# Patient Record
Sex: Female | Born: 1945
Health system: Southern US, Community
[De-identification: ages and names within clinical notes are randomized; demographics above are authoritative.]

## PROBLEM LIST (undated history)

## (undated) DIAGNOSIS — R51 Headache: Secondary | ICD-10-CM

## (undated) DIAGNOSIS — M1990 Unspecified osteoarthritis, unspecified site: Secondary | ICD-10-CM

## (undated) DIAGNOSIS — J019 Acute sinusitis, unspecified: Secondary | ICD-10-CM

## (undated) DIAGNOSIS — N951 Menopausal and female climacteric states: Secondary | ICD-10-CM

## (undated) DIAGNOSIS — R7302 Impaired glucose tolerance (oral): Secondary | ICD-10-CM

## (undated) DIAGNOSIS — M810 Age-related osteoporosis without current pathological fracture: Secondary | ICD-10-CM

## (undated) DIAGNOSIS — Z8679 Personal history of other diseases of the circulatory system: Secondary | ICD-10-CM

## (undated) DIAGNOSIS — Z86010 Personal history of colonic polyps: Secondary | ICD-10-CM

## (undated) DIAGNOSIS — F341 Dysthymic disorder: Secondary | ICD-10-CM

## (undated) DIAGNOSIS — H538 Other visual disturbances: Secondary | ICD-10-CM

## (undated) DIAGNOSIS — E785 Hyperlipidemia, unspecified: Secondary | ICD-10-CM

## (undated) DIAGNOSIS — B0239 Other herpes zoster eye disease: Secondary | ICD-10-CM

## (undated) DIAGNOSIS — T884XXA Failed or difficult intubation, initial encounter: Secondary | ICD-10-CM

## (undated) DIAGNOSIS — E7800 Pure hypercholesterolemia, unspecified: Secondary | ICD-10-CM

## (undated) DIAGNOSIS — J309 Allergic rhinitis, unspecified: Secondary | ICD-10-CM

## (undated) DIAGNOSIS — F419 Anxiety disorder, unspecified: Secondary | ICD-10-CM

## (undated) DIAGNOSIS — H409 Unspecified glaucoma: Secondary | ICD-10-CM

## (undated) DIAGNOSIS — E739 Lactose intolerance, unspecified: Secondary | ICD-10-CM

## (undated) HISTORY — DX: Hyperlipidemia, unspecified: E78.5

## (undated) HISTORY — DX: Headache: R51

## (undated) HISTORY — DX: Allergic rhinitis, unspecified: J30.9

## (undated) HISTORY — DX: Failed or difficult intubation, initial encounter: T88.4XXA

## (undated) HISTORY — DX: Unspecified osteoarthritis, unspecified site: M19.90

## (undated) HISTORY — DX: Pure hypercholesterolemia, unspecified: E78.00

## (undated) HISTORY — DX: Unspecified glaucoma: H40.9

## (undated) HISTORY — DX: Anxiety disorder, unspecified: F41.9

## (undated) HISTORY — DX: Menopausal and female climacteric states: N95.1

## (undated) HISTORY — DX: Personal history of other diseases of the circulatory system: Z86.79

## (undated) HISTORY — DX: Impaired glucose tolerance (oral): R73.02

## (undated) HISTORY — DX: Other herpes zoster eye disease: B02.39

## (undated) HISTORY — DX: Dysthymic disorder: F34.1

## (undated) HISTORY — PX: COLONOSCOPY: SHX174

## (undated) HISTORY — PX: ABDOMINAL HYSTERECTOMY: SHX81

## (undated) HISTORY — DX: Personal history of colonic polyps: Z86.010

## (undated) HISTORY — DX: Other visual disturbances: H53.8

## (undated) HISTORY — PX: POLYPECTOMY: SHX149

## (undated) HISTORY — PX: OOPHORECTOMY: SHX86

## (undated) HISTORY — DX: Age-related osteoporosis without current pathological fracture: M81.0

## (undated) HISTORY — DX: Acute sinusitis, unspecified: J01.90

## (undated) HISTORY — PX: COSMETIC SURGERY: SHX468

## (undated) HISTORY — DX: Lactose intolerance, unspecified: E73.9

## (undated) HISTORY — PX: AUGMENTATION MAMMAPLASTY: SUR837

---

## 1998-05-07 ENCOUNTER — Other Ambulatory Visit: Admission: RE | Admit: 1998-05-07 | Discharge: 1998-05-07 | Payer: Self-pay | Admitting: Obstetrics and Gynecology

## 1998-08-25 ENCOUNTER — Ambulatory Visit: Admission: RE | Admit: 1998-08-25 | Discharge: 1998-08-25 | Payer: Self-pay | Admitting: Internal Medicine

## 1998-10-23 ENCOUNTER — Ambulatory Visit: Admission: RE | Admit: 1998-10-23 | Discharge: 1998-10-23 | Payer: Self-pay | Admitting: Internal Medicine

## 1998-10-23 ENCOUNTER — Encounter: Payer: Self-pay | Admitting: Internal Medicine

## 1999-06-26 ENCOUNTER — Other Ambulatory Visit: Admission: RE | Admit: 1999-06-26 | Discharge: 1999-06-26 | Payer: Self-pay | Admitting: Obstetrics and Gynecology

## 2000-03-23 ENCOUNTER — Encounter: Payer: Self-pay | Admitting: Internal Medicine

## 2000-03-23 ENCOUNTER — Encounter: Admission: RE | Admit: 2000-03-23 | Discharge: 2000-03-23 | Payer: Self-pay | Admitting: Internal Medicine

## 2000-05-24 ENCOUNTER — Encounter: Payer: Self-pay | Admitting: Emergency Medicine

## 2000-05-24 ENCOUNTER — Emergency Department (HOSPITAL_COMMUNITY): Admission: EM | Admit: 2000-05-24 | Discharge: 2000-05-24 | Payer: Self-pay | Admitting: Emergency Medicine

## 2000-07-14 ENCOUNTER — Encounter: Admission: RE | Admit: 2000-07-14 | Discharge: 2000-10-12 | Payer: Self-pay | Admitting: Orthopedic Surgery

## 2000-08-31 ENCOUNTER — Other Ambulatory Visit: Admission: RE | Admit: 2000-08-31 | Discharge: 2000-08-31 | Payer: Self-pay | Admitting: *Deleted

## 2000-10-13 ENCOUNTER — Encounter: Admission: RE | Admit: 2000-10-13 | Discharge: 2000-11-15 | Payer: Self-pay | Admitting: Orthopedic Surgery

## 2000-11-28 ENCOUNTER — Encounter: Admission: RE | Admit: 2000-11-28 | Discharge: 2001-01-25 | Payer: Self-pay | Admitting: Orthopedic Surgery

## 2001-04-20 ENCOUNTER — Encounter: Payer: Self-pay | Admitting: Obstetrics and Gynecology

## 2001-04-20 ENCOUNTER — Encounter: Admission: RE | Admit: 2001-04-20 | Discharge: 2001-04-20 | Payer: Self-pay | Admitting: Obstetrics and Gynecology

## 2001-11-10 ENCOUNTER — Other Ambulatory Visit: Admission: RE | Admit: 2001-11-10 | Discharge: 2001-11-10 | Payer: Self-pay | Admitting: Obstetrics and Gynecology

## 2001-12-24 ENCOUNTER — Encounter: Payer: Self-pay | Admitting: Emergency Medicine

## 2001-12-24 ENCOUNTER — Emergency Department (HOSPITAL_COMMUNITY): Admission: EM | Admit: 2001-12-24 | Discharge: 2001-12-24 | Payer: Self-pay | Admitting: Emergency Medicine

## 2002-08-30 ENCOUNTER — Encounter: Admission: RE | Admit: 2002-08-30 | Discharge: 2002-08-30 | Payer: Self-pay | Admitting: Obstetrics and Gynecology

## 2002-08-30 ENCOUNTER — Encounter: Payer: Self-pay | Admitting: Obstetrics and Gynecology

## 2002-10-13 ENCOUNTER — Encounter: Payer: Self-pay | Admitting: Internal Medicine

## 2002-10-13 ENCOUNTER — Encounter: Admission: RE | Admit: 2002-10-13 | Discharge: 2002-10-13 | Payer: Self-pay | Admitting: Internal Medicine

## 2002-11-27 ENCOUNTER — Other Ambulatory Visit: Admission: RE | Admit: 2002-11-27 | Discharge: 2002-11-27 | Payer: Self-pay | Admitting: Obstetrics and Gynecology

## 2003-09-02 ENCOUNTER — Encounter: Admission: RE | Admit: 2003-09-02 | Discharge: 2003-09-02 | Payer: Self-pay | Admitting: Obstetrics and Gynecology

## 2003-09-02 ENCOUNTER — Encounter: Payer: Self-pay | Admitting: Obstetrics and Gynecology

## 2004-01-13 ENCOUNTER — Other Ambulatory Visit: Admission: RE | Admit: 2004-01-13 | Discharge: 2004-01-13 | Payer: Self-pay | Admitting: Obstetrics and Gynecology

## 2004-12-01 ENCOUNTER — Ambulatory Visit: Payer: Self-pay | Admitting: Internal Medicine

## 2005-02-09 ENCOUNTER — Other Ambulatory Visit: Admission: RE | Admit: 2005-02-09 | Discharge: 2005-02-09 | Payer: Self-pay | Admitting: Obstetrics and Gynecology

## 2005-02-09 ENCOUNTER — Ambulatory Visit (HOSPITAL_COMMUNITY): Admission: RE | Admit: 2005-02-09 | Discharge: 2005-02-09 | Payer: Self-pay | Admitting: Obstetrics and Gynecology

## 2005-02-15 ENCOUNTER — Ambulatory Visit (HOSPITAL_COMMUNITY): Admission: RE | Admit: 2005-02-15 | Discharge: 2005-02-15 | Payer: Self-pay | Admitting: Obstetrics and Gynecology

## 2005-05-13 ENCOUNTER — Ambulatory Visit: Payer: Self-pay | Admitting: Internal Medicine

## 2005-09-09 ENCOUNTER — Ambulatory Visit: Payer: Self-pay | Admitting: Internal Medicine

## 2005-09-14 ENCOUNTER — Ambulatory Visit: Payer: Self-pay | Admitting: Cardiology

## 2005-11-08 HISTORY — PX: DILATION AND CURETTAGE OF UTERUS: SHX78

## 2005-12-07 ENCOUNTER — Ambulatory Visit (HOSPITAL_COMMUNITY): Admission: RE | Admit: 2005-12-07 | Discharge: 2005-12-07 | Payer: Self-pay | Admitting: Obstetrics and Gynecology

## 2005-12-07 ENCOUNTER — Encounter (INDEPENDENT_AMBULATORY_CARE_PROVIDER_SITE_OTHER): Payer: Self-pay | Admitting: *Deleted

## 2006-01-04 ENCOUNTER — Ambulatory Visit: Payer: Self-pay | Admitting: Internal Medicine

## 2006-01-18 ENCOUNTER — Ambulatory Visit: Payer: Self-pay | Admitting: Internal Medicine

## 2006-01-18 ENCOUNTER — Encounter (INDEPENDENT_AMBULATORY_CARE_PROVIDER_SITE_OTHER): Payer: Self-pay | Admitting: Specialist

## 2006-02-10 ENCOUNTER — Ambulatory Visit (HOSPITAL_COMMUNITY): Admission: RE | Admit: 2006-02-10 | Discharge: 2006-02-10 | Payer: Self-pay | Admitting: Obstetrics and Gynecology

## 2006-02-18 ENCOUNTER — Other Ambulatory Visit: Admission: RE | Admit: 2006-02-18 | Discharge: 2006-02-18 | Payer: Self-pay | Admitting: Obstetrics and Gynecology

## 2006-04-20 ENCOUNTER — Ambulatory Visit (HOSPITAL_COMMUNITY): Admission: RE | Admit: 2006-04-20 | Discharge: 2006-04-21 | Payer: Self-pay | Admitting: Obstetrics and Gynecology

## 2006-04-20 ENCOUNTER — Encounter (INDEPENDENT_AMBULATORY_CARE_PROVIDER_SITE_OTHER): Payer: Self-pay | Admitting: Specialist

## 2006-06-01 ENCOUNTER — Ambulatory Visit (HOSPITAL_COMMUNITY): Admission: RE | Admit: 2006-06-01 | Discharge: 2006-06-01 | Payer: Self-pay | Admitting: Internal Medicine

## 2006-06-01 ENCOUNTER — Ambulatory Visit: Payer: Self-pay | Admitting: Internal Medicine

## 2007-01-18 ENCOUNTER — Ambulatory Visit: Payer: Self-pay | Admitting: Internal Medicine

## 2007-02-28 ENCOUNTER — Ambulatory Visit (HOSPITAL_COMMUNITY): Admission: RE | Admit: 2007-02-28 | Discharge: 2007-02-28 | Payer: Self-pay | Admitting: Obstetrics and Gynecology

## 2007-08-21 ENCOUNTER — Ambulatory Visit (HOSPITAL_COMMUNITY): Admission: RE | Admit: 2007-08-21 | Discharge: 2007-08-21 | Payer: Self-pay | Admitting: Obstetrics and Gynecology

## 2007-08-30 ENCOUNTER — Encounter: Payer: Self-pay | Admitting: *Deleted

## 2007-08-30 DIAGNOSIS — Z8679 Personal history of other diseases of the circulatory system: Secondary | ICD-10-CM | POA: Insufficient documentation

## 2007-08-30 DIAGNOSIS — F341 Dysthymic disorder: Secondary | ICD-10-CM | POA: Insufficient documentation

## 2007-08-30 DIAGNOSIS — N951 Menopausal and female climacteric states: Secondary | ICD-10-CM

## 2007-08-30 DIAGNOSIS — E7800 Pure hypercholesterolemia, unspecified: Secondary | ICD-10-CM | POA: Insufficient documentation

## 2007-08-30 HISTORY — DX: Dysthymic disorder: F34.1

## 2007-08-30 HISTORY — DX: Menopausal and female climacteric states: N95.1

## 2007-08-30 HISTORY — DX: Personal history of other diseases of the circulatory system: Z86.79

## 2007-08-30 HISTORY — DX: Pure hypercholesterolemia, unspecified: E78.00

## 2007-12-22 ENCOUNTER — Encounter: Payer: Self-pay | Admitting: Internal Medicine

## 2007-12-22 ENCOUNTER — Ambulatory Visit: Payer: Self-pay | Admitting: Internal Medicine

## 2007-12-22 DIAGNOSIS — R51 Headache: Secondary | ICD-10-CM

## 2007-12-22 DIAGNOSIS — Z860100 Personal history of colon polyps, unspecified: Secondary | ICD-10-CM

## 2007-12-22 DIAGNOSIS — M810 Age-related osteoporosis without current pathological fracture: Secondary | ICD-10-CM

## 2007-12-22 DIAGNOSIS — J309 Allergic rhinitis, unspecified: Secondary | ICD-10-CM | POA: Insufficient documentation

## 2007-12-22 DIAGNOSIS — H539 Unspecified visual disturbance: Secondary | ICD-10-CM | POA: Insufficient documentation

## 2007-12-22 DIAGNOSIS — H538 Other visual disturbances: Secondary | ICD-10-CM | POA: Insufficient documentation

## 2007-12-22 DIAGNOSIS — R519 Headache, unspecified: Secondary | ICD-10-CM | POA: Insufficient documentation

## 2007-12-22 HISTORY — DX: Headache: R51

## 2007-12-22 HISTORY — DX: Other visual disturbances: H53.8

## 2007-12-22 HISTORY — DX: Personal history of colon polyps, unspecified: Z86.0100

## 2007-12-22 HISTORY — DX: Allergic rhinitis, unspecified: J30.9

## 2007-12-22 HISTORY — DX: Personal history of colonic polyps: Z86.010

## 2007-12-22 HISTORY — DX: Age-related osteoporosis without current pathological fracture: M81.0

## 2007-12-30 ENCOUNTER — Encounter: Admission: RE | Admit: 2007-12-30 | Discharge: 2007-12-30 | Payer: Self-pay | Admitting: Internal Medicine

## 2008-03-19 ENCOUNTER — Ambulatory Visit: Payer: Self-pay | Admitting: Internal Medicine

## 2008-03-19 ENCOUNTER — Ambulatory Visit (HOSPITAL_COMMUNITY): Admission: RE | Admit: 2008-03-19 | Discharge: 2008-03-19 | Payer: Self-pay | Admitting: Obstetrics and Gynecology

## 2008-03-19 LAB — CONVERTED CEMR LAB
Albumin: 3.5 g/dL (ref 3.5–5.2)
Alkaline Phosphatase: 46 units/L (ref 39–117)
Basophils Absolute: 0.1 10*3/uL (ref 0.0–0.1)
CO2: 30 meq/L (ref 19–32)
Calcium: 9 mg/dL (ref 8.4–10.5)
Chloride: 107 meq/L (ref 96–112)
Direct LDL: 118.2 mg/dL
Eosinophils Absolute: 0.1 10*3/uL (ref 0.0–0.7)
GFR calc Af Amer: 110 mL/min
Glucose, Bld: 94 mg/dL (ref 70–99)
HCT: 37.5 % (ref 36.0–46.0)
Hemoglobin, Urine: NEGATIVE
Lymphocytes Relative: 34.1 % (ref 12.0–46.0)
Monocytes Absolute: 0.4 10*3/uL (ref 0.1–1.0)
Neutro Abs: 2.3 10*3/uL (ref 1.4–7.7)
Platelets: 293 10*3/uL (ref 150–400)
Potassium: 5.5 meq/L — ABNORMAL HIGH (ref 3.5–5.1)
RDW: 12.5 % (ref 11.5–14.6)
Specific Gravity, Urine: 1.025 (ref 1.000–1.03)
TSH: 3.9 microintl units/mL (ref 0.35–5.50)
Total Protein: 6.7 g/dL (ref 6.0–8.3)
VLDL: 13 mg/dL (ref 0–40)
WBC: 4.4 10*3/uL — ABNORMAL LOW (ref 4.5–10.5)
pH: 6 (ref 5.0–8.0)

## 2008-03-26 ENCOUNTER — Ambulatory Visit: Payer: Self-pay | Admitting: Internal Medicine

## 2008-03-26 DIAGNOSIS — E785 Hyperlipidemia, unspecified: Secondary | ICD-10-CM

## 2008-03-26 HISTORY — DX: Hyperlipidemia, unspecified: E78.5

## 2008-06-17 ENCOUNTER — Ambulatory Visit: Payer: Self-pay | Admitting: Internal Medicine

## 2008-12-19 ENCOUNTER — Telehealth (INDEPENDENT_AMBULATORY_CARE_PROVIDER_SITE_OTHER): Payer: Self-pay | Admitting: *Deleted

## 2009-03-05 ENCOUNTER — Encounter: Payer: Self-pay | Admitting: Internal Medicine

## 2009-03-25 ENCOUNTER — Ambulatory Visit (HOSPITAL_COMMUNITY): Admission: RE | Admit: 2009-03-25 | Discharge: 2009-03-25 | Payer: Self-pay | Admitting: Obstetrics and Gynecology

## 2009-04-10 ENCOUNTER — Encounter: Payer: Self-pay | Admitting: Internal Medicine

## 2009-07-08 ENCOUNTER — Ambulatory Visit: Payer: Self-pay | Admitting: Internal Medicine

## 2009-07-08 DIAGNOSIS — E739 Lactose intolerance, unspecified: Secondary | ICD-10-CM | POA: Insufficient documentation

## 2009-07-08 HISTORY — DX: Lactose intolerance, unspecified: E73.9

## 2009-09-10 ENCOUNTER — Encounter: Admission: RE | Admit: 2009-09-10 | Discharge: 2009-11-05 | Payer: Self-pay | Admitting: Internal Medicine

## 2009-10-14 ENCOUNTER — Encounter: Payer: Self-pay | Admitting: Internal Medicine

## 2009-11-17 ENCOUNTER — Ambulatory Visit: Payer: Self-pay | Admitting: Internal Medicine

## 2009-11-17 LAB — CONVERTED CEMR LAB
ALT: 15 units/L (ref 0–35)
Albumin: 3.6 g/dL (ref 3.5–5.2)
Bilirubin Urine: NEGATIVE
Cholesterol: 213 mg/dL — ABNORMAL HIGH (ref 0–200)
Direct LDL: 117.5 mg/dL
Eosinophils Relative: 1.4 % (ref 0.0–5.0)
GFR calc non Af Amer: 90.35 mL/min (ref >60)
Hemoglobin, Urine: NEGATIVE
Leukocytes, UA: NEGATIVE
Lymphocytes Relative: 28.4 % (ref 12.0–46.0)
MCHC: 34.2 g/dL (ref 30.0–36.0)
MCV: 103.5 fL — ABNORMAL HIGH (ref 78.0–100.0)
Monocytes Absolute: 0.4 10*3/uL (ref 0.1–1.0)
Monocytes Relative: 8 % (ref 3.0–12.0)
Neutrophils Relative %: 61.3 % (ref 43.0–77.0)
Platelets: 276 10*3/uL (ref 150.0–400.0)
Potassium: 4.5 meq/L (ref 3.5–5.1)
RBC: 3.67 M/uL — ABNORMAL LOW (ref 3.87–5.11)
RDW: 12.1 % (ref 11.5–14.6)
Sodium: 139 meq/L (ref 135–145)
Specific Gravity, Urine: 1.015 (ref 1.000–1.030)
Total CHOL/HDL Ratio: 3
Total Protein, Urine: NEGATIVE mg/dL
Total Protein: 6.7 g/dL (ref 6.0–8.3)
Urine Glucose: NEGATIVE mg/dL
VLDL: 19 mg/dL (ref 0.0–40.0)
pH: 7 (ref 5.0–8.0)

## 2010-02-06 ENCOUNTER — Ambulatory Visit: Payer: Self-pay | Admitting: Internal Medicine

## 2010-02-06 DIAGNOSIS — J019 Acute sinusitis, unspecified: Secondary | ICD-10-CM

## 2010-02-06 HISTORY — DX: Acute sinusitis, unspecified: J01.90

## 2010-03-31 ENCOUNTER — Ambulatory Visit (HOSPITAL_COMMUNITY): Admission: RE | Admit: 2010-03-31 | Discharge: 2010-03-31 | Payer: Self-pay | Admitting: Obstetrics and Gynecology

## 2010-09-07 ENCOUNTER — Telehealth: Payer: Self-pay | Admitting: Internal Medicine

## 2010-12-08 NOTE — Assessment & Plan Note (Signed)
Summary: ?laryngitis/headache,stuffy head/cd   Vital Signs:  Patient profile:   65 year old female Height:      63.5 inches Weight:      124 pounds BMI:     21.70 O2 Sat:      98 % on Room air Temp:     98.6 degrees F oral Pulse rate:   84 / minute BP sitting:   110 / 64  (left arm) Cuff size:   regular  Vitals Entered ByZella Ball Ewing (February 06, 2010 11:29 AM)  O2 Flow:  Room air CC: Headache, nasal congestion, Hoarseness/RE   CC:  Headache, nasal congestion, and Hoarseness/RE.  History of Present Illness: here with acute onset mild to mod fever, ST, sinus pain and pressure, greenish d/c for 3 days; some mild cough nonprod but Pt denies CP, sob, doe, wheezing, orthopnea, pnd, worsening LE edema, palps, dizziness or syncope   Problems Prior to Update: 1)  Sinusitis- Acute-nos  (ICD-461.9) 2)  Glucose Intolerance  (ICD-271.3) 3)  Hyperlipidemia  (ICD-272.4) 4)  Preventive Health Care  (ICD-V70.0) 5)  Colonic Polyps, Hx of  (ICD-V12.72) 6)  Allergic Rhinitis  (ICD-477.9) 7)  Osteoporosis  (ICD-733.00) 8)  Blurred Vision, Intermittent  (ICD-368.8) 9)  Headache  (ICD-784.0) 10)  Hypercholesterolemia  (ICD-272.0) 11)  Postmenopausal Status  (ICD-627.2) 12)  Mitral Valve Prolapse, Hx of  (ICD-V12.50) 13)  Anxiety Depression  (ICD-300.4)  Medications Prior to Update: 1)  Alprazolam 0.5 Mg Tabs (Alprazolam) .... Take 1 Tablet By Mouth Twice A Day As Needed 2)  Citalopram Hydrobromide 40 Mg Tabs (Citalopram Hydrobromide) .... Take 1 Tablet By Mouth Once A Day 3)  Premarin 0.3 Mg Tabs (Estrogens Conjugated) .Marland Kitchen.. 1 By Mouth Once Daily  Current Medications (verified): 1)  Alprazolam 0.5 Mg Tabs (Alprazolam) .... Take 1 Tablet By Mouth Twice A Day As Needed 2)  Citalopram Hydrobromide 40 Mg Tabs (Citalopram Hydrobromide) .... Take 1 Tablet By Mouth Once A Day 3)  Premarin 0.3 Mg Tabs (Estrogens Conjugated) .Marland Kitchen.. 1 By Mouth Once Daily 4)  Clarithromycin 500 Mg Tabs (Clarithromycin)  .Marland Kitchen.. 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 , mild ill  Head:  normocephalic and atraumatic.   Eyes:  vision grossly intact, pupils equal, and pupils round.   Ears:  bilat tm's red , sinus marked tender bilat Nose:  nasal dischargemucosal pallor and mucosal edema.   Mouth:  pharyngeal erythema and fair dentition.   Neck:  supple and no masses.   Lungs:  normal respiratory effort and normal breath sounds.   Heart:  normal rate and regular rhythm.   Extremities:  no edema, no erythema    Impression & Recommendations:  Problem # 1:  SINUSITIS- ACUTE-NOS (ICD-461.9)  Her updated medication list for this problem includes:    Clarithromycin 500 Mg Tabs (Clarithromycin) .Marland Kitchen... 1po two times a day treat as above, f/u any worsening signs or symptoms  Complete Medication List: 1)  Alprazolam 0.5 Mg Tabs (Alprazolam) .... Take 1 tablet by mouth twice a day as needed 2)  Citalopram Hydrobromide 40 Mg Tabs (Citalopram hydrobromide) .... Take 1 tablet by mouth once a day 3)  Premarin 0.3 Mg Tabs (Estrogens conjugated) .Marland Kitchen.. 1 by mouth once daily 4)  Clarithromycin 500 Mg Tabs (Clarithromycin) .Marland Kitchen.. 1po two times a day  Patient Instructions: 1)  Please take all new medications as prescribed  2)  Continue all previous  medications as before this visit  3)  You can also use Mucinex OTC or it's generic for congestion  4)  You can also take delsym OTC as needed  5)  Please schedule a follow-up appointment as needed. Prescriptions: CLARITHROMYCIN 500 MG TABS (CLARITHROMYCIN) 1po two times a day  #20 x 0   Entered and Authorized by:   Corwin Levins MD   Signed by:   Corwin Levins MD on 02/06/2010   Method used:   Print then Give to Patient   RxID:   [id number]

## 2010-12-08 NOTE — Assessment & Plan Note (Signed)
Summary: 4 mos f/u $50 cd   Vital Signs:  Patient profile:   65 year old female Height:      64 inches Weight:      122 pounds BMI:     21.02 O2 Sat:      99 % on Room air Temp:     97.6 degrees F oral Pulse rate:   64 / minute BP sitting:   112 / 70  (left arm) Cuff size:   regular  Vitals Entered ByZella Ball Ewing (November 17, 2009 8:15 AM)  O2 Flow:  Room air  CC: 4 Mo followup/Re   CC:  4 Mo followup/Re.  History of Present Illness: did go to dietary clinic and doing much better wit diet;  lost 6 lbs intendtionally over the past year but now stabilized it seems;  sugars at home have been in the lower 100's;  watched her diet carefully over the holidays;  Pt denies polydipsia, polyuria, or low sugar symptoms such as shakiness improved with eating.  Overall good compliance with meds, trying to follow low chol, DM diet, wt stable, little excercise however .  Pt denies CP, sob, doe, wheezing, orthopnea, pnd, worsening LE edema, palps, dizziness or syncope   Pt denies new neuro symptoms such as headache, facial or extremity weakness  More stress  at work [employer].  Had some GI upset better with pepto bismoml but no other problems.    Problems Prior to Update: 1)  Glucose Intolerance  (ICD-271.3) 2)  Hyperlipidemia  (ICD-272.4) 3)  Preventive Health Care  (ICD-V70.0) 4)  Colonic Polyps, Hx of  (ICD-V12.72) 5)  Allergic Rhinitis  (ICD-477.9) 6)  Osteoporosis  (ICD-733.00) 7)  Blurred Vision, Intermittent  (ICD-368.8) 8)  Headache  (ICD-784.0) 9)  Hypercholesterolemia  (ICD-272.0) 10)  Postmenopausal Status  (ICD-627.2) 11)  Mitral Valve Prolapse, Hx of  (ICD-V12.50) 12)  Anxiety Depression  (ICD-300.4)  Medications Prior to Update: 1)  Alprazolam 0.5 Mg Tabs (Alprazolam) .... Take 1 Tablet By Mouth Twice A Day As Needed 2)  Citalopram Hydrobromide 40 Mg Tabs (Citalopram Hydrobromide) .... Take 1 Tablet By Mouth Once A Day 3)  Premarin 0.3 Mg Tabs (Estrogens Conjugated) .Marland Kitchen.. 1  By Mouth Once Daily  Current Medications (verified): 1)  Alprazolam 0.5 Mg Tabs (Alprazolam) .... Take 1 Tablet By Mouth Twice A Day As Needed 2)  Citalopram Hydrobromide 40 Mg Tabs (Citalopram Hydrobromide) .... Take 1 Tablet By Mouth Once A Day 3)  Premarin 0.3 Mg Tabs (Estrogens Conjugated) .Marland Kitchen.. 1 By Mouth Once Daily  Allergies (verified): No Known Drug Allergies  Past History:  Past Medical History: Last updated: 07/08/2009 HYPERCHOLESTEROLEMIA (ICD-272.0) POSTMENOPAUSAL STATUS (ICD-627.2) MITRAL VALVE PROLAPSE, HX OF (ICD-V12.50) ANXIETY DEPRESSION (ICD-300.4) Osteoporosis Allergic rhinitis Colonic polyps, hx of glucose intolerance  Past Surgical History: Last updated: 03/26/2008 s/p D&C 2007 Hysterectomy Oophorectomy  Family History: Last updated: 07/08/2009 mother with heart disease grandmother with breast cancer mother with DM, elev cholesterol father with alcoholism son with testicular cancer sister with breast cancer  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         all otherwise negative per pt - 12 system review   Physical Exam  General:  alert and well-developed.   Head:  normocephalic and  atraumatic.   Eyes:  vision grossly intact, pupils equal, and pupils round.   Ears:  R ear normal and L ear normal.   Nose:  no external deformity and no nasal discharge.   Mouth:  no gingival abnormalities and pharynx pink and moist.   Neck:  supple and no masses.   Lungs:  normal respiratory effort and normal breath sounds.   Heart:  normal rate and regular rhythm.   Abdomen:  soft, non-tender, and normal bowel sounds.   Msk:  no joint tenderness and no joint swelling.   Extremities:  no edema, no erythema  Neurologic:  cranial nerves II-XII intact and strength normal in all extremities.     Impression & Recommendations:  Problem # 1:  PREVENTIVE HEALTH CARE (ICD-V70.0) Overall doing well, up to date, counseled on routine health concerns for screening and prevention, immunizations up to date or declined, labs ordered. due for tetanu shot today Orders: TLB-BMP (Basic Metabolic Panel-BMET) (80048-METABOL) TLB-CBC Platelet - w/Differential (85025-CBCD) TLB-Hepatic/Liver Function Pnl (80076-HEPATIC) TLB-Lipid Panel (80061-LIPID) TLB-TSH (Thyroid Stimulating Hormone) (84443-TSH) TLB-Udip ONLY (81003-UDIP)  Problem # 2:  GLUCOSE INTOLERANCE (ICD-271.3)  Orders: TLB-A1C / Hgb A1C (Glycohemoglobin) (83036-A1C) asympt - for a1c check today  Problem # 3:  OSTEOPOROSIS (ICD-733.00)  Orders: T-Vitamin D (25-Hydroxy) ([address]) T-Parathyroid Hormone, Intact w/ Calcium (09811-91478) to check labs, due for dxa soon as well  Problem # 4:  ANXIETY DEPRESSION (ICD-300.4) stable overall by hx and exam, ok to continue meds/tx as is   Complete Medication List: 1)  Alprazolam 0.5 Mg Tabs (Alprazolam) .... Take 1 tablet by mouth twice a day as needed 2)  Citalopram Hydrobromide 40 Mg Tabs (Citalopram hydrobromide) .... Take 1 tablet by mouth once a day 3)  Premarin 0.3 Mg Tabs (Estrogens conjugated) .Marland Kitchen.. 1 by mouth once daily  Other Orders: Tdap => 20yrs IM (29562) Admin 1st Vaccine  (13086)  Patient Instructions: 1)  you had the tetanus shot today 2)  Please go to the Lab in the basement for your blood and/or urine tests today 3)  Continue all previous medications as before this visit  4)  Please schedule a follow-up appointment in 1 year or sooner if needed Prescriptions: ALPRAZOLAM 0.5 MG TABS (ALPRAZOLAM) Take 1 tablet by mouth twice a day as needed  #60 x 5   Entered and Authorized by:   Corwin Levins MD   Signed by:   Corwin Levins MD on 11/17/2009   Method used:   Print then Give to Patient   RxID:   [id number] CITALOPRAM HYDROBROMIDE 40 MG TABS (CITALOPRAM HYDROBROMIDE) Take 1 tablet by mouth once a day  #90 x 3   Entered and Authorized by:   Corwin Levins MD   Signed by:   Corwin Levins MD on 11/17/2009   Method used:   Print then Give to Patient   RxID:   [id number]    Immunizations Administered:  Tetanus Vaccine:    Vaccine Type: Tdap    Site: right deltoid    Mfr: GlaxoSmithKline    Dose: 0.5 ml    Route: IM    Given by: Robin Ewing    Exp. Date:  01/03/2012    Lot #: [id number]    VIS given: 09/26/07 version given November 17, 2009.   Immunizations Administered:  Tetanus Vaccine:    Vaccine Type: Tdap    Site: right deltoid    Mfr: GlaxoSmithKline    Dose: 0.5 ml    Route: IM    Given by: Robin Ewing    Exp. Date: 01/03/2012    Lot #: [id number]    VIS given: 09/26/07 version given November 17, 2009.

## 2010-12-08 NOTE — Letter (Signed)
Summary: [facility]  [address] [place], [place] [address]   Phone: [phone]  Fax: [phone]    Today's Date: February 06, 2010  Name of Patient: Cindy Higgins  The above named patient had a medical visit today at:  11 am   Please take this into consideration when reviewing the time away from work/school.    Special Instructions:  Arly.Keller  ] None  [ X ] To be off the remainder of today, returning to the normal work / school schedule tomorrow.  [  ] To be off until the next scheduled appointment on ______________________.  [  ] Other ________________________________________________________________ ________________________________________________________________________   Sincerely yours,   Corwin Levins MD

## 2010-12-08 NOTE — Progress Notes (Signed)
Summary: medication refill  Phone Note Refill Request Message from:  Fax from Pharmacy on September 07, 2010 9:11 AM  Refills Requested: Medication #1:  ALPRAZOLAM 0.5 MG TABS Take 1 tablet by mouth twice a day as needed   Dosage confirmed as above?Dosage Confirmed   Last Refilled: 11/17/2009   Notes: [facility] [address] [phone] Initial call taken by: Robin Ewing CMA (AAMA),  September 07, 2010 9:11 AM    New/Updated Medications: ALPRAZOLAM 0.5 MG TABS (ALPRAZOLAM) Take 1 tablet by mouth twice a day as needed Prescriptions: ALPRAZOLAM 0.5 MG TABS (ALPRAZOLAM) Take 1 tablet by mouth twice a day as needed  #60 x 5   Entered and Authorized by:   Corwin Levins MD   Signed by:   Corwin Levins MD on 09/07/2010   Method used:   Print then Give to Patient   RxID:   [id number]  done hardcopy to LIM side B - dahlia  Corwin Levins MD  September 07, 2010 1:02 PM   Rx faxed to pharmacy Margaret Pyle, CMA  September 07, 2010 1:30 PM

## 2010-12-24 ENCOUNTER — Ambulatory Visit (INDEPENDENT_AMBULATORY_CARE_PROVIDER_SITE_OTHER): Payer: BC Managed Care – PPO | Admitting: Internal Medicine

## 2010-12-24 ENCOUNTER — Encounter: Payer: Self-pay | Admitting: Internal Medicine

## 2010-12-24 DIAGNOSIS — M79609 Pain in unspecified limb: Secondary | ICD-10-CM

## 2010-12-24 DIAGNOSIS — J019 Acute sinusitis, unspecified: Secondary | ICD-10-CM

## 2010-12-24 DIAGNOSIS — F341 Dysthymic disorder: Secondary | ICD-10-CM

## 2010-12-24 DIAGNOSIS — J069 Acute upper respiratory infection, unspecified: Secondary | ICD-10-CM | POA: Insufficient documentation

## 2010-12-24 DIAGNOSIS — E739 Lactose intolerance, unspecified: Secondary | ICD-10-CM

## 2010-12-30 NOTE — Assessment & Plan Note (Signed)
Summary: left arm, painful to lift arm   Vital Signs:  Patient profile:   65 year old female Height:      65 inches Weight:      127.25 pounds BMI:     21.25 O2 Sat:      98 % on Room air Temp:     97.8 degrees F oral Pulse rate:   75 / minute BP sitting:   128 / 82  (left arm)  Vitals Entered By: Zella Ball Ewing CMA Duncan Dull) (December 24, 2010 2:49 PM)  O2 Flow:  Room air CC: Left arm pain/RE   CC:  Left arm pain/RE.  History of Present Illness: here with acute onset pain to the LUE/prox arm , mostly at the distal deltoid insertion area to the humerus, and worse to abduct  adn forward elevate , and has reduced ROM on doing so today.  No neck, upper back, shoulder , elbow or other pain, swelling, numbness.  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 Husband with recent hip surgury but she has not been required to lift or other do much physically to care for him.    Pt denies CP, worsening sob, doe, wheezing, orthopnea, pnd, worsening LE edema, palps, dizziness or syncope  Pt denies new neuro symptoms such as headache, facial or extremity weakness  Pt denies polydipsia, polyuria  Overall good compliance with meds, trying to follow low chol  diet, wt stable, little excercise however  Pain mild, intermittent, worse to lie on left side. adn tends to disrupt sleep. Did also have recent URI with midl ST, congestion and cough now mostly resolved.  Denies worsening depressive symptoms, suicidal ideation, or panic, and anxiety overall stable. Overall good compliance with meds, trying to follow low chol diet, wt stable.  Preventive Screening-Counseling & Management      Drug Use:  no.    Problems Prior to Update: 1)  Arm Pain, Left  (ICD-729.5) 2)  Sinusitis- Acute-nos  (ICD-461.9) 3)  Glucose Intolerance   (ICD-271.3) 4)  Hyperlipidemia  (ICD-272.4) 5)  Preventive Health Care  (ICD-V70.0) 6)  Colonic Polyps, Hx of  (ICD-V12.72) 7)  Allergic Rhinitis  (ICD-477.9) 8)  Osteoporosis  (ICD-733.00) 9)  Blurred Vision, Intermittent  (ICD-368.8) 10)  Headache  (ICD-784.0) 11)  Hypercholesterolemia  (ICD-272.0) 12)  Postmenopausal Status  (ICD-627.2) 13)  Mitral Valve Prolapse, Hx of  (ICD-V12.50) 14)  Anxiety Depression  (ICD-300.4)  Medications Prior to Update: 1)  Alprazolam 0.5 Mg Tabs (Alprazolam) .... Take 1 Tablet By Mouth Twice A Day As Needed 2)  Citalopram Hydrobromide 40 Mg Tabs (Citalopram Hydrobromide) .... Take 1 Tablet By Mouth Once A Day 3)  Premarin 0.3 Mg Tabs (Estrogens Conjugated) .Marland Kitchen.. 1 By Mouth Once Daily 4)  Clarithromycin 500 Mg Tabs (Clarithromycin) .Marland Kitchen.. 1po Two Times A Day  Current Medications (verified): 1)  Alprazolam 0.5 Mg Tabs (Alprazolam) .... Take 1 Tablet By Mouth Twice A Day As Needed 2)  Citalopram Hydrobromide 40 Mg Tabs (Citalopram Hydrobromide) .... Take 1 Tablet By Mouth Once A Day 3)  Premarin 0.3 Mg Tabs (Estrogens Conjugated) .Marland Kitchen.. 1 By Mouth Once Daily 4)  Naproxen 500 Mg Tabs (Naproxen) .Marland Kitchen.. 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   Eyes:  vision grossly intact, pupils equal, and pupils round.   Ears:  bilat tm's mild erythema, sinus nontender Nose:  nasal dischargemucosal pallor and mucosal edema.   Mouth:  pharyngeal erythema and fair dentition.   Neck:  supple and no masses.   Lungs:  normal respiratory effort and normal breath sounds.   Heart:  normal rate and regular rhythm.   Msk:  tender at the deltoid /humeral insertion site without swelling, erythema, rash,  no left shoudler bursa tender or impingement sign though she has pain on abduction to 110 degrees Extremities:  no edema, no erythema  Neurologic:  strength normal in all extremities and gait normal.   Skin:  no rashes.   Psych:  not depressed appearing and slightly anxious.     Impression & Recommendations:  Problem # 1:  ARM PAIN, LEFT (ICD-729.5)  c/w deltoid/humeral insertion site tendonitis most likely;  for depomedrol IM today, naproxen as needed ,  no films felt needed at this time, consider ortho Dr Ranell Patrick if persists or worsens  Orders: Depo- Medrol 40mg  (J1030) Admin of Therapeutic Inj  intramuscular or subcutaneous ([address])  Problem # 2:  URI (ICD-465.9)  Her updated medication list for this problem includes:    Naproxen 500 Mg Tabs (Naproxen) .Marland Kitchen... 1po two times a day as needed recent, prob viral , resolving,  no further eval or tx needed  Problem # 3:  GLUCOSE INTOLERANCE (ICD-271.3) asympt - d/w pt - to call for onset polys with tx above, or cbg > 200;  declines labs today  Problem # 4:  ANXIETY DEPRESSION (ICD-300.4) stable overall by hx and exam, ok to continue meds/tx as is   Complete Medication List: 1)  Alprazolam 0.5 Mg Tabs (Alprazolam) .... Take 1 tablet by mouth twice a day as needed 2)  Citalopram Hydrobromide 40 Mg Tabs (Citalopram hydrobromide) .... Take 1 tablet by mouth  once a day 3)  Premarin 0.3 Mg Tabs (Estrogens conjugated) .Marland Kitchen.. 1 by mouth once daily 4)  Naproxen 500 Mg Tabs (Naproxen) .Marland Kitchen.. 1po two times a day as needed  Other Orders: Depo- Medrol 80mg  (J1040)  Patient Instructions: 1)  you had the steroid shot today 2)  Please take all new medications as prescribed 3)  Continue all previous medications as before this visit  4)  Please call if persists for referral to Dr Norris/ortho 5)  Please schedule a follow-up appointment in 4 months for CPX with labs Prescriptions: NAPROXEN 500 MG TABS (NAPROXEN) 1po two times a day as needed  #60 x 1   Entered and Authorized by:   Corwin Levins MD   Signed by:   Corwin Levins MD on 12/24/2010   Method used:   Print then Give to Patient   RxID:   [id number]    Medication Administration  Injection # 1:  Medication: Depo- Medrol 40mg     Diagnosis: ARM PAIN, LEFT (ICD-729.5)    Route: IM    Site: RUOQ gluteus    Exp Date: 02/2013    Lot #: [id number]    Mfr: [employer]    Comments: patient received 120mg  Depo-Medrol    Patient tolerated injection without complications    Given by: Zella Ball Ewing CMA (AAMA) (December 24, 2010 4:08 PM)  Injection # 2:    Medication: Depo- Medrol 80mg     Diagnosis: SINUSITIS- ACUTE-NOS (ICD-461.9)    Route: IM    Site: RUOQ gluteus    Exp Date: 02/2013    Lot #: [id number]    Mfr: [employer]    Given by: Zella Ball Ewing CMA (AAMA) (December 24, 2010 4:08 PM)  Orders Added: 1)  Depo- Medrol 40mg  [J1030] 2)  Depo- Medrol 80mg  [J1040] 3)  Admin of Therapeutic Inj  intramuscular or subcutaneous [96372] 4)  Est. Patient Level IV [04540]

## 2011-01-27 ENCOUNTER — Encounter: Payer: Self-pay | Admitting: Internal Medicine

## 2011-02-04 NOTE — Letter (Signed)
Summary: Colonoscopy Date Change Letter  [facility]  [address] [place], [place] [address]   Phone: [phone]  Fax: [phone]      January 27, 2011 MRN: [id number]   [facility] [address] [place], [place]  [address]   Dear Cindy Higgins,   Previously you were recommended to have a repeat colonoscopy around this time. Your chart was recently reviewed by Dr. Lina Sar of [facility]. Follow up colonoscopy is now recommended in March 2017. This revised recommendation is based on current, nationally recognized guidelines for colorectal cancer screening and polyp surveillance. These guidelines are endorsed by the American Cancer Society, [employer] on Colorectal Cancer as well as numerous other major medical organizations.  Please understand that our recommendation assumes that you do not have any new symptoms such as bleeding, a change in bowel habits, anemia, or significant abdominal discomfort. If you do have any concerning GI symptoms or want to discuss the guideline recommendations, please call to arrange an office visit at your earliest convenience. Otherwise we will keep you in our reminder system and contact you 1-2 months prior to the date listed above to schedule your next colonoscopy.  Thank you,  Cindy Morton. Juanda Chance, M.D.  [facility] [phone]

## 2011-03-22 ENCOUNTER — Other Ambulatory Visit (HOSPITAL_COMMUNITY): Payer: Self-pay | Admitting: Obstetrics and Gynecology

## 2011-03-22 DIAGNOSIS — Z1231 Encounter for screening mammogram for malignant neoplasm of breast: Secondary | ICD-10-CM

## 2011-03-26 NOTE — Op Note (Signed)
Cindy Higgins, Cindy Higgins                 ACCOUNT NO.:  [id number]   MEDICAL RECORD NO.:  [id number]          PATIENT TYPE:  INP   LOCATION:  9399                          FACILITY:  [facility]   PHYSICIAN:  Carrington Clamp, M.D. DATE OF BIRTH:  February 10, 1946   DATE OF PROCEDURE:  04/20/2006  DATE OF DISCHARGE:                                 OPERATIVE REPORT   PREOPERATIVE DIAGNOSIS:  Postmenopausal bleeding, submucosal fibroid, and  left lower quadrant pain.   POSTOPERATIVE DIAGNOSIS:  Postmenopausal bleeding, submucosal fibroid, and  left lower quadrant pain.   PROCEDURE:  Laparoscopic-assisted vaginal hysterectomy with bilateral  salpingo-oophorectomy and lysis of adhesions.   SURGEON:  Carrington Clamp, M.D.   ASSISTANT:  Dr. Lodema Hong.   ANESTHESIA:  General.   SPECIMENS:  Uterus, cervix, and bilateral tubes and ovaries.   ESTIMATED BLOOD LOSS:  200 cc.   URINE OUTPUT:  Not measured.   IV FLUIDS:  2100 cc.   COMPLICATIONS:  Difficult intubation.  The patient was adequately ventilated  and had an adequate airway during the entire procedure; however, it was  difficult to get the endotracheal tube through the epiglottis.  Under the  excellent care of Dr. Arby Barrette; however, the patient was intubated through  LMA with endoscopic assistance.  Dr. Arby Barrette discussed with the family this  complication, as I did, and I warned the patient's family that she may have  a sore throat after this.  There were no other complications nor were there  any long-term complications from the difficult intubation.   FINDINGS:  About an 8 week size uterus with normal ovaries bilaterally.  There appeared to be a small left inguinal hernia, but this was very small.  There were adhesions of the sigmoid colon and the cecum to the anterior  abdominal wall.   MEDICATIONS:  Enfamil milk and lidocaine with epi.   TECHNIQUE:  After adequate general anesthesia was achieved, the patient was  prepped and draped  in the usual sterile fashion, dorsal lithotomy position.  The bladder was emptied with a red rubber catheter and a speculum placed  into the vagina, and a uterine manipulator passed through the cervix.  Attention was then turned to the abdomen where a 2 cm infraumbilical  incision was made with the scalpel and then a Veress needle passed into the  abdomen without aspiration of bowel contents or blood.  The abdomen was  insufflated, and the 10 mm trocar placed without complication.  There were  two additional 5 mm trocars placed on either side of the pelvis under direct  visualization of the scope.   The above findings were noted, and adhesions were taken down with sharp  scissors dissection on the left-hand side.  A small amount of bleeding in  the epiploica fat was taken care of with a brief pulse of the gyrus  instrument, otherwise the bowel was clear of the entire dissection.  Dissection then began on the left IP ligament with the gyrus instrument,  then continued, necessitating success bites of cautery and cut to pass the  round ligament.  The same  procedure was performed on the right-hand side.  Hemostasis was achieved with cautery.  The abdomen was desufflated, and  attention was then turned to the vagina, where 40 cc of Enfamil was  instilled into the bladder, and the red rubber catheter removed.  A weighted  speculum and the Deaver placed in the vagina, and the cervix was grasped  with a Leahey clamp.  The cervix was injected with a small amount of 1%  lidocaine with epinephrine, which the patient had reported previously would  raise her heart rate.  A circumferential incision was made with the scalpel,  and the posterior cul de sac entered into with the Mayo scissors.  The long  ductal was then placed, and the vesicouterine fascia was then dissected off  of the cervix.  The uterosacral ligaments were grasped bilaterally with a  pair of Heaney clamps.  Each pedicle was incised with  the Mayo scissors and  secured with a Heaney stitch of 0 Vicryl.  Dissection of the vesicouterine  fascia off the cervix and then eventually entry into the anterior peritoneum  was effected.  The bladder was then retracted away with the Deaver clamp and  alternating successive bites of the Heaney clamp were used to grasp the  broad ligament.  Each pedicle was then incised with the Mayo scissors and  secured with a stitch of 0 Vicryl.  At the level of the last pedicle, each  pedicle was secured with a free hand tie of 0 Vicryl, and the uterus was  then essentially amputated.   All of the pedicles were found to be hemostatic, and the peritoneum was then  closed with a running purse-string stitch that included the anterior  peritoneum, the uterosacral, the posterior cul de sac peritoneum in a Halban-  like closure.  This is then tied down in a purse-string manner to close the  peritoneum.  The vaginal cuff was then closed with figure-of-eight stitches  of 0 Vicryl.  All instruments were then withdrawn from the vagina, and Foley  catheter was placed in the bladder.  Gloves were changed.  Attention was  turned to the abdomen, where the abdomen was reinsufflated, and the scope  placed back in the abdomen.  A small amount of cautery on the vaginal cuff  edge and the right ovarian infundibulopelvic ligament was used to insure  hemostasis.  On the right-hand side, the adhesions were taken down with  blunt and sharp dissection with the gyrus cautery, which was well away from  the bowel.  Although all adhesions were unable to be removed, the majority  of them that were in the areas of the pain that the patient had been  complaining of were removed.   All instruments were then withdrawn from the abdomen, and the abdomen  desufflated.  The 10 mm trocar fascial incision was closed with figure-of-  eight stitch of 2-0 Vicryl.  The suprapubic incisions were closed with a through-and-through stitch of  3-0 Vicryl repeat.  The incisions were closed  with staples.  The patient tolerated the procedure well and was returned to  recovery in stable condition.      Carrington Clamp, M.D.  Electronically Signed     MH/MEDQ  D:  04/20/2006  T:  04/20/2006  Job:  [id number]

## 2011-03-26 NOTE — H&P (Signed)
NAMEDENINA, RIEGER NO.:  [id number]   MEDICAL RECORD NO.:  [id number]          PATIENT TYPE:  AMB   LOCATION:  SDC                           FACILITY:  [facility]   PHYSICIAN:  Carrington Clamp, M.D. DATE OF BIRTH:  06-06-1946   DATE OF ADMISSION:  04/20/2006  DATE OF DISCHARGE:                                HISTORY & PHYSICAL   CHIEF COMPLAINT:  This is a 65 year old G4, P2 who has had persistent  postmenopausal bleeding over the last year and continued left lower quadrant  pain.   HISTORY OF PRESENT ILLNESS:  Ms. Salminen presented to me about a year ago  complaining of some irregular bleeding almost like a period.  She had  underwent a D&C hysteroscopy in January of 2007 which revealed an  intrauterine fibroid which was removed and another possible submucosal  fibroid.  Patient had had continued bleeding since then despite treatment  with Prempro.  The patient has also had persistent left lower quadrant pain  which is still not better, worse with sitting.  She had underwent  colonoscopy which revealed no abnormalities.  The pain increases with  activity and she feels like she is walking differently.  She also complains  of tender pelvic side wall.  She had a CT scan which was negative for any  abnormalities.  After discussing with the patient all the risks, benefits,  and alternatives patient desires to proceed with definitive therapy.  She  will undergo a laparoscopic assisted vaginal hysterectomy so that we may  identify if there are any etiology for her left lower quadrant such as  hernia or adhesions.  She will then undergo a vaginal hysterectomy with BSO.   PAST MEDICAL HISTORY:  Patient has a history in the past of colon  obstruction, but has had no problems with her colon since then or a history  of colitis.  She does have mitral valve prolapse and needs preoperative  antibiotics.   PAST SURGICAL HISTORY:  D&C,  hysteroscopy.   PAST OB HISTORY:  Term  spontaneous vaginal delivery x2.   PAST GYN HISTORY:  Negative for sexually transmitted diseases or pelvic  infections.   REVIEW OF SYSTEMS:  Patient complains of no stress urinary incontinence and  has not had relief of her pain with Motrin.  Ultrasound revealed 7.8 x 4 x 5  cm uterus with normal-appearing ovaries and positive fibroids in the uterus.   ALLERGIES:  Patient does not tolerate CODEINE.   MEDICATIONS:  1.  Prempro 0.625 and 5 mg.  2.  Celexa.  3.  Xanax p.r.n.   PHYSICAL EXAMINATION:  VITAL SIGNS:  Blood pressure 110/74.  HEENT:  Anicteric.  HEART:  Regular rate and rhythm.  ABDOMEN:  Soft, tender left lower quadrant diffuse, but no rebound or  guarding.  No masses felt.  EXTREMITIES:  Benign.  SKIN:  Benign.  PELVIC:  Normal external genitalia.  Normal vaginal vault and cervix.  Uterus was about 8 weeks size and anteverted.  Adnexa and bladder were  otherwise negative.  RECTAL:  Rectal guaiac was negative.  ASSESSMENT:  This 65 year old with persistent postmenopausal bleeding  suspected to be from a submucosal fibroid and patient desiring definitive  therapy.  Patient also has left lower quadrant pain which deserves  investigation.  Patient will undergo a laparoscopic assisted vaginal  hysterectomy which will allow me to look for an etiology of the left lower  quadrant either hernia, adhesions, or endometriosis.  The patient wishes to  have her ovaries removed.  All risks, benefits, and alternatives have  discussed with the patient.  The patient understands and desires strongly to  proceed with definitive therapy.      Carrington Clamp, M.D.  Electronically Signed     MH/MEDQ  D:  04/20/2006  T:  04/20/2006  Job:  [id number]

## 2011-04-01 ENCOUNTER — Other Ambulatory Visit: Payer: Self-pay | Admitting: Internal Medicine

## 2011-04-06 ENCOUNTER — Emergency Department (HOSPITAL_COMMUNITY)
Admission: EM | Admit: 2011-04-06 | Discharge: 2011-04-06 | Disposition: A | Discharge status: Home / Self Care | Payer: BC Managed Care – PPO | Attending: Emergency Medicine | Admitting: Emergency Medicine

## 2011-04-06 ENCOUNTER — Emergency Department (HOSPITAL_COMMUNITY): Payer: BC Managed Care – PPO

## 2011-04-06 ENCOUNTER — Ambulatory Visit (HOSPITAL_COMMUNITY): Payer: BC Managed Care – PPO

## 2011-04-06 DIAGNOSIS — S7000XA Contusion of unspecified hip, initial encounter: Secondary | ICD-10-CM | POA: Insufficient documentation

## 2011-04-06 DIAGNOSIS — S79919A Unspecified injury of unspecified hip, initial encounter: Secondary | ICD-10-CM | POA: Insufficient documentation

## 2011-04-06 DIAGNOSIS — M25519 Pain in unspecified shoulder: Secondary | ICD-10-CM | POA: Insufficient documentation

## 2011-04-06 DIAGNOSIS — M25559 Pain in unspecified hip: Secondary | ICD-10-CM | POA: Insufficient documentation

## 2011-04-06 DIAGNOSIS — Z79899 Other long term (current) drug therapy: Secondary | ICD-10-CM | POA: Insufficient documentation

## 2011-04-06 DIAGNOSIS — W010XXA Fall on same level from slipping, tripping and stumbling without subsequent striking against object, initial encounter: Secondary | ICD-10-CM | POA: Insufficient documentation

## 2011-04-06 DIAGNOSIS — R209 Unspecified disturbances of skin sensation: Secondary | ICD-10-CM | POA: Insufficient documentation

## 2011-04-15 ENCOUNTER — Other Ambulatory Visit: Payer: Self-pay

## 2011-04-15 MED ORDER — ALPRAZOLAM 0.5 MG PO TABS: 0.5000 mg | ORAL_TABLET | Freq: Two times a day (BID) | ORAL | Status: DC

## 2011-04-15 NOTE — Telephone Encounter (Signed)
Rx faxed to pharmacy  

## 2011-04-20 ENCOUNTER — Ambulatory Visit (HOSPITAL_COMMUNITY)
Admission: RE | Admit: 2011-04-20 | Discharge: 2011-04-20 | Disposition: A | Discharge status: Home / Self Care | Payer: BC Managed Care – PPO | Source: Ambulatory Visit | Attending: Obstetrics and Gynecology | Admitting: Obstetrics and Gynecology

## 2011-04-20 DIAGNOSIS — Z1231 Encounter for screening mammogram for malignant neoplasm of breast: Secondary | ICD-10-CM | POA: Insufficient documentation

## 2011-04-30 ENCOUNTER — Encounter: Payer: Self-pay | Admitting: Internal Medicine

## 2011-04-30 ENCOUNTER — Ambulatory Visit (INDEPENDENT_AMBULATORY_CARE_PROVIDER_SITE_OTHER): Payer: BC Managed Care – PPO | Admitting: Internal Medicine

## 2011-04-30 VITALS — BP 120/72 | HR 76 | Temp 98.0°F | Ht 65.0 in

## 2011-04-30 DIAGNOSIS — J029 Acute pharyngitis, unspecified: Secondary | ICD-10-CM

## 2011-04-30 MED ORDER — AMOXICILLIN 500 MG PO CAPS: 500.0000 mg | ORAL_CAPSULE | Freq: Three times a day (TID) | ORAL | Status: AC

## 2011-04-30 NOTE — Progress Notes (Signed)
  Subjective:     Cindy Higgins is a 65 y.o. female who presents for evaluation of sore throat. Associated symptoms include dry cough, hoarseness, nasal blockage, post nasal drip, sinus and nasal congestion and sore throat. Onset of symptoms was 8 days ago, and have been unchanged since that time. She is drinking plenty of fluids. She has had a recent close exposure to someone with proven streptococcal pharyngitis.  The following portions of the patient's history were reviewed and updated as appropriate: allergies, current medications, past family history, past medical history, past social history, past surgical history and problem list.  Review of Systems A comprehensive review of systems was negative except for: Constitutional: positive for fevers Cardiovascular: positive for fatigue    Objective:    BP 120/72  Pulse 76  Temp(Src) 98 F (36.7 C) (Oral)  Ht 5\' 5"  (1.651 m)  SpO2 96% General appearance: alert, cooperative and mild distress Head: Normocephalic, without obvious abnormality, atraumatic, sinuses nontender to percussion Eyes: conjunctivae/corneas clear. PERRL, EOM's intact. Fundi benign. Ears: normal TM's and external ear canals both ears Nose: Nares normal. Septum midline. Mucosa normal. No drainage or sinus tenderness. Throat: abnormal findings: exudates present and moderate oropharyngeal erythema Lungs: clear to auscultation bilaterally Heart: regular rate and rhythm, S1, S2 normal, no murmur, click, rub or gallop  Laboratory Strep test not done. Results:NA.    Assessment:    Acute pharyngitis, likely  bacterial, tx for possible strep.    Plan:    Patient placed on antibiotics. Use of OTC analgesics recommended as well as salt water gargles. Use of decongestant recommended. Patient advised that he will be infectious for 24 hours after starting antibiotics.

## 2011-04-30 NOTE — Patient Instructions (Signed)
It was good to see you today. Marland Kitchenamoxicllin antibiotics - Your prescription(s) have been submitted to your pharmacy. Please take as directed and contact our office if you believe you are having problem(s) with the medication(s). Keep hydrated, salt water gargles and robitussin or coricidain as needed

## 2011-05-06 ENCOUNTER — Other Ambulatory Visit (INDEPENDENT_AMBULATORY_CARE_PROVIDER_SITE_OTHER): Payer: BC Managed Care – PPO

## 2011-05-06 ENCOUNTER — Other Ambulatory Visit: Payer: BC Managed Care – PPO

## 2011-05-06 DIAGNOSIS — Z0000 Encounter for general adult medical examination without abnormal findings: Secondary | ICD-10-CM

## 2011-05-06 LAB — CBC WITH DIFFERENTIAL/PLATELET
Basophils Absolute: 0 10*3/uL (ref 0.0–0.1)
Eosinophils Absolute: 0.1 10*3/uL (ref 0.0–0.7)
HCT: 37.5 % (ref 36.0–46.0)
Lymphocytes Relative: 38.6 % (ref 12.0–46.0)
Monocytes Absolute: 0.4 10*3/uL (ref 0.1–1.0)
Monocytes Relative: 9.1 % (ref 3.0–12.0)
Neutro Abs: 2.2 10*3/uL (ref 1.4–7.7)
Platelets: 323 10*3/uL (ref 150.0–400.0)
RDW: 13.1 % (ref 11.5–14.6)

## 2011-05-06 LAB — URINALYSIS
Leukocytes, UA: NEGATIVE
Nitrite: NEGATIVE
Urine Glucose: NEGATIVE
Urobilinogen, UA: 0.2 (ref 0.0–1.0)
pH: 5.5 (ref 5.0–8.0)

## 2011-05-06 LAB — BASIC METABOLIC PANEL
BUN: 14 mg/dL (ref 6–23)
Creatinine, Ser: 0.4 mg/dL (ref 0.4–1.2)
Potassium: 4.8 mEq/L (ref 3.5–5.1)
Sodium: 136 mEq/L (ref 135–145)

## 2011-05-06 LAB — HEPATIC FUNCTION PANEL
ALT: 16 U/L (ref 0–35)
AST: 21 U/L (ref 0–37)
Albumin: 4 g/dL (ref 3.5–5.2)
Alkaline Phosphatase: 49 U/L (ref 39–117)
Total Protein: 7.1 g/dL (ref 6.0–8.3)

## 2011-05-06 LAB — LIPID PANEL
Cholesterol: 219 mg/dL — ABNORMAL HIGH (ref 0–200)
Total CHOL/HDL Ratio: 3
Triglycerides: 68 mg/dL (ref 0.0–149.0)

## 2011-05-06 LAB — LDL CHOLESTEROL, DIRECT: Direct LDL: 106.1 mg/dL

## 2011-05-09 ENCOUNTER — Encounter: Payer: Self-pay | Admitting: Internal Medicine

## 2011-05-09 DIAGNOSIS — Z0001 Encounter for general adult medical examination with abnormal findings: Secondary | ICD-10-CM | POA: Insufficient documentation

## 2011-05-09 DIAGNOSIS — R7302 Impaired glucose tolerance (oral): Secondary | ICD-10-CM | POA: Insufficient documentation

## 2011-05-09 DIAGNOSIS — Z1211 Encounter for screening for malignant neoplasm of colon: Secondary | ICD-10-CM | POA: Insufficient documentation

## 2011-05-09 DIAGNOSIS — Z0000 Encounter for general adult medical examination without abnormal findings: Secondary | ICD-10-CM | POA: Insufficient documentation

## 2011-05-09 HISTORY — DX: Impaired glucose tolerance (oral): R73.02

## 2011-05-10 ENCOUNTER — Ambulatory Visit (INDEPENDENT_AMBULATORY_CARE_PROVIDER_SITE_OTHER): Payer: BC Managed Care – PPO | Admitting: Internal Medicine

## 2011-05-10 ENCOUNTER — Encounter: Payer: Self-pay | Admitting: Internal Medicine

## 2011-05-10 VITALS — BP 138/78 | HR 84 | Temp 97.8°F | Resp 14 | Ht 65.0 in | Wt 128.0 lb

## 2011-05-10 DIAGNOSIS — B0230 Zoster ocular disease, unspecified: Secondary | ICD-10-CM

## 2011-05-10 DIAGNOSIS — Z2911 Encounter for prophylactic immunotherapy for respiratory syncytial virus (RSV): Secondary | ICD-10-CM

## 2011-05-10 DIAGNOSIS — Z136 Encounter for screening for cardiovascular disorders: Secondary | ICD-10-CM

## 2011-05-10 DIAGNOSIS — R7309 Other abnormal glucose: Secondary | ICD-10-CM

## 2011-05-10 DIAGNOSIS — R7302 Impaired glucose tolerance (oral): Secondary | ICD-10-CM

## 2011-05-10 DIAGNOSIS — Z0000 Encounter for general adult medical examination without abnormal findings: Secondary | ICD-10-CM

## 2011-05-10 HISTORY — DX: Zoster ocular disease, unspecified: B02.30

## 2011-05-10 NOTE — Progress Notes (Signed)
Subjective:    Patient ID: Cindy Higgins, female    DOB: Apr 05, 1946, 65 y.o.   MRN: [id number]  HPI Here for wellness and f/u;  Overall doing ok;  Pt denies CP, worsening SOB, DOE, wheezing, orthopnea, PND, worsening LE edema, palpitations, dizziness or syncope.  Pt denies neurological change such as new Headache, facial or extremity weakness.  Pt denies polydipsia, polyuria, or low sugar symptoms. Pt states overall good compliance with treatment and medications, good tolerability, and trying to follow lower cholesterol diet.  Pt denies worsening depressive symptoms, suicidal ideation or panic. No fever, wt loss, night sweats, loss of appetite, or other constitutional symptoms.  Pt states good ability with ADL's, low fall risk, home safety reviewed and adequate, no significant changes in hearing or vision, and occasionally active with exercise. Considering retirement in approx 16 mo. Work very stressful. No other new complaints Past Medical History  Diagnosis Date  . ALLERGIC RHINITIS 12/22/2007  . ANXIETY DEPRESSION 08/30/2007  . BLURRED VISION, INTERMITTENT 12/22/2007  . COLONIC POLYPS, HX OF 12/22/2007  . GLUCOSE INTOLERANCE 07/08/2009  . Headache 12/22/2007  . HYPERCHOLESTEROLEMIA 08/30/2007  . HYPERLIPIDEMIA 03/26/2008  . MITRAL VALVE PROLAPSE, HX OF 08/30/2007  . OSTEOPOROSIS 12/22/2007  . POSTMENOPAUSAL STATUS 08/30/2007  . SINUSITIS- ACUTE-NOS 02/06/2010  . Impaired glucose tolerance 05/09/2011  . Herpes zoster of eye 05/10/2011   Past Surgical History  Procedure Date  . Abdominal hysterectomy   . Dilation and curettage of uterus 2007  . Oophorectomy     reports that she has never smoked. She does not have any smokeless tobacco history on file. She reports that she does not drink alcohol or use illicit drugs. family history includes Alcohol abuse in her father; Breast cancer in her maternal grandmother and sister; Diabetes in her mother; Heart disease in her mother; Hyperlipidemia in her  mother; and Testicular cancer in her son. Allergies  Allergen Reactions  . Penicillins Rash    Amoxicillin reaction   Current Outpatient Prescriptions on File Prior to Visit  Medication Sig Dispense Refill  . ALPRAZolam (XANAX) 0.5 MG tablet Take 1 tablet (0.5 mg total) by mouth 2 (two) times daily.  60 tablet  5  . citalopram (CELEXA) 40 MG tablet TAKE ONE TABLET BY MOUTH ONE TIME DAILY  90 tablet  3  . estrogens, conjugated, (PREMARIN) 0.3 MG tablet Take 0.3 mg by mouth daily. Take daily for 21 days then do not take for 7 days.       Marland Kitchen amoxicillin (AMOXIL) 500 MG capsule Take 1 capsule (500 mg total) by mouth 3 (three) times daily.  21 capsule  0   Review of Systems Review of Systems  Constitutional: Negative for diaphoresis, activity change, appetite change and unexpected weight change.  HENT: Negative for hearing loss, ear pain, facial swelling, mouth sores and neck stiffness.   Eyes: Negative for pain, redness and visual disturbance.  Respiratory: Negative for shortness of breath and wheezing.   Cardiovascular: Negative for chest pain and palpitations.  Gastrointestinal: Negative for diarrhea, blood in stool, abdominal distention and rectal pain.  Genitourinary: Negative for hematuria, flank pain and decreased urine volume.  Musculoskeletal: Negative for myalgias and joint swelling.  Skin: Negative for color change and wound.  Neurological: Negative for syncope and numbness.  Hematological: Negative for adenopathy.  Psychiatric/Behavioral: Negative for hallucinations, self-injury, decreased concentration and agitation.      Objective:   Physical Exam BP 138/78  Pulse 84  Temp(Src) 97.8 F (36.6 C) (Oral)  Resp 14  Ht 5\' 5"  (1.651 m)  Wt 128 lb (58.06 kg)  BMI 21.30 kg/m2  SpO2 96% Physical Exam  VS noted Constitutional: Pt is oriented to person, place, and time. Appears well-developed and well-nourished.  HENT:  Head: Normocephalic and atraumatic.  Right Ear: External  ear normal.  Left Ear: External ear normal.  Nose: Nose normal.  Mouth/Throat: Oropharynx is clear and moist.  Eyes: Conjunctivae and EOM are normal. Pupils are equal, round, and reactive to light.  Neck: Normal range of motion. Neck supple. No JVD present. No tracheal deviation present.  Cardiovascular: Normal rate, regular rhythm, normal heart sounds and intact distal pulses.   Pulmonary/Chest: Effort normal and breath sounds normal.  Abdominal: Soft. Bowel sounds are normal. There is no tenderness.  Musculoskeletal: Normal range of motion. Exhibits no edema.  Lymphadenopathy:  Has no cervical adenopathy.  Neurological: Pt is alert and oriented to person, place, and time. Pt has normal reflexes. No cranial nerve deficit.  Skin: Skin is warm and dry. No rash noted.  Psychiatric:  Has  normal mood and affect. Behavior is normal. 1+ nervous       Assessment & Plan:

## 2011-05-10 NOTE — Patient Instructions (Signed)
You had the zostavax today You are otherwise up to date Please have the pharmacy call if you need any refills Please return in 1 year for your yearly visit, or sooner if needed, with Lab testing done 3-5 days before

## 2011-05-10 NOTE — Assessment & Plan Note (Signed)

## 2011-08-12 ENCOUNTER — Ambulatory Visit (INDEPENDENT_AMBULATORY_CARE_PROVIDER_SITE_OTHER): Payer: BC Managed Care – PPO | Admitting: Internal Medicine

## 2011-08-12 ENCOUNTER — Encounter: Payer: Self-pay | Admitting: Internal Medicine

## 2011-08-12 VITALS — BP 112/70 | HR 89 | Temp 98.1°F | Ht 65.0 in | Wt 128.0 lb

## 2011-08-12 DIAGNOSIS — H1032 Unspecified acute conjunctivitis, left eye: Secondary | ICD-10-CM

## 2011-08-12 DIAGNOSIS — R7309 Other abnormal glucose: Secondary | ICD-10-CM

## 2011-08-12 DIAGNOSIS — F341 Dysthymic disorder: Secondary | ICD-10-CM

## 2011-08-12 DIAGNOSIS — H1030 Unspecified acute conjunctivitis, unspecified eye: Secondary | ICD-10-CM

## 2011-08-12 DIAGNOSIS — R7302 Impaired glucose tolerance (oral): Secondary | ICD-10-CM

## 2011-08-12 DIAGNOSIS — J019 Acute sinusitis, unspecified: Secondary | ICD-10-CM

## 2011-08-12 NOTE — Patient Instructions (Signed)
Please finish the antibiotics as you have Continue all other medications as before You are given the work note

## 2011-08-12 NOTE — Assessment & Plan Note (Signed)
stable overall by hx and exam, most recent data reviewed with pt, and pt to continue medical treatment as before  Lab Results  Component Value Date   HGBA1C 5.5 11/17/2009

## 2011-08-12 NOTE — Assessment & Plan Note (Signed)
Overall improved, pt to finish current tx, educated and reassured,  to f/u any worsening symptoms or concerns

## 2011-08-12 NOTE — Assessment & Plan Note (Signed)
stable overall by hx and exam, most recent data reviewed with pt, and pt to continue medical treatment as before  Lab Results  Component Value Date   WBC 4.4* 05/06/2011   HGB 12.8 05/06/2011   HCT 37.5 05/06/2011   PLT 323.0 05/06/2011   GLUCOSE 103* 05/06/2011   CHOL 219* 05/06/2011   TRIG 68.0 05/06/2011   HDL 87.40 05/06/2011   LDLDIRECT 106.1 05/06/2011   ALT 16 05/06/2011   AST 21 05/06/2011   NA 136 05/06/2011   K 4.8 05/06/2011   CL 101 05/06/2011   CREATININE 0.4 05/06/2011   BUN 14 05/06/2011   CO2 26 05/06/2011   TSH 3.00 05/06/2011   HGBA1C 5.5 11/17/2009

## 2011-08-12 NOTE — Progress Notes (Signed)
Subjective:    Patient ID: Cindy Higgins, female    DOB: 1946/09/14, 65 y.o.   MRN: [id number]  HPI  Here to f/u, after being seen at urgent care, per pt dx with acute conjunctivitis left eye, sinusitis, and bronchitis.  Tx with clarithromycin bid x  7 days and amoxil gtts .  In f/u she now states overall matting and drainage from the left eye is at least 50 % improved, left sinus pressure and pain improved, and cough less, nonprod, no blood and Pt denies chest pain, increased sob or doe, wheezing, orthopnea, PND, increased LE swelling, palpitations, dizziness or syncope.  Pt denies new neurological symptoms such as new headache, or facial or extremity weakness or numbness   Pt denies polydipsia, polyuria. 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 Past Medical History  Diagnosis Date  . ALLERGIC RHINITIS 12/22/2007  . ANXIETY DEPRESSION 08/30/2007  . BLURRED VISION, INTERMITTENT 12/22/2007  . COLONIC POLYPS, HX OF 12/22/2007  . GLUCOSE INTOLERANCE 07/08/2009  . Headache 12/22/2007  . HYPERCHOLESTEROLEMIA 08/30/2007  . HYPERLIPIDEMIA 03/26/2008  . MITRAL VALVE PROLAPSE, HX OF 08/30/2007  . OSTEOPOROSIS 12/22/2007  . POSTMENOPAUSAL STATUS 08/30/2007  . SINUSITIS- ACUTE-NOS 02/06/2010  . Impaired glucose tolerance 05/09/2011  . Herpes zoster of eye 05/10/2011   Past Surgical History  Procedure Date  . Abdominal hysterectomy   . Dilation and curettage of uterus 2007  . Oophorectomy     reports that she has never smoked. She does not have any smokeless tobacco history on file. She reports that she does not drink alcohol or use illicit drugs. family history includes Alcohol abuse in her father; Breast cancer in her maternal grandmother and sister; Diabetes in her mother; Heart disease in her mother; Hyperlipidemia in her  mother; and Testicular cancer in her son. Allergies  Allergen Reactions  . Penicillins Rash    Amoxicillin reaction   Current Outpatient Prescriptions on File Prior to Visit  Medication Sig Dispense Refill  . ALPRAZolam (XANAX) 0.5 MG tablet Take 1 tablet (0.5 mg total) by mouth 2 (two) times daily.  60 tablet  5  . citalopram (CELEXA) 40 MG tablet TAKE ONE TABLET BY MOUTH ONE TIME DAILY  90 tablet  3  . estrogens, conjugated, (PREMARIN) 0.3 MG tablet Take 0.3 mg by mouth daily. Take daily for 21 days then do not take for 7 days.        Review of Systems Review of Systems  Constitutional: Negative for diaphoresis and unexpected weight change.  HENT: Negative for drooling and tinnitus.   Eyes: Negative for photophobia and visual disturbance.  Respiratory: Negative for choking and stridor.   Gastrointestinal: Negative for vomiting and blood in stool.  Genitourinary: Negative for hematuria and decreased urine volume.     Objective:   Physical Exam BP 112/70  Pulse 89  Temp(Src) 98.1 F (36.7 C) (Oral)  Ht 5\' 5"  (1.651 m)  Wt 128 lb (58.06 kg)  BMI 21.30 kg/m2  SpO2 98% Physical Exam  VS noted, mild ill Constitutional: Pt appears well-developed and well-nourished.  HENT: Head: Normocephalic.  Right Ear: External ear normal.  Left Ear: External ear normal.  Eyes: Conjunctivae and EOM are normal except for left conjunctiva with mild erythema, non drainage and upper eyelid 1+ swollen. Pupils are equal, round, and reactive to light.  Bilat  tm's mild erythema.  Sinus tender with mild swelling left maxillary only.  Pharynx mild erythema Neck: Normal range of motion. Neck supple.  Cardiovascular: Normal rate and regular rhythm.   Pulmonary/Chest: Effort normal and breath sounds normal.  Abd:  Soft, NT, non-distended, + BS Neurological: Pt is alert. No cranial nerve deficit.  Skin: Skin is warm. No erythema.  Psychiatric: Pt behavior is normal. Thought content normal. 1-2+ nervous      Assessment & Plan:

## 2011-08-12 NOTE — Assessment & Plan Note (Signed)
Overall improved, pt to finish current tx, educated and reassured,  to f/u any worsening symptoms or concerns  

## 2011-10-18 ENCOUNTER — Other Ambulatory Visit: Payer: Self-pay

## 2011-10-18 MED ORDER — ALPRAZOLAM 0.5 MG PO TABS: 0.5000 mg | ORAL_TABLET | Freq: Two times a day (BID) | ORAL | Status: DC

## 2011-10-18 NOTE — Telephone Encounter (Signed)
Done hardcopy to robin  

## 2011-10-18 NOTE — Telephone Encounter (Signed)
Faxed hardcopy to pharmacy. 

## 2012-01-26 ENCOUNTER — Other Ambulatory Visit: Payer: Self-pay

## 2012-01-26 ENCOUNTER — Ambulatory Visit (INDEPENDENT_AMBULATORY_CARE_PROVIDER_SITE_OTHER): Payer: BC Managed Care – PPO | Admitting: Internal Medicine

## 2012-01-26 ENCOUNTER — Encounter: Payer: Self-pay | Admitting: Internal Medicine

## 2012-01-26 VITALS — BP 110/70 | HR 91 | Temp 98.5°F | Ht 65.0 in | Wt 131.2 lb

## 2012-01-26 DIAGNOSIS — J309 Allergic rhinitis, unspecified: Secondary | ICD-10-CM

## 2012-01-26 DIAGNOSIS — J069 Acute upper respiratory infection, unspecified: Secondary | ICD-10-CM

## 2012-01-26 DIAGNOSIS — E559 Vitamin D deficiency, unspecified: Secondary | ICD-10-CM | POA: Insufficient documentation

## 2012-01-26 DIAGNOSIS — F341 Dysthymic disorder: Secondary | ICD-10-CM

## 2012-01-26 MED ORDER — FEXOFENADINE HCL 180 MG PO TABS: 180.0000 mg | ORAL_TABLET | Freq: Every day | ORAL | Status: DC

## 2012-01-26 MED ORDER — FLUTICASONE PROPIONATE 50 MCG/ACT NA SUSP: 2.0000 | Freq: Every day | NASAL | Status: DC

## 2012-01-26 MED ORDER — AZITHROMYCIN 250 MG PO TABS: ORAL_TABLET | ORAL | Status: AC

## 2012-01-26 NOTE — Patient Instructions (Signed)
Take all new medications as prescribed Continue all other medications as before Please go to LAB in the Basement for the blood and/or urine tests to be done today  - the vit D You will be contacted by phone if any changes need to be made immediately.  Otherwise, you will receive a letter about your results with an explanation. You can also take Delsym OTC for cough, and/or Mucinex (or it's generic off brand) for congestion

## 2012-01-27 ENCOUNTER — Encounter: Payer: Self-pay | Admitting: Internal Medicine

## 2012-01-30 ENCOUNTER — Encounter: Payer: Self-pay | Admitting: Internal Medicine

## 2012-01-30 NOTE — Progress Notes (Signed)
Subjective:    Patient ID: Cindy Higgins, female    DOB: Feb 26, 1946, 66 y.o.   MRN: [id number]  HPI   Here with 3 wks acute onset fever, facial pain, pressure, general weakness and malaise, and greenish d/c, with slight ST, but little to no cough and Pt denies chest pain, increased sob or doe, wheezing, orthopnea, PND, increased LE swelling, palpitations, dizziness or syncope.  Pt denies new neurological symptoms such as new headache, or facial or extremity weakness or numbness  Has ongoing marked work stress.  Does have several wks ongoing nasal allergy symptoms with clear congestion, itch and sneeze, without fever, pain, ST, cough or wheezing.  Has hx of vit d deficiency, asks for repeat level today, has been tx with high dose vit d in the past Past Medical History  Diagnosis Date  . ALLERGIC RHINITIS 12/22/2007  . ANXIETY DEPRESSION 08/30/2007  . BLURRED VISION, INTERMITTENT 12/22/2007  . COLONIC POLYPS, HX OF 12/22/2007  . GLUCOSE INTOLERANCE 07/08/2009  . Headache 12/22/2007  . HYPERCHOLESTEROLEMIA 08/30/2007  . HYPERLIPIDEMIA 03/26/2008  . MITRAL VALVE PROLAPSE, HX OF 08/30/2007  . OSTEOPOROSIS 12/22/2007  . POSTMENOPAUSAL STATUS 08/30/2007  . SINUSITIS- ACUTE-NOS 02/06/2010  . Impaired glucose tolerance 05/09/2011  . Herpes zoster of eye 05/10/2011   Past Surgical History  Procedure Date  . Abdominal hysterectomy   . Dilation and curettage of uterus 2007  . Oophorectomy     reports that she has never smoked. She does not have any smokeless tobacco history on file. She reports that she does not drink alcohol or use illicit drugs. family history includes Alcohol abuse in her father; Breast cancer in her maternal grandmother and sister; Diabetes in her mother; Heart disease in her mother; Hyperlipidemia in her mother; and Testicular cancer in her son. Allergies  Allergen Reactions  . Penicillins Rash    Amoxicillin reaction   Current Outpatient Prescriptions on File Prior to Visit    Medication Sig Dispense Refill  . ALPRAZolam (XANAX) 0.5 MG tablet Take 1 tablet (0.5 mg total) by mouth 2 (two) times daily.  60 tablet  5  . citalopram (CELEXA) 40 MG tablet TAKE ONE TABLET BY MOUTH ONE TIME DAILY  90 tablet  3  . estrogens, conjugated, (PREMARIN) 0.3 MG tablet Take 0.3 mg by mouth daily. Take daily for 21 days then do not take for 7 days.        Review of Systems Review of Systems  Constitutional: Negative for diaphoresis and unexpected weight change.  HENT: Negative for drooling and tinnitus.   Eyes: Negative for photophobia and visual disturbance.  Respiratory: Negative for choking and stridor.   Gastrointestinal: Negative for vomiting and blood in stool.  Genitourinary: Negative for hematuria and decreased urine volume.  Musculoskeletal: Negative for gait problem.    Objective:   Physical Exam BP 110/70  Pulse 91  Temp(Src) 98.5 F (36.9 C) (Oral)  Ht 5\' 5"  (1.651 m)  Wt 131 lb 4 oz (59.535 kg)  BMI 21.84 kg/m2  SpO2 97% Physical Exam  VS noted, mild ill Constitutional: Pt appears well-developed and well-nourished.  HENT: Head: Normocephalic.  Right Ear: External ear normal.  Left Ear: External ear normal.  Bilat tm's mild erythema.  Sinus tender.  Pharynx mild erythema Eyes: Conjunctivae and EOM are normal. Pupils are equal, round, and reactive to light.  Neck: Normal range of motion. Neck supple.  Cardiovascular: Normal rate and regular rhythm.   Pulmonary/Chest: Effort normal and breath sounds normal.  Abd:  Soft, NT, non-distended, + BS Skin: Skin is warm. No erythema.  Psychiatric: Pt behavior is normal. Thought content normal. 1-2+ nervous    Assessment & Plan:

## 2012-01-30 NOTE — Assessment & Plan Note (Signed)
Mild to mod, for allegra/flonase asd,  to f/u any worsening symptoms or concerns  

## 2012-01-30 NOTE — Assessment & Plan Note (Signed)
For repeat vit d today,  to f/u any worsening symptoms or concerns, consider vit d 1000 units per day

## 2012-01-30 NOTE — Assessment & Plan Note (Signed)
Mild to mod, for antibx course,  to f/u any worsening symptoms or concerns 

## 2012-01-30 NOTE — Assessment & Plan Note (Signed)
Mild worsening recnetly due to ongoing work stress, delcines further change in med or counseling

## 2012-04-08 ENCOUNTER — Other Ambulatory Visit: Payer: Self-pay | Admitting: Internal Medicine

## 2012-04-14 ENCOUNTER — Other Ambulatory Visit (HOSPITAL_COMMUNITY): Payer: Self-pay | Admitting: Obstetrics and Gynecology

## 2012-04-14 DIAGNOSIS — Z1231 Encounter for screening mammogram for malignant neoplasm of breast: Secondary | ICD-10-CM

## 2012-05-05 ENCOUNTER — Other Ambulatory Visit (INDEPENDENT_AMBULATORY_CARE_PROVIDER_SITE_OTHER): Payer: BC Managed Care – PPO

## 2012-05-05 DIAGNOSIS — Z0000 Encounter for general adult medical examination without abnormal findings: Secondary | ICD-10-CM

## 2012-05-05 DIAGNOSIS — R7302 Impaired glucose tolerance (oral): Secondary | ICD-10-CM

## 2012-05-05 DIAGNOSIS — R7309 Other abnormal glucose: Secondary | ICD-10-CM

## 2012-05-05 LAB — CBC WITH DIFFERENTIAL/PLATELET
Eosinophils Relative: 2.2 % (ref 0.0–5.0)
HCT: 36.9 % (ref 36.0–46.0)
Hemoglobin: 12.4 g/dL (ref 12.0–15.0)
Lymphs Abs: 1.5 10*3/uL (ref 0.7–4.0)
Monocytes Relative: 10.6 % (ref 3.0–12.0)
Neutro Abs: 1.9 10*3/uL (ref 1.4–7.7)
Platelets: 300 10*3/uL (ref 150.0–400.0)
RBC: 3.61 Mil/uL — ABNORMAL LOW (ref 3.87–5.11)
WBC: 4 10*3/uL — ABNORMAL LOW (ref 4.5–10.5)

## 2012-05-05 LAB — HEPATIC FUNCTION PANEL
Albumin: 3.8 g/dL (ref 3.5–5.2)
Alkaline Phosphatase: 44 U/L (ref 39–117)
Total Protein: 6.9 g/dL (ref 6.0–8.3)

## 2012-05-05 LAB — URINALYSIS, ROUTINE W REFLEX MICROSCOPIC
Bilirubin Urine: NEGATIVE
Hgb urine dipstick: NEGATIVE
Ketones, ur: NEGATIVE
Urine Glucose: NEGATIVE
Urobilinogen, UA: 1 (ref 0.0–1.0)

## 2012-05-05 LAB — BASIC METABOLIC PANEL
Chloride: 105 mEq/L (ref 96–112)
Creatinine, Ser: 0.6 mg/dL (ref 0.4–1.2)
Potassium: 5.1 mEq/L (ref 3.5–5.1)
Sodium: 139 mEq/L (ref 135–145)

## 2012-05-05 LAB — LIPID PANEL
Cholesterol: 194 mg/dL (ref 0–200)
HDL: 84.7 mg/dL (ref >39.00)
LDL Cholesterol: 99 mg/dL (ref 0–99)
Triglycerides: 52 mg/dL (ref 0.0–149.0)

## 2012-05-05 LAB — TSH: TSH: 2.73 u[IU]/mL (ref 0.35–5.50)

## 2012-05-09 ENCOUNTER — Ambulatory Visit (HOSPITAL_COMMUNITY)
Admission: RE | Admit: 2012-05-09 | Discharge: 2012-05-09 | Disposition: A | Discharge status: Home / Self Care | Payer: BC Managed Care – PPO | Source: Ambulatory Visit | Attending: Obstetrics and Gynecology | Admitting: Obstetrics and Gynecology

## 2012-05-09 DIAGNOSIS — Z1231 Encounter for screening mammogram for malignant neoplasm of breast: Secondary | ICD-10-CM

## 2012-05-10 ENCOUNTER — Other Ambulatory Visit: Payer: Self-pay

## 2012-05-10 ENCOUNTER — Encounter: Payer: Self-pay | Admitting: Internal Medicine

## 2012-05-10 ENCOUNTER — Ambulatory Visit (INDEPENDENT_AMBULATORY_CARE_PROVIDER_SITE_OTHER): Payer: BC Managed Care – PPO | Admitting: Internal Medicine

## 2012-05-10 VITALS — BP 110/72 | HR 92 | Temp 97.7°F | Ht 64.0 in | Wt 126.5 lb

## 2012-05-10 DIAGNOSIS — Z0000 Encounter for general adult medical examination without abnormal findings: Secondary | ICD-10-CM

## 2012-05-10 DIAGNOSIS — M810 Age-related osteoporosis without current pathological fracture: Secondary | ICD-10-CM

## 2012-05-10 DIAGNOSIS — E559 Vitamin D deficiency, unspecified: Secondary | ICD-10-CM

## 2012-05-10 MED ORDER — ALPRAZOLAM 0.5 MG PO TABS: 0.5000 mg | ORAL_TABLET | Freq: Two times a day (BID) | ORAL | Status: DC | PRN

## 2012-05-10 MED ORDER — CITALOPRAM HYDROBROMIDE 40 MG PO TABS: 40.0000 mg | ORAL_TABLET | Freq: Every day | ORAL | Status: DC

## 2012-05-10 NOTE — Patient Instructions (Addendum)
Please remember to followup with your GYN for the yearly pap smear and/or mammogram with Dr Henderson Cloud Please go to LAB in the Basement for the blood and/or urine tests to be done today - the Vitamin D and PTH level Continue all other medications as before Please have the pharmacy call with any refills you may need. You are otherwise up to date with prevention Good Luck with your retirement in October. Please return in 1 year for your yearly visit, or sooner if needed, with Lab testing done 3-5 days before

## 2012-05-10 NOTE — Assessment & Plan Note (Signed)

## 2012-05-11 ENCOUNTER — Encounter: Payer: Self-pay | Admitting: Internal Medicine

## 2012-05-11 LAB — VITAMIN D 25 HYDROXY (VIT D DEFICIENCY, FRACTURES): Vit D, 25-Hydroxy: 101 ng/mL — ABNORMAL HIGH (ref 30–89)

## 2012-05-11 NOTE — Assessment & Plan Note (Signed)
For vit d , PTH and dxa f/u

## 2012-05-11 NOTE — Assessment & Plan Note (Signed)
For f/u today

## 2012-05-11 NOTE — Progress Notes (Signed)
Subjective:    Patient ID: Cindy Higgins, female    DOB: 03-31-1946, 66 y.o.   MRN: [id number]  HPI Here for wellness and f/u;  Overall doing ok;  Pt denies CP, worsening SOB, DOE, wheezing, orthopnea, PND, worsening LE edema, palpitations, dizziness or syncope.  Pt denies neurological change such as new Headache, facial or extremity weakness.  Pt denies polydipsia, polyuria, or low sugar symptoms. Pt states overall good compliance with treatment and medications, good tolerability, and trying to follow lower cholesterol diet.  Pt denies worsening depressive symptoms, suicidal ideation or panic. No fever, wt loss, night sweats, loss of appetite, or other constitutional symptoms.  Pt states good ability with ADL's, low fall risk, home safety reviewed and adequate, no significant changes in hearing or vision, and occasionally active with exercise.  Has been taking the Vit d 2000 units daily for 4 mo, has had dxa with GYN most recently. Past Medical History  Diagnosis Date  . ALLERGIC RHINITIS 12/22/2007  . ANXIETY DEPRESSION 08/30/2007  . BLURRED VISION, INTERMITTENT 12/22/2007  . COLONIC POLYPS, HX OF 12/22/2007  . GLUCOSE INTOLERANCE 07/08/2009  . Headache 12/22/2007  . HYPERCHOLESTEROLEMIA 08/30/2007  . HYPERLIPIDEMIA 03/26/2008  . MITRAL VALVE PROLAPSE, HX OF 08/30/2007  . OSTEOPOROSIS 12/22/2007  . POSTMENOPAUSAL STATUS 08/30/2007  . SINUSITIS- ACUTE-NOS 02/06/2010  . Impaired glucose tolerance 05/09/2011  . Herpes zoster of eye 05/10/2011   Past Surgical History  Procedure Date  . Abdominal hysterectomy   . Dilation and curettage of uterus 2007  . Oophorectomy     reports that she has never smoked. She does not have any smokeless tobacco history on file. She reports that she does not drink alcohol or use illicit drugs. family history includes Alcohol abuse in her father; Breast cancer in her maternal grandmother and sister; Diabetes in her mother; Heart disease in her mother; Hyperlipidemia in  her mother; and Testicular cancer in her son. Allergies  Allergen Reactions  . Penicillins Rash    Amoxicillin reaction   Current Outpatient Prescriptions on File Prior to Visit  Medication Sig Dispense Refill  . citalopram (CELEXA) 40 MG tablet Take 1 tablet (40 mg total) by mouth daily.  90 tablet  3  . estrogens, conjugated, (PREMARIN) 0.3 MG tablet Take 0.3 mg by mouth daily. Take daily for 21 days then do not take for 7 days.        Review of Systems Review of Systems  Constitutional: Negative for diaphoresis, activity change, appetite change and unexpected weight change.  HENT: Negative for hearing loss, ear pain, facial swelling, mouth sores and neck stiffness.   Eyes: Negative for pain, redness and visual disturbance.  Respiratory: Negative for shortness of breath and wheezing.   Cardiovascular: Negative for chest pain and palpitations.  Gastrointestinal: Negative for diarrhea, blood in stool, abdominal distention and rectal pain.  Genitourinary: Negative for hematuria, flank pain and decreased urine volume.  Musculoskeletal: Negative for myalgias and joint swelling.  Skin: Negative for color change and wound.  Neurological: Negative for syncope and numbness.  Hematological: Negative for adenopathy.  Psychiatric/Behavioral: Negative for hallucinations, self-injury, decreased concentration and agitation.     Objective:   Physical Exam BP 110/72  Pulse 92  Temp 97.7 F (36.5 C) (Oral)  Ht 5\' 4"  (1.626 m)  Wt 126 lb 8 oz (57.38 kg)  BMI 21.71 kg/m2  SpO2 96% Physical Exam  VS noted Constitutional: Pt is oriented to person, place, and time. Appears well-developed and well-nourished.  HENT:  Head: Normocephalic and atraumatic.  Right Ear: External ear normal.  Left Ear: External ear normal.  Nose: Nose normal.  Mouth/Throat: Oropharynx is clear and moist.  Eyes: Conjunctivae and EOM are normal. Pupils are equal, round, and reactive to light.  Neck: Normal range of  motion. Neck supple. No JVD present. No tracheal deviation present.  Cardiovascular: Normal rate, regular rhythm, normal heart sounds and intact distal pulses.   Pulmonary/Chest: Effort normal and breath sounds normal.  Abdominal: Soft. Bowel sounds are normal. There is no tenderness.  Musculoskeletal: Normal range of motion. Exhibits no edema.  Lymphadenopathy:  Has no cervical adenopathy.  Neurological: Pt is alert and oriented to person, place, and time. Pt has normal reflexes. No cranial nerve deficit.  Skin: Skin is warm and dry. No rash noted.  Psychiatric:  Has  normal mood and affect. Behavior is normal.     Assessment & Plan:

## 2012-05-12 LAB — PTH, INTACT AND CALCIUM
Calcium, Total (PTH): 10.2 mg/dL (ref 8.4–10.5)
PTH: 16.5 pg/mL (ref 14.0–72.0)

## 2012-05-15 ENCOUNTER — Encounter: Payer: Self-pay | Admitting: Internal Medicine

## 2012-11-28 ENCOUNTER — Other Ambulatory Visit: Payer: Self-pay

## 2012-11-28 MED ORDER — ALPRAZOLAM 0.5 MG PO TABS: 0.5000 mg | ORAL_TABLET | Freq: Two times a day (BID) | ORAL | Status: DC | PRN

## 2012-11-28 NOTE — Telephone Encounter (Signed)
Done hardcopy to robin  

## 2012-11-28 NOTE — Telephone Encounter (Signed)
Faxed hardcopy to pharmacy. 

## 2012-12-02 ENCOUNTER — Ambulatory Visit (INDEPENDENT_AMBULATORY_CARE_PROVIDER_SITE_OTHER): Payer: Managed Care, Other (non HMO) | Admitting: Emergency Medicine

## 2012-12-02 VITALS — BP 145/80 | HR 76 | Temp 98.3°F | Resp 18 | Wt 130.0 lb

## 2012-12-02 DIAGNOSIS — F419 Anxiety disorder, unspecified: Secondary | ICD-10-CM

## 2012-12-02 DIAGNOSIS — F411 Generalized anxiety disorder: Secondary | ICD-10-CM

## 2012-12-02 NOTE — Progress Notes (Signed)
[facility] [address] [place] [address] [phone]  Date:  12/02/2012   Name:  Cindy Higgins   DOB:  1946/07/06   MRN:  [id number]  PCP:  Oliver Barre, MD    Chief Complaint: withdrawals   History of Present Illness:  Cindy Higgins is a 67 y.o. very pleasant female patient who presents with the following:  Changed her prescription medication to a different pharmacy and they refilled her celexa but not her xanax.  She is out of xanax and was not out of celexa.  When she received the refill, she did not realize that she was taking two doses of celexa from two different generic manufacturers and was off the xanax for the past five days.  She now feels rather foggy and confused at times.    Patient Active Problem List  Diagnosis  . HYPERCHOLESTEROLEMIA  . ANXIETY DEPRESSION  . BLURRED VISION, INTERMITTENT  . ALLERGIC RHINITIS  . POSTMENOPAUSAL STATUS  . OSTEOPOROSIS  . Headache  . MITRAL VALVE PROLAPSE, HX OF  . COLONIC POLYPS, HX OF  . ARM PAIN, LEFT  . Impaired glucose tolerance  . Preventative health care  . Herpes zoster of eye  . Vitamin d deficiency  . Vitamin d deficiency    Past Medical History  Diagnosis Date  . ALLERGIC RHINITIS 12/22/2007  . ANXIETY DEPRESSION 08/30/2007  . BLURRED VISION, INTERMITTENT 12/22/2007  . COLONIC POLYPS, HX OF 12/22/2007  . GLUCOSE INTOLERANCE 07/08/2009  . Headache 12/22/2007  . HYPERCHOLESTEROLEMIA 08/30/2007  . HYPERLIPIDEMIA 03/26/2008  . MITRAL VALVE PROLAPSE, HX OF 08/30/2007  . OSTEOPOROSIS 12/22/2007  . POSTMENOPAUSAL STATUS 08/30/2007  . SINUSITIS- ACUTE-NOS 02/06/2010  . Impaired glucose tolerance 05/09/2011  . Herpes zoster of eye 05/10/2011  . Anxiety     Past Surgical History  Procedure Date  . Abdominal hysterectomy   . Dilation and curettage of uterus 2007  . Oophorectomy   . Cosmetic surgery     History  Substance Use Topics  . Smoking status: Never Smoker   . Smokeless tobacco: Not  on file  . Alcohol Use: 1.0 oz/week    2 drink(s) per week     Comment: daily    Family History  Problem Relation Age of Onset  . Heart disease Mother   . Diabetes Mother   . Hyperlipidemia Mother   . Alcohol abuse Father   . Breast cancer Sister   . Cancer Sister   . Testicular cancer Son   . Breast cancer Maternal Grandmother     Allergies  Allergen Reactions  . Penicillins Rash    Amoxicillin reaction    Medication list has been reviewed and updated.  Current Outpatient Prescriptions on File Prior to Visit  Medication Sig Dispense Refill  . ALPRAZolam (XANAX) 0.5 MG tablet Take 1 tablet (0.5 mg total) by mouth 2 (two) times daily as needed.  60 tablet  5  . citalopram (CELEXA) 40 MG tablet Take 1 tablet (40 mg total) by mouth daily.  90 tablet  3  . estrogens, conjugated, (PREMARIN) 0.3 MG tablet Take 0.3 mg by mouth daily. Take daily for 21 days then do not take for 7 days.         Review of Systems:  As per HPI, otherwise negative.    Physical Examination: Filed Vitals:   12/02/12 1454  BP: 145/80  Pulse: 76  Temp: 98.3 F (36.8 C)  Resp: 18   Filed Vitals:  12/02/12 1454  Weight: 130 lb (58.968 kg)   There is no height on file to calculate BMI. Ideal Body Weight:    GEN: WDWN, NAD, Non-toxic, A & O x 3 HEENT: Atraumatic, Normocephalic. Neck supple. No masses, No LAD. Ears and Nose: No external deformity. CV: RRR, No M/G/R. No JVD. No thrill. No extra heart sounds. PULM: CTA B, no wheezes, crackles, rhonchi. No retractions. No resp. distress. No accessory muscle use. ABD: S, NT, ND, +BS. No rebound. No HSM. EXTR: No c/c/e NEURO Normal gait.  PSYCH: Normally interactive. Conversant. Not depressed or anxious appearing.  Calm demeanor.    Assessment and Plan: celexa dosing misadventure Mild xanax withdrawal. Resume xanax  Skip tomorrow's dose of celexa  Carmelina Dane, MD

## 2013-04-07 ENCOUNTER — Emergency Department (HOSPITAL_COMMUNITY): Payer: Managed Care, Other (non HMO)

## 2013-04-07 ENCOUNTER — Encounter (HOSPITAL_COMMUNITY): Payer: Self-pay | Admitting: Emergency Medicine

## 2013-04-07 ENCOUNTER — Emergency Department (HOSPITAL_COMMUNITY)
Admission: EM | Admit: 2013-04-07 | Discharge: 2013-04-07 | Disposition: A | Discharge status: Home / Self Care | Payer: Managed Care, Other (non HMO) | Attending: Emergency Medicine | Admitting: Emergency Medicine

## 2013-04-07 DIAGNOSIS — E785 Hyperlipidemia, unspecified: Secondary | ICD-10-CM | POA: Insufficient documentation

## 2013-04-07 DIAGNOSIS — M810 Age-related osteoporosis without current pathological fracture: Secondary | ICD-10-CM | POA: Insufficient documentation

## 2013-04-07 DIAGNOSIS — F3289 Other specified depressive episodes: Secondary | ICD-10-CM | POA: Insufficient documentation

## 2013-04-07 DIAGNOSIS — Z8679 Personal history of other diseases of the circulatory system: Secondary | ICD-10-CM | POA: Insufficient documentation

## 2013-04-07 DIAGNOSIS — Z860100 Personal history of colon polyps, unspecified: Secondary | ICD-10-CM | POA: Insufficient documentation

## 2013-04-07 DIAGNOSIS — R109 Unspecified abdominal pain: Secondary | ICD-10-CM

## 2013-04-07 DIAGNOSIS — Z79899 Other long term (current) drug therapy: Secondary | ICD-10-CM | POA: Insufficient documentation

## 2013-04-07 DIAGNOSIS — E7800 Pure hypercholesterolemia, unspecified: Secondary | ICD-10-CM | POA: Insufficient documentation

## 2013-04-07 DIAGNOSIS — Z8669 Personal history of other diseases of the nervous system and sense organs: Secondary | ICD-10-CM | POA: Insufficient documentation

## 2013-04-07 DIAGNOSIS — F329 Major depressive disorder, single episode, unspecified: Secondary | ICD-10-CM | POA: Insufficient documentation

## 2013-04-07 DIAGNOSIS — F411 Generalized anxiety disorder: Secondary | ICD-10-CM | POA: Insufficient documentation

## 2013-04-07 LAB — URINALYSIS, ROUTINE W REFLEX MICROSCOPIC
Glucose, UA: NEGATIVE mg/dL
Hgb urine dipstick: NEGATIVE
Protein, ur: NEGATIVE mg/dL
Specific Gravity, Urine: 1.001 — ABNORMAL LOW (ref 1.005–1.030)

## 2013-04-07 LAB — COMPREHENSIVE METABOLIC PANEL
ALT: 18 U/L (ref 0–35)
AST: 32 U/L (ref 0–37)
Alkaline Phosphatase: 74 U/L (ref 39–117)
CO2: 25 mEq/L (ref 19–32)
Calcium: 9.3 mg/dL (ref 8.4–10.5)
Chloride: 95 mEq/L — ABNORMAL LOW (ref 96–112)
GFR calc non Af Amer: >90 mL/min (ref >90)
Potassium: 5.1 mEq/L (ref 3.5–5.1)
Sodium: 132 mEq/L — ABNORMAL LOW (ref 135–145)

## 2013-04-07 LAB — CBC WITH DIFFERENTIAL/PLATELET
Basophils Absolute: 0 10*3/uL (ref 0.0–0.1)
Eosinophils Relative: 1 % (ref 0–5)
Lymphocytes Relative: 18 % (ref 12–46)
Neutro Abs: 6.1 10*3/uL (ref 1.7–7.7)
Neutrophils Relative %: 72 % (ref 43–77)
Platelets: 248 10*3/uL (ref 150–400)
RBC: 4.06 MIL/uL (ref 3.87–5.11)
RDW: 12.8 % (ref 11.5–15.5)
WBC: 8.4 10*3/uL (ref 4.0–10.5)

## 2013-04-07 MED ORDER — IOHEXOL 300 MG/ML  SOLN
50.0000 mL | Freq: Once | INTRAMUSCULAR | Status: AC | PRN
  Administered 2013-04-07: 50 mL via ORAL

## 2013-04-07 MED ORDER — IOHEXOL 300 MG/ML  SOLN
100.0000 mL | Freq: Once | INTRAMUSCULAR | Status: AC | PRN
  Administered 2013-04-07: 100 mL via INTRAVENOUS

## 2013-04-07 MED ORDER — SODIUM CHLORIDE 0.9 % IV SOLN
INTRAVENOUS | Status: DC
Start: 2013-04-07 — End: 2013-04-07
  Administered 2013-04-07: 20 mL/h via INTRAVENOUS

## 2013-04-07 NOTE — ED Notes (Signed)
Pt here for c/o lower lt abd pain that radiates to the lt back and down her lt leg x5 months

## 2013-04-07 NOTE — ED Provider Notes (Signed)
History     CSN: [id number]  Arrival date & time 04/07/13  0814   First MD Initiated Contact with Patient 04/07/13 [phone]      No chief complaint on file.   (Consider location/radiation/quality/duration/timing/severity/associated sxs/prior treatment) Patient is a 67 y.o. female presenting with abdominal pain. The history is provided by the patient.  Abdominal Pain This is a chronic problem. The current episode started more than 1 week ago. The problem occurs constantly. The problem has been gradually worsening. Associated symptoms include abdominal pain. Nothing relieves the symptoms. She has tried nothing for the symptoms.   H/o abdominal pain at her llq with associated diarrhea and loose stools--no fever, vomiting, or urinary sx--worse also with movements--no tx used pta--ate a fatty meal recently and sx became worse Past Medical History  Diagnosis Date  . ALLERGIC RHINITIS 12/22/2007  . ANXIETY DEPRESSION 08/30/2007  . BLURRED VISION, INTERMITTENT 12/22/2007  . COLONIC POLYPS, HX OF 12/22/2007  . GLUCOSE INTOLERANCE 07/08/2009  . Headache 12/22/2007  . HYPERCHOLESTEROLEMIA 08/30/2007  . HYPERLIPIDEMIA 03/26/2008  . MITRAL VALVE PROLAPSE, HX OF 08/30/2007  . OSTEOPOROSIS 12/22/2007  . POSTMENOPAUSAL STATUS 08/30/2007  . SINUSITIS- ACUTE-NOS 02/06/2010  . Impaired glucose tolerance 05/09/2011  . Herpes zoster of eye 05/10/2011  . Anxiety     Past Surgical History  Procedure Laterality Date  . Abdominal hysterectomy    . Dilation and curettage of uterus  2007  . Oophorectomy    . Cosmetic surgery      Family History  Problem Relation Age of Onset  . Heart disease Mother   . Diabetes Mother   . Hyperlipidemia Mother   . Alcohol abuse Father   . Breast cancer Sister   . Cancer Sister   . Testicular cancer Son   . Breast cancer Maternal Grandmother     History  Substance Use Topics  . Smoking status: Never Smoker   . Smokeless tobacco: Not on file  . Alcohol Use: 1.0  oz/week    2 drink(s) per week     Comment: daily    OB History   Grav Para Term Preterm Abortions TAB SAB Ect Mult Living                  Review of Systems  Gastrointestinal: Positive for abdominal pain.  All other systems reviewed and are negative.    Allergies  Penicillins  Home Medications   Current Outpatient Rx  Name  Route  Sig  Dispense  Refill  . ALPRAZolam (XANAX) 0.5 MG tablet   Oral   Take 1 tablet (0.5 mg total) by mouth 2 (two) times daily as needed.   60 tablet   5   . citalopram (CELEXA) 40 MG tablet   Oral   Take 1 tablet (40 mg total) by mouth daily.   90 tablet   3   . estrogens, conjugated, (PREMARIN) 0.3 MG tablet   Oral   Take 0.3 mg by mouth daily. Take daily for 21 days then do not take for 7 days.            BP 128/62  Pulse 94  Temp(Src) 99.6 F (37.6 C) (Oral)  Resp 20  SpO2 98%  Physical Exam  Nursing note and vitals reviewed. Constitutional: She is oriented to person, place, and time. She appears well-developed and well-nourished.  Non-toxic appearance. No distress.  HENT:  Head: Normocephalic and atraumatic.  Eyes: Conjunctivae, EOM and lids are normal. Pupils are equal, round, and  reactive to light.  Neck: Normal range of motion. Neck supple. No tracheal deviation present. No mass present.  Cardiovascular: Normal rate, regular rhythm and normal heart sounds.  Exam reveals no gallop.   No murmur heard. Pulmonary/Chest: Effort normal and breath sounds normal. No stridor. No respiratory distress. She has no decreased breath sounds. She has no wheezes. She has no rhonchi. She has no rales.  Abdominal: Soft. Normal appearance and bowel sounds are normal. She exhibits no distension. There is tenderness in the left lower quadrant. There is no rigidity, no rebound, no guarding and no CVA tenderness.  Musculoskeletal: Normal range of motion. She exhibits no edema and no tenderness.  Neurological: She is alert and oriented to  person, place, and time. She has normal strength. No cranial nerve deficit or sensory deficit. GCS eye subscore is 4. GCS verbal subscore is 5. GCS motor subscore is 6.  Skin: Skin is warm and dry. No abrasion and no rash noted.  Psychiatric: She has a normal mood and affect. Her speech is normal and behavior is normal.    ED Course  Procedures (including critical care time)  Labs Reviewed  CBC WITH DIFFERENTIAL  COMPREHENSIVE METABOLIC PANEL  LIPASE, BLOOD  URINALYSIS, ROUTINE W REFLEX MICROSCOPIC   No results found.   No diagnosis found.    MDM  Workup here negative, pt stable for d/c        Toy Baker, MD 04/07/13 1156

## 2013-04-07 NOTE — ED Notes (Signed)
Lab called. Need a new order and sample of urine, urine leaked in sending tube

## 2013-04-09 ENCOUNTER — Ambulatory Visit (INDEPENDENT_AMBULATORY_CARE_PROVIDER_SITE_OTHER): Payer: Managed Care, Other (non HMO) | Admitting: Physician Assistant

## 2013-04-09 ENCOUNTER — Other Ambulatory Visit: Payer: Managed Care, Other (non HMO)

## 2013-04-09 ENCOUNTER — Encounter: Payer: Self-pay | Admitting: Physician Assistant

## 2013-04-09 ENCOUNTER — Telehealth: Payer: Self-pay | Admitting: Internal Medicine

## 2013-04-09 VITALS — BP 118/68 | HR 68 | Temp 98.0°F | Ht 64.0 in | Wt 127.6 lb

## 2013-04-09 DIAGNOSIS — R110 Nausea: Secondary | ICD-10-CM

## 2013-04-09 DIAGNOSIS — R197 Diarrhea, unspecified: Secondary | ICD-10-CM

## 2013-04-09 MED ORDER — DICYCLOMINE HCL 10 MG PO CAPS: ORAL_CAPSULE | ORAL | Status: DC

## 2013-04-09 MED ORDER — METRONIDAZOLE 250 MG PO TABS: ORAL_TABLET | ORAL | Status: DC

## 2013-04-09 MED ORDER — ONDANSETRON HCL 4 MG PO TABS: ORAL_TABLET | ORAL | Status: DC

## 2013-04-09 NOTE — Telephone Encounter (Signed)
Patient was seen in the ER yesterday.  They did a CT and it was normal as were the labs.  She c/o LLQ pain and diarrhea.  She will come in and see Mike Gip PA today at 9:30

## 2013-04-09 NOTE — Progress Notes (Signed)
I agree with all above including empirical Flagyl.

## 2013-04-09 NOTE — Patient Instructions (Addendum)
Please go to the basement level to our lab for stool studies.  We sent prescriptions to [facility] [address] [facility]:  1. Zofran  2. Bentyl 3. Flagyl

## 2013-04-09 NOTE — Progress Notes (Signed)
Subjective:    Patient ID: Cindy Higgins, female    DOB: 08-01-1946, 67 y.o.   MRN: [id number]  HPI  Cindy Higgins is a very nice 67 year old white female known to Dr. Lina Sar from prior colonoscopy. She last had colonoscopy in 2007 and was found to have one hyperplastic polyp in an otherwise negative exam. Patient comes in today as an urgent add-on with an acute illness over the past 10 days with symptoms of nausea abdominal pain cramping and profuse diarrhea. She had an emergency room visit yesterday 04/07/2013 and had a CBC and CMET  both of which were unremarkable. She underwent CT of the abdomen and pelvis which was negative. She states she was given a liter of fluids but was not sent home with any medications. She says she had onset of her illness about 10 days ago which started with malodorous loose stools and gas. She says that progressed to watery diarrhea which she is now had over the past several days having about 8 or 9 bowel movements per day including one or 2 nocturnal episodes of diarrhea. She says every time she eats she has urgency for bowel movement and things go "straight through". She been having ongoing abdominal discomfort and cramping says she's not sleeping well, and feels very fatigued . She has had some low-grade fevers at home and chills. Her appetite is poor- she has been nauseated but has not been vomiting. She has not been on any new medication nor any antibiotics in the past several months. She has not had any infectious exposures .    Review of Systems  Constitutional: Positive for chills, appetite change and fatigue.  HENT: Negative.   Eyes: Negative.   Respiratory: Negative.   Cardiovascular: Negative.   Gastrointestinal: Positive for nausea, abdominal pain and diarrhea.  Endocrine: Negative.   Genitourinary: Negative.   Musculoskeletal: Negative.   Skin: Negative.   Allergic/Immunologic: Negative.   Neurological: Positive for weakness.  Hematological:  Negative.   Psychiatric/Behavioral: Negative.    Outpatient Prescriptions Prior to Visit  Medication Sig Dispense Refill  . ALPRAZolam (XANAX) 0.5 MG tablet Take 1 tablet (0.5 mg total) by mouth 2 (two) times daily as needed.  60 tablet  5  . citalopram (CELEXA) 40 MG tablet Take 1 tablet (40 mg total) by mouth daily.  90 tablet  3  . estrogens, conjugated, (PREMARIN) 0.3 MG tablet Take 0.3 mg by mouth daily.        No facility-administered medications prior to visit.   Allergies  Allergen Reactions  . Scallops (Shellfish Allergy) Nausea And Vomiting  . Codeine Itching and Rash  . Penicillins Itching       Patient Active Problem List   Diagnosis Date Noted  . Vitamin d deficiency 05/10/2012  . Vitamin d deficiency 01/26/2012  . Herpes zoster of eye 05/10/2011  . Impaired glucose tolerance 05/09/2011  . Preventative health care 05/09/2011  . ARM PAIN, LEFT 12/24/2010  . BLURRED VISION, INTERMITTENT 12/22/2007  . ALLERGIC RHINITIS 12/22/2007  . OSTEOPOROSIS 12/22/2007  . Headache 12/22/2007  . COLONIC POLYPS, HX OF 12/22/2007  . HYPERCHOLESTEROLEMIA 08/30/2007  . ANXIETY DEPRESSION 08/30/2007  . POSTMENOPAUSAL STATUS 08/30/2007  . MITRAL VALVE PROLAPSE, HX OF 08/30/2007   History  Substance Use Topics  . Smoking status: Never Smoker   . Smokeless tobacco: Never Used  . Alcohol Use: 1.0 oz/week    2 drink(s) per week     Comment: daily   family history includes Alcohol abuse  in her father; Breast cancer in her maternal grandmother and sister; Cancer in her sister; Diabetes in her mother; Heart disease in her mother; Hyperlipidemia in her mother; and Testicular cancer in her son.  Objective:   Physical Exam  well-developed white female in no acute distress, pleasant blood pressure 118/68 pulse 98 temp 98 height 5 foot 4 weight 127. HEENT; nontraumatic normocephalic EOMI PERRLA sclera anicteric, Neck; supple, no JVD, Cardiovascular; regular rate and rhythm with S1-S2 mild  tachycardia, Pulmonary; clear bilaterally, Abdomen; soft bowel sounds are active she has fairly generalized tenderness is no guarding or rebound no palpable mass or hepatosplenomegaly, Rectal; exam not done, Extremities; no clubbing cyanosis or edema skin warm and dry, Psych; mood and affect normal and appropriate.        Assessment & Plan:  #43 67 year old female with acute diarrheal illness x10 days associated with nausea abdominal pain cramping malodorous stools low-grade fever and generalized weakness. Workup in the ER with baseline labs and CT was negative. This is very likely an infectious etiology rule out viral versus bacterial and also need to consider C. Difficile. #2 history of hyperplastic polyps on colonoscopy 2007 due for followup 2017  Plan; home to rest, patient needs to push oral fluids and have advised a very soft bland diet over the next few days until taking by mouth consistently. Will check stool for C. difficile PCR and stool lactoferrin Start Zofran 4 mg every 6 hours as needed for nausea Start Bentyl 10 mg 3-4 times daily as needed for cramping and pain Start empiric Flagyl 250 mg by mouth 4 times daily x14 days Further plans pending cultures and response to above.

## 2013-04-10 LAB — FECAL LACTOFERRIN, QUANT: Lactoferrin: POSITIVE

## 2013-04-11 LAB — CLOSTRIDIUM DIFFICILE BY PCR: Toxigenic C. Difficile by PCR: NOT DETECTED

## 2013-04-17 ENCOUNTER — Telehealth: Payer: Self-pay | Admitting: Physician Assistant

## 2013-04-17 DIAGNOSIS — R197 Diarrhea, unspecified: Secondary | ICD-10-CM

## 2013-04-17 DIAGNOSIS — R110 Nausea: Secondary | ICD-10-CM

## 2013-04-17 MED ORDER — METRONIDAZOLE 250 MG PO TABS: ORAL_TABLET | ORAL | Status: DC

## 2013-04-17 NOTE — Telephone Encounter (Signed)
Lomotil 1 po qhs, #30, 1 refill 

## 2013-04-17 NOTE — Telephone Encounter (Signed)
Patient states she is taking Flagyl QID. She has a week's worth left to take. She is still having diarrhea in the AM. Please, advise

## 2013-04-17 NOTE — Telephone Encounter (Signed)
Spoke with patient and she saw Mike Gip, PA last week for diarrhea and abdominal pain. Her stool study was negative for C. Diff. She was given Flagyl which she took. Diarrhea went away until Sunday. On Sunday and yesterday, she had diarrhea x 2 in the AM. Today, she had diarrhea again. She did take Bentyl today. She is just not feeling "right." Does not have any energy and has lost 5 lbs. Please, advise.

## 2013-04-17 NOTE — Telephone Encounter (Signed)
Please give one more week of Flagyl 250 mg po bid, #14.

## 2013-04-17 NOTE — Telephone Encounter (Signed)
Lomotil 1 po qhs, #30, 1 refill

## 2013-04-18 MED ORDER — DIPHENOXYLATE-ATROPINE 2.5-0.025 MG PO TABS: 1.0000 | ORAL_TABLET | Freq: Every day | ORAL | Status: DC

## 2013-04-18 NOTE — Telephone Encounter (Signed)
Spoke with pt and she is aware. Script sent to the pharmacy. 

## 2013-04-20 ENCOUNTER — Other Ambulatory Visit (HOSPITAL_COMMUNITY): Payer: Self-pay | Admitting: Obstetrics and Gynecology

## 2013-04-20 DIAGNOSIS — Z1231 Encounter for screening mammogram for malignant neoplasm of breast: Secondary | ICD-10-CM

## 2013-04-30 ENCOUNTER — Encounter: Payer: Self-pay | Admitting: *Deleted

## 2013-04-30 ENCOUNTER — Telehealth: Payer: Self-pay | Admitting: Physician Assistant

## 2013-04-30 ENCOUNTER — Ambulatory Visit (INDEPENDENT_AMBULATORY_CARE_PROVIDER_SITE_OTHER): Payer: Managed Care, Other (non HMO) | Admitting: Physician Assistant

## 2013-04-30 VITALS — BP 120/70 | HR 80 | Ht 63.75 in | Wt 125.4 lb

## 2013-04-30 DIAGNOSIS — R634 Abnormal weight loss: Secondary | ICD-10-CM

## 2013-04-30 DIAGNOSIS — R197 Diarrhea, unspecified: Secondary | ICD-10-CM

## 2013-04-30 DIAGNOSIS — R109 Unspecified abdominal pain: Secondary | ICD-10-CM

## 2013-04-30 MED ORDER — MOVIPREP 100 G PO SOLR: 1.0000 | Freq: Once | ORAL | Status: DC

## 2013-04-30 NOTE — Progress Notes (Signed)
Reviewed, not clear if funstional or organic disease. I agree with colonoscopy, random biopsies. please, see if this week Friday  Afternoon or am is possible, I have opened  up my schedule for Friday, any availability in [facility]?

## 2013-04-30 NOTE — Progress Notes (Signed)
Subjective:    Patient ID: Cindy Higgins, female    DOB: [birth year], 67 y.o.   MRN: [id number]  HPI Cindy Higgins is a pleasant 67 year old white female known to Dr. Lina Sar. She was seen in the office on 04/09/2013 by myself and at that time had had a ten-day history of nausea, abdominal cramping and profuse diarrhea. She had had an ER visit because of those symptoms and had a negative CT scan. It was felt she most likely had an infectious gastroenteritis or colitis. She had stool for C. difficile done which was negative and a stool for lactoferrin which was positive. She was treated empirically with a course of Flagyl x10 days in addition to Bentyl and Zofran . She comes back in today because she states she really doesn't feel any better. She says her appetite has been very poor and she is lost about 5 pounds. Her energy level has also been significantly decreased. She's not had any fever or sweats. No nausea or vomiting. No melena or hematochezia. She's primarily having diarrhea in the mornings and today had 4 episodes. She says her stools had gotten somewhat less liquidy until yesterday and now are all liquid again she has mild cramping in her lower abdomen and left lower quadrant there she states she finished the Flagyl and took to Bentyl though she wasn't sure, to benefit this offered. Her last colonoscopy was done in 2007 she had one hyperplastic polyp at that time.    Review of Systems  Constitutional: Positive for appetite change, fatigue and unexpected weight change.  HENT: Negative.   Eyes: Negative.   Respiratory: Negative.   Cardiovascular: Negative.   Gastrointestinal: Positive for abdominal pain and diarrhea.  Genitourinary: Negative.   Musculoskeletal: Negative.   Skin: Negative.   Allergic/Immunologic: Negative.   Neurological: Negative.   Hematological: Negative.   Psychiatric/Behavioral: Negative.    Outpatient Prescriptions Prior to Visit  Medication Sig Dispense Refill   . ALPRAZolam (XANAX) 0.5 MG tablet Take 1 tablet (0.5 mg total) by mouth 2 (two) times daily as needed.  60 tablet  5  . citalopram (CELEXA) 40 MG tablet Take 1 tablet (40 mg total) by mouth daily.  90 tablet  3  . estrogens, conjugated, (PREMARIN) 0.3 MG tablet Take 0.3 mg by mouth daily.       . NON FORMULARY Apply topically. [facility] for rosea      . dicyclomine (BENTYL) 10 MG capsule Take 1 tab every 3-4 times daily for cramping and pain.  40 capsule  1  . diphenoxylate-atropine (LOMOTIL) 2.5-0.025 MG per tablet Take 1 tablet by mouth at bedtime.  30 tablet  0  . metroNIDAZOLE (FLAGYL) 250 MG tablet Take one po BID  14 tablet  0  . ondansetron (ZOFRAN) 4 MG tablet Take 1 tab every 6 hours as needed for nausea.  40 tablet  1   No facility-administered medications prior to visit.   Allergies  Allergen Reactions  . Scallops (Shellfish Allergy) Nausea And Vomiting  . Codeine Itching and Rash  . Penicillins Itching   Patient Active Problem List   Diagnosis Date Noted  . Vitamin d deficiency 05/10/2012  . Vitamin d deficiency 01/26/2012  . Herpes zoster of eye 05/10/2011  . Impaired glucose tolerance 05/09/2011  . Preventative health care 05/09/2011  . ARM PAIN, LEFT 12/24/2010  . BLURRED VISION, INTERMITTENT 12/22/2007  . ALLERGIC RHINITIS 12/22/2007  . OSTEOPOROSIS 12/22/2007  . Headache 12/22/2007  . COLONIC POLYPS, HX OF  12/22/2007  . HYPERCHOLESTEROLEMIA 08/30/2007  . ANXIETY DEPRESSION 08/30/2007  . POSTMENOPAUSAL STATUS 08/30/2007  . MITRAL VALVE PROLAPSE, HX OF 08/30/2007   History   Social History Narrative  . No narrative on file   family history includes Alcohol abuse in her father; Breast cancer in her maternal grandmother and sister; Cancer in her sister; Diabetes in her mother; Heart disease in her mother; Hyperlipidemia in her mother; and Testicular cancer in her son.     Objective:   Physical Exam well-developed white female in no acute distress, pleasant  blood pressure 120/70 pulse 80 height 5 foot 3 weight 125. HEENT; nontraumatic normocephalic EOMI PERRLA sclera anicteric,Neck; Supple no JVD, Cardiovascular; regular rate and rhythm with S1-S2 no murmur or gallop, Pulmonary; clear bilaterally, Abdomen; soft she is mildly tender in the left lower quadrant and suprapubic area there is no guarding or rebound no palpable mass or hepatosplenomegaly bowel sounds are active, Rectal ;exam not done,, Extremities; no clubbing cyanosis or edema skin warm and dry, Psych ;mood and affect normal and appropriate.        Assessment & Plan:  #69 67 year old female with 4 week history of lower Donald cramping, diarrhea, anorexia and weight loss. Patient has had no response to and and appear course of Flagyl. Stool for C. difficile PCR was negative Etiology of her symptoms is not clear, at this point infectious etiologies less likely due to long duration a symptoms, and will need to rule out an underlying IBD.  #2 history of hyperplastic colon polyps last colonoscopy 2007 #3 anxiety depression Plan; will check a stool pathogen panel for completeness, and schedule for colonoscopy with Dr. Juanda Chance. Procedure was discussed in detail with the patient and she is agreeable to proceed. She will use Lomotil on once or twice daily on a when necessary basis and will hold on any other medical therapy until stool pathogen panel has returned

## 2013-04-30 NOTE — Patient Instructions (Addendum)
Please go to the basement level to the lab for stool studies. We sent the prescription for the colonoscopy prep to [facility] [address]   You have been scheduled for a colonoscopy with propofol. Please follow written instructions given to you at your visit today.  Please pick up your prep kit at the pharmacy within the next 1-3 days. If you use inhalers (even only as needed), please bring them with you on the day of your procedure. Your physician has requested that you go to www.startemmi.com and enter the access code given to you at your visit today. This web site gives a general overview about your procedure. However, you should still follow specific instructions given to you by our office regarding your preparation for the procedure.

## 2013-04-30 NOTE — Progress Notes (Signed)
OK, thanx. If the patient has already been scheduled for another time then I will leave it there. DB

## 2013-04-30 NOTE — Telephone Encounter (Signed)
Patient saw Mike Gip, PA 3 weeks ago for diarrhea. States she completed the Flagyl on Friday and has been taking the Lomotil nightly as suggested by Dr. Juanda Chance. She continues to have diarrhea in AM that is foul smelling. She has lost 5 lbs and feels "yucky."  States that she continued to have the diarrhea even when on Flagyl and Lomotil. States she cannot continue this and feels something is wrong. Dr. Regino Schultze schedule is full for tomorrow and no appointments until 05/14/13. Patient wants to be seen before that. Scheduled today at 1:30 PM with Mike Gip, PA.

## 2013-05-01 ENCOUNTER — Encounter: Payer: Self-pay | Admitting: Internal Medicine

## 2013-05-01 ENCOUNTER — Other Ambulatory Visit: Payer: Managed Care, Other (non HMO)

## 2013-05-01 DIAGNOSIS — R197 Diarrhea, unspecified: Secondary | ICD-10-CM

## 2013-05-01 DIAGNOSIS — R634 Abnormal weight loss: Secondary | ICD-10-CM

## 2013-05-01 DIAGNOSIS — R109 Unspecified abdominal pain: Secondary | ICD-10-CM

## 2013-05-02 LAB — GASTROINTESTINAL PATHOGEN PANEL PCR
C. difficile Tox A/B, PCR: NEGATIVE
Cryptosporidium, PCR: NEGATIVE
E coli 0157, PCR: NEGATIVE
Giardia lamblia, PCR: NEGATIVE

## 2013-05-15 ENCOUNTER — Ambulatory Visit (HOSPITAL_COMMUNITY): Payer: Managed Care, Other (non HMO)

## 2013-05-18 ENCOUNTER — Encounter: Payer: Self-pay | Admitting: Internal Medicine

## 2013-05-18 ENCOUNTER — Ambulatory Visit (AMBULATORY_SURGERY_CENTER): Payer: Managed Care, Other (non HMO) | Admitting: Internal Medicine

## 2013-05-18 VITALS — BP 101/65 | HR 74 | Temp 97.6°F | Resp 18 | Ht 63.75 in | Wt 125.0 lb

## 2013-05-18 DIAGNOSIS — Z1211 Encounter for screening for malignant neoplasm of colon: Secondary | ICD-10-CM

## 2013-05-18 DIAGNOSIS — Z86010 Personal history of colonic polyps: Secondary | ICD-10-CM

## 2013-05-18 DIAGNOSIS — R197 Diarrhea, unspecified: Secondary | ICD-10-CM

## 2013-05-18 MED ORDER — SODIUM CHLORIDE 0.9 % IV SOLN: 500.0000 mL | INTRAVENOUS | Status: DC

## 2013-05-18 NOTE — Progress Notes (Signed)
Stable to RR 

## 2013-05-18 NOTE — Op Note (Signed)
[facility] [address] [place], [address]   COLONOSCOPY PROCEDURE REPORT  PATIENT: Cindy, Higgins  MR#: [id number] BIRTHDATE: 1946-08-19 , 66  yrs. old GENDER: Female ENDOSCOPIST: Hart Carwin, MD REFERRED BY:  Oliver Barre, M.D. PROCEDURE DATE:  05/18/2013 PROCEDURE:   Colonoscopy, surveillance ASA CLASS:   Class II INDICATIONS:last colonoscopy in 2007- hyperplastic polyp,. MEDICATIONS: MAC sedation, administered by CRNA and propofol (Diprivan) 300mg  IV  DESCRIPTION OF PROCEDURE:   After the risks and benefits and of the procedure were explained, informed consent was obtained.  A digital rectal exam revealed no abnormalities of the rectum.    The LB [id number]  endoscope was introduced through the anus and advanced to the cecum, which was identified by both the appendix and ileocecal valve .  The quality of the prep was good, using MoviPrep .  The instrument was then slowly withdrawn as the colon was fully examined.     COLON FINDINGS: A normal appearing cecum, ileocecal valve, and appendiceal orifice were identified.  The ascending, hepatic flexure, transverse, splenic flexure, descending, sigmoid colon and rectum appeared unremarkable.  No polyps or cancers were seen. Retroflexed views revealed no abnormalities.     The scope was then withdrawn from the patient and the procedure completed.  COMPLICATIONS: There were no complications. ENDOSCOPIC IMPRESSION: Normal colon  RECOMMENDATIONS: High fiber diet   REPEAT EXAM: 10 years  cc:  _______________________________ eSignedHart Carwin, MD 05/18/2013 9:41 AM

## 2013-05-18 NOTE — Progress Notes (Signed)
Patient did not experience any of the following events: a burn prior to discharge; a fall within the facility; wrong site/side/patient/procedure/implant event; or a hospital transfer or hospital admission upon discharge from the facility. (G8907) Patient did not have preoperative order for IV antibiotic SSI prophylaxis. (G8918)  

## 2013-05-18 NOTE — Patient Instructions (Addendum)

## 2013-05-21 ENCOUNTER — Telehealth: Payer: Self-pay | Admitting: *Deleted

## 2013-05-21 NOTE — Telephone Encounter (Signed)
  Follow up Call-  Call back number 05/18/2013  Post procedure Call Back phone  # [phone]  Permission to leave phone message Yes     Patient questions:  Do you have a fever, pain , or abdominal swelling? no Pain Score  0 *  Have you tolerated food without any problems? yes  Have you been able to return to your normal activities? yes  Do you have any questions about your discharge instructions: Diet   no Medications  no Follow up visit  no  Do you have questions or concerns about your Care? no  Actions: * If pain score is 4 or above: No action needed, pain <4.

## 2013-05-22 ENCOUNTER — Ambulatory Visit (HOSPITAL_COMMUNITY)
Admission: RE | Admit: 2013-05-22 | Discharge: 2013-05-22 | Disposition: A | Discharge status: Home / Self Care | Payer: Managed Care, Other (non HMO) | Source: Ambulatory Visit | Attending: Obstetrics and Gynecology | Admitting: Obstetrics and Gynecology

## 2013-05-22 DIAGNOSIS — Z1231 Encounter for screening mammogram for malignant neoplasm of breast: Secondary | ICD-10-CM | POA: Insufficient documentation

## 2013-06-09 ENCOUNTER — Other Ambulatory Visit: Payer: Self-pay | Admitting: Internal Medicine

## 2013-06-13 ENCOUNTER — Other Ambulatory Visit (INDEPENDENT_AMBULATORY_CARE_PROVIDER_SITE_OTHER): Payer: Managed Care, Other (non HMO)

## 2013-06-13 DIAGNOSIS — Z0000 Encounter for general adult medical examination without abnormal findings: Secondary | ICD-10-CM

## 2013-06-13 DIAGNOSIS — E7800 Pure hypercholesterolemia, unspecified: Secondary | ICD-10-CM

## 2013-06-13 LAB — BASIC METABOLIC PANEL
BUN: 13 mg/dL (ref 6–23)
CO2: 28 mEq/L (ref 19–32)
Calcium: 9.3 mg/dL (ref 8.4–10.5)
Creatinine, Ser: 0.7 mg/dL (ref 0.4–1.2)
GFR: 90.81 mL/min (ref >60.00)
Glucose, Bld: 103 mg/dL — ABNORMAL HIGH (ref 70–99)

## 2013-06-13 LAB — CBC WITH DIFFERENTIAL/PLATELET
Basophils Absolute: 0 10*3/uL (ref 0.0–0.1)
Eosinophils Relative: 2 % (ref 0.0–5.0)
HCT: 40.8 % (ref 36.0–46.0)
Lymphs Abs: 1.8 10*3/uL (ref 0.7–4.0)
MCV: 101.9 fl — ABNORMAL HIGH (ref 78.0–100.0)
Monocytes Absolute: 0.5 10*3/uL (ref 0.1–1.0)
Platelets: 307 10*3/uL (ref 150.0–400.0)
RDW: 14.5 % (ref 11.5–14.6)

## 2013-06-13 LAB — URINALYSIS, ROUTINE W REFLEX MICROSCOPIC
Leukocytes, UA: NEGATIVE
RBC / HPF: NONE SEEN (ref 0–?)
Specific Gravity, Urine: 1.02 (ref 1.000–1.030)
Urobilinogen, UA: 0.2 (ref 0.0–1.0)
WBC, UA: NONE SEEN (ref 0–?)

## 2013-06-13 LAB — HEPATIC FUNCTION PANEL
ALT: 20 U/L (ref 0–35)
Albumin: 4.1 g/dL (ref 3.5–5.2)
Bilirubin, Direct: 0 mg/dL (ref 0.0–0.3)
Total Protein: 7.5 g/dL (ref 6.0–8.3)

## 2013-06-13 LAB — LIPID PANEL
Cholesterol: 260 mg/dL — ABNORMAL HIGH (ref 0–200)
HDL: 84.9 mg/dL (ref >39.00)
Triglycerides: 99 mg/dL (ref 0.0–149.0)

## 2013-06-19 ENCOUNTER — Encounter: Payer: Self-pay | Admitting: Internal Medicine

## 2013-06-19 ENCOUNTER — Ambulatory Visit (INDEPENDENT_AMBULATORY_CARE_PROVIDER_SITE_OTHER): Payer: Managed Care, Other (non HMO) | Admitting: Internal Medicine

## 2013-06-19 VITALS — BP 120/80 | HR 96 | Temp 97.8°F | Ht 64.0 in | Wt 130.5 lb

## 2013-06-19 DIAGNOSIS — F341 Dysthymic disorder: Secondary | ICD-10-CM

## 2013-06-19 DIAGNOSIS — M899 Disorder of bone, unspecified: Secondary | ICD-10-CM

## 2013-06-19 DIAGNOSIS — Z0000 Encounter for general adult medical examination without abnormal findings: Secondary | ICD-10-CM

## 2013-06-19 DIAGNOSIS — M8580 Other specified disorders of bone density and structure, unspecified site: Secondary | ICD-10-CM

## 2013-06-19 DIAGNOSIS — M949 Disorder of cartilage, unspecified: Secondary | ICD-10-CM

## 2013-06-19 MED ORDER — ALPRAZOLAM 0.5 MG PO TABS: 0.5000 mg | ORAL_TABLET | Freq: Two times a day (BID) | ORAL | Status: DC | PRN

## 2013-06-19 MED ORDER — CITALOPRAM HYDROBROMIDE 40 MG PO TABS: 40.0000 mg | ORAL_TABLET | Freq: Every day | ORAL | Status: DC

## 2013-06-19 NOTE — Assessment & Plan Note (Signed)
stable overall by history and exam, recent data reviewed with pt, and pt to continue medical treatment as before,  to f/u any worsening symptoms or concerns Lab Results  Component Value Date   WBC 5.0 06/13/2013   HGB 13.8 06/13/2013   HCT 40.8 06/13/2013   PLT 307.0 06/13/2013   GLUCOSE 103* 06/13/2013   CHOL 260* 06/13/2013   TRIG 99.0 06/13/2013   HDL 84.90 06/13/2013   LDLDIRECT 161.4 06/13/2013   LDLCALC 99 05/05/2012   ALT 20 06/13/2013   AST 20 06/13/2013   NA 139 06/13/2013   K 5.0 06/13/2013   CL 101 06/13/2013   CREATININE 0.7 06/13/2013   BUN 13 06/13/2013   CO2 28 06/13/2013   TSH 2.10 06/13/2013   HGBA1C 5.8 05/05/2012

## 2013-06-19 NOTE — Patient Instructions (Signed)
Please continue all other medications as before, and refills have been done if requested. Please have the pharmacy call with any other refills you may need. Please continue your efforts at being more active, low cholesterol diet, and weight control. You are otherwise up to date with prevention measures today. Please schedule the bone density test before leaving today at the scheduling desk (where you check out) Please keep your appointments with your specialists as you may have planned  Please remember to sign up for My Chart if you have not done so, as this will be important to you in the future with finding out test results, communicating by private email, and scheduling acute appointments online when needed.  Please return in 1 year for your yearly visit, or sooner if needed, with Lab testing done 3-5 days before

## 2013-06-19 NOTE — Progress Notes (Signed)
Subjective:    Patient ID: Cindy Higgins, female    DOB: 09/01/1946, 67 y.o.   MRN: [id number]  HPI  Here for wellness and f/u;  Overall doing ok;  Pt denies CP, worsening SOB, DOE, wheezing, orthopnea, PND, worsening LE edema, palpitations, dizziness or syncope.  Pt denies neurological change such as new headache, facial or extremity weakness.  Pt denies polydipsia, polyuria, or low sugar symptoms. Pt states overall good compliance with treatment and medications, good tolerability, and has been trying to follow lower cholesterol diet.  Pt denies worsening depressive symptoms, suicidal ideation or panic. No fever, night sweats, wt loss, loss of appetite, or other constitutional symptoms.  Pt states good ability with ADL's, has low fall risk, home safety reviewed and adequate, no other significant changes in hearing or vision, and only occasionally active with exercise.  Did have pain and diarrhea may 2014, CT neg, saw PA for Dr Marlowe Sh[place], tx for c diff, but contd x 3 wks despite tx.  Lomotil little help, then fortunately stopped suddenly.  Had some recurrence with the colonoscopy (neg) but then resolved again.  Had lost wt to 122, now back to 130. Now retired, states much less stress now.   Past Medical History  Diagnosis Date  . ALLERGIC RHINITIS 12/22/2007  . ANXIETY DEPRESSION 08/30/2007  . BLURRED VISION, INTERMITTENT 12/22/2007  . COLONIC POLYPS, HX OF 12/22/2007    01/18/2006  . GLUCOSE INTOLERANCE 07/08/2009  . Headache(784.0) 12/22/2007  . HYPERCHOLESTEROLEMIA 08/30/2007  . HYPERLIPIDEMIA 03/26/2008  . MITRAL VALVE PROLAPSE, HX OF 08/30/2007  . OSTEOPOROSIS 12/22/2007  . POSTMENOPAUSAL STATUS 08/30/2007  . SINUSITIS- ACUTE-NOS 02/06/2010  . Impaired glucose tolerance 05/09/2011  . Herpes zoster of eye 05/10/2011  . Anxiety    Past Surgical History  Procedure Laterality Date  . Abdominal hysterectomy    . Dilation and curettage of uterus  2007  . Oophorectomy    . Cosmetic surgery    .  Colonoscopy    . Polypectomy      reports that she has never smoked. She has never used smokeless tobacco. She reports that she drinks about 1.0 ounces of alcohol per week. She reports that she does not use illicit drugs. family history includes Alcohol abuse in her father; Breast cancer in her maternal grandmother and sister; Cancer in her sister; Diabetes in her mother; Heart disease in her mother; Hyperlipidemia in her mother; and Testicular cancer in her son. Allergies  Allergen Reactions  . Scallops (Shellfish Allergy) Nausea And Vomiting  . Codeine Itching and Rash  . Penicillins Itching   Current Outpatient Prescriptions on File Prior to Visit  Medication Sig Dispense Refill  . ALPRAZolam (XANAX) 0.5 MG tablet Take 1 tablet (0.5 mg total) by mouth 2 (two) times daily as needed.  60 tablet  5  . estrogens, conjugated, (PREMARIN) 0.3 MG tablet Take 0.3 mg by mouth daily.       . NON FORMULARY Apply topically. [facility] for rosea       No current facility-administered medications on file prior to visit.   Review of Systems Constitutional: Negative for diaphoresis, activity change, appetite change or unexpected weight change.  HENT: Negative for hearing loss, ear pain, facial swelling, mouth sores and neck stiffness.   Eyes: Negative for pain, redness and visual disturbance.  Respiratory: Negative for shortness of breath and wheezing.   Cardiovascular: Negative for chest pain and palpitations.  Gastrointestinal: Negative for diarrhea, blood in stool, abdominal distention or other pain  Genitourinary: Negative for hematuria, flank pain or change in urine volume.  Musculoskeletal: Negative for myalgias and joint swelling.  Skin: Negative for color change and wound.  Neurological: Negative for syncope and numbness. other than noted Hematological: Negative for adenopathy.  Psychiatric/Behavioral: Negative for hallucinations, self-injury, decreased concentration and agitation.       Objective:   Physical Exam BP 120/80  Pulse 96  Temp(Src) 97.8 F (36.6 C) (Oral)  Ht 5\' 4"  (1.626 m)  Wt 130 lb 8 oz (59.194 kg)  BMI 22.39 kg/m2  SpO2 97% VS noted,  Constitutional: Pt is oriented to person, place, and time. Appears well-developed and well-nourished.  Head: Normocephalic and atraumatic.  Right Ear: External ear normal.  Left Ear: External ear normal.  Nose: Nose normal.  Mouth/Throat: Oropharynx is clear and moist.  Eyes: Conjunctivae and EOM are normal. Pupils are equal, round, and reactive to light.  Neck: Normal range of motion. Neck supple. No JVD present. No tracheal deviation present.  Cardiovascular: Normal rate, regular rhythm, normal heart sounds and intact distal pulses.   Pulmonary/Chest: Effort normal and breath sounds normal.  Abdominal: Soft. Bowel sounds are normal. There is no tenderness. No HSM  Musculoskeletal: Normal range of motion. Exhibits no edema.  Lymphadenopathy:  Has no cervical adenopathy.  Neurological: Pt is alert and oriented to person, place, and time. Pt has normal reflexes. No cranial nerve deficit.  Skin: Skin is warm and dry. No rash noted.  Psychiatric:  Has  normal mood and affect. Behavior is normal.     Assessment & Plan:

## 2013-06-19 NOTE — Assessment & Plan Note (Signed)

## 2013-06-22 ENCOUNTER — Ambulatory Visit (INDEPENDENT_AMBULATORY_CARE_PROVIDER_SITE_OTHER)
Admission: RE | Admit: 2013-06-22 | Discharge: 2013-06-22 | Disposition: A | Discharge status: Home / Self Care | Payer: Managed Care, Other (non HMO) | Source: Ambulatory Visit | Attending: Internal Medicine | Admitting: Internal Medicine

## 2013-06-22 DIAGNOSIS — M8580 Other specified disorders of bone density and structure, unspecified site: Secondary | ICD-10-CM

## 2013-06-22 DIAGNOSIS — M949 Disorder of cartilage, unspecified: Secondary | ICD-10-CM

## 2013-06-22 DIAGNOSIS — M899 Disorder of bone, unspecified: Secondary | ICD-10-CM

## 2013-09-13 ENCOUNTER — Other Ambulatory Visit: Payer: Self-pay

## 2014-01-07 ENCOUNTER — Other Ambulatory Visit: Payer: Self-pay | Admitting: Internal Medicine

## 2014-01-08 NOTE — Telephone Encounter (Signed)
Faxed hardcopy to [facility].

## 2014-01-08 NOTE — Telephone Encounter (Signed)
Done hardcopy to robin  

## 2014-02-20 ENCOUNTER — Other Ambulatory Visit (HOSPITAL_COMMUNITY): Payer: Self-pay | Admitting: Obstetrics and Gynecology

## 2014-02-20 DIAGNOSIS — M8580 Other specified disorders of bone density and structure, unspecified site: Secondary | ICD-10-CM

## 2014-04-16 ENCOUNTER — Other Ambulatory Visit (HOSPITAL_COMMUNITY): Payer: Self-pay | Admitting: Obstetrics and Gynecology

## 2014-04-16 DIAGNOSIS — Z1231 Encounter for screening mammogram for malignant neoplasm of breast: Secondary | ICD-10-CM

## 2014-05-23 ENCOUNTER — Ambulatory Visit (HOSPITAL_COMMUNITY): Payer: Medicare Other

## 2014-05-30 ENCOUNTER — Other Ambulatory Visit (HOSPITAL_COMMUNITY): Payer: Self-pay | Admitting: Obstetrics and Gynecology

## 2014-05-30 ENCOUNTER — Ambulatory Visit (HOSPITAL_COMMUNITY)
Admission: RE | Admit: 2014-05-30 | Discharge: 2014-05-30 | Disposition: A | Discharge status: Home / Self Care | Payer: Medicare Other | Source: Ambulatory Visit | Attending: Obstetrics and Gynecology | Admitting: Obstetrics and Gynecology

## 2014-05-30 DIAGNOSIS — Z1231 Encounter for screening mammogram for malignant neoplasm of breast: Secondary | ICD-10-CM | POA: Insufficient documentation

## 2014-06-14 ENCOUNTER — Other Ambulatory Visit: Payer: Self-pay | Admitting: Internal Medicine

## 2014-06-14 MED ORDER — ALPRAZOLAM 0.5 MG PO TABS: 0.5000 mg | ORAL_TABLET | Freq: Two times a day (BID) | ORAL | Status: DC | PRN

## 2014-06-14 NOTE — Telephone Encounter (Signed)
Done hardcopy to robin  

## 2014-06-14 NOTE — Telephone Encounter (Signed)
Faxed hardcopy to Pharmacy.

## 2014-06-25 ENCOUNTER — Encounter: Payer: Self-pay | Admitting: Internal Medicine

## 2014-08-16 ENCOUNTER — Encounter: Payer: Self-pay | Admitting: Internal Medicine

## 2014-08-16 ENCOUNTER — Ambulatory Visit (INDEPENDENT_AMBULATORY_CARE_PROVIDER_SITE_OTHER): Payer: Medicare Other | Admitting: Internal Medicine

## 2014-08-16 ENCOUNTER — Other Ambulatory Visit (INDEPENDENT_AMBULATORY_CARE_PROVIDER_SITE_OTHER): Payer: Medicare Other

## 2014-08-16 VITALS — BP 120/74 | HR 77 | Temp 98.0°F | Ht 65.0 in | Wt 133.2 lb

## 2014-08-16 DIAGNOSIS — Z136 Encounter for screening for cardiovascular disorders: Secondary | ICD-10-CM

## 2014-08-16 DIAGNOSIS — E7800 Pure hypercholesterolemia, unspecified: Secondary | ICD-10-CM

## 2014-08-16 DIAGNOSIS — R7302 Impaired glucose tolerance (oral): Secondary | ICD-10-CM

## 2014-08-16 DIAGNOSIS — F341 Dysthymic disorder: Secondary | ICD-10-CM

## 2014-08-16 DIAGNOSIS — E559 Vitamin D deficiency, unspecified: Secondary | ICD-10-CM

## 2014-08-16 DIAGNOSIS — Z23 Encounter for immunization: Secondary | ICD-10-CM

## 2014-08-16 LAB — CBC WITH DIFFERENTIAL/PLATELET
BASOS ABS: 0 10*3/uL (ref 0.0–0.1)
Basophils Relative: 0.8 % (ref 0.0–3.0)
Eosinophils Absolute: 0.1 10*3/uL (ref 0.0–0.7)
Eosinophils Relative: 2.4 % (ref 0.0–5.0)
HCT: 40.4 % (ref 36.0–46.0)
Hemoglobin: 13.4 g/dL (ref 12.0–15.0)
Lymphocytes Relative: 35.3 % (ref 12.0–46.0)
Lymphs Abs: 1.9 10*3/uL (ref 0.7–4.0)
MCHC: 33.3 g/dL (ref 30.0–36.0)
MCV: 102.3 fl — AB (ref 78.0–100.0)
MONO ABS: 0.5 10*3/uL (ref 0.1–1.0)
Monocytes Relative: 9 % (ref 3.0–12.0)
Neutro Abs: 2.9 10*3/uL (ref 1.4–7.7)
Neutrophils Relative %: 52.5 % (ref 43.0–77.0)
PLATELETS: 303 10*3/uL (ref 150.0–400.0)
RBC: 3.95 Mil/uL (ref 3.87–5.11)
RDW: 13.2 % (ref 11.5–15.5)
WBC: 5.4 10*3/uL (ref 4.0–10.5)

## 2014-08-16 LAB — BASIC METABOLIC PANEL
BUN: 11 mg/dL (ref 6–23)
CO2: 27 mEq/L (ref 19–32)
Calcium: 9.2 mg/dL (ref 8.4–10.5)
Chloride: 103 mEq/L (ref 96–112)
Creatinine, Ser: 0.6 mg/dL (ref 0.4–1.2)
GFR: 110.56 mL/min (ref >60.00)
GLUCOSE: 97 mg/dL (ref 70–99)
POTASSIUM: 4.6 meq/L (ref 3.5–5.1)
SODIUM: 135 meq/L (ref 135–145)

## 2014-08-16 LAB — HEPATIC FUNCTION PANEL
ALBUMIN: 3.6 g/dL (ref 3.5–5.2)
ALK PHOS: 57 U/L (ref 39–117)
ALT: 26 U/L (ref 0–35)
AST: 29 U/L (ref 0–37)
Bilirubin, Direct: 0.1 mg/dL (ref 0.0–0.3)
Total Bilirubin: 0.6 mg/dL (ref 0.2–1.2)
Total Protein: 7.8 g/dL (ref 6.0–8.3)

## 2014-08-16 LAB — URINALYSIS, ROUTINE W REFLEX MICROSCOPIC
Bilirubin Urine: NEGATIVE
Hgb urine dipstick: NEGATIVE
KETONES UR: NEGATIVE
Leukocytes, UA: NEGATIVE
NITRITE: NEGATIVE
PH: 6 (ref 5.0–8.0)
Specific Gravity, Urine: 1.02 (ref 1.000–1.030)
TOTAL PROTEIN, URINE-UPE24: NEGATIVE
Urine Glucose: NEGATIVE
Urobilinogen, UA: 0.2 (ref 0.0–1.0)

## 2014-08-16 LAB — LIPID PANEL
CHOLESTEROL: 239 mg/dL — AB (ref 0–200)
HDL: 72.8 mg/dL (ref >39.00)
LDL Cholesterol: 147 mg/dL — ABNORMAL HIGH (ref 0–99)
NonHDL: 166.2
Total CHOL/HDL Ratio: 3
Triglycerides: 96 mg/dL (ref 0.0–149.0)
VLDL: 19.2 mg/dL (ref 0.0–40.0)

## 2014-08-16 LAB — TSH: TSH: 5.05 u[IU]/mL — AB (ref 0.35–4.50)

## 2014-08-16 LAB — HEMOGLOBIN A1C: HEMOGLOBIN A1C: 5.7 % (ref 4.6–6.5)

## 2014-08-16 MED ORDER — ALPRAZOLAM 0.5 MG PO TABS: 0.5000 mg | ORAL_TABLET | Freq: Two times a day (BID) | ORAL | Status: DC | PRN

## 2014-08-16 MED ORDER — CITALOPRAM HYDROBROMIDE 40 MG PO TABS: 40.0000 mg | ORAL_TABLET | Freq: Every day | ORAL | Status: DC

## 2014-08-16 NOTE — Patient Instructions (Addendum)
You had the flu shot today  [facility], and if you have not had the new Prevnar pneumonia shot, you can likely get it there.  Please continue all other medications as before, and refills have been done if requested.  Please have the pharmacy call with any other refills you may need.  Please continue your efforts at being more active, low cholesterol diet, and weight control.  You are otherwise up to date with prevention measures today.  Please keep your appointments with your specialists as you may have planned  Please go to the LAB in the Basement (turn left off the elevator) for the tests to be done today  You will be contacted by phone if any changes need to be made immediately.  Otherwise, you will receive a letter about your results with an explanation, but please check with MyChart first.  Please remember to sign up for MyChart if you have not done so, as this will be important to you in the future with finding out test results, communicating by private email, and scheduling acute appointments online when needed.  Please return in 1 year for your yearly visit, or sooner if needed

## 2014-08-16 NOTE — Progress Notes (Signed)
Subjective:    Patient ID: Cindy Higgins, female    DOB: [birth year], 68 y.o.   MRN: [id number]  HPI  Here for yearly f/u;  Overall doing ok;  Pt denies CP, worsening SOB, DOE, wheezing, orthopnea, PND, worsening LE edema, palpitations, dizziness or syncope.  Pt denies neurological change such as new headache, facial or extremity weakness.  Pt denies polydipsia, polyuria, or low sugar symptoms. Pt states overall good compliance with treatment and medications, good tolerability, and has been trying to follow lower cholesterol diet.  Pt denies worsening depressive symptoms, suicidal ideation or panic. No fever, night sweats, wt loss, loss of appetite, or other constitutional symptoms.  Pt states good ability with ADL's, has low fall risk, home safety reviewed and adequate, no other significant changes in hearing or vision, and only occasionally active with exercise with sliver sneakers. No current complaints.  Denies worsening depressive symptoms, suicidal ideation, or panic; has ongoing anxiety, not increased recently.  Wt Readings from Last 3 Encounters:  08/16/14 133 lb 4 oz (60.442 kg)  06/19/13 130 lb 8 oz (59.194 kg)  05/18/13 125 lb (56.7 kg)   Past Medical History  Diagnosis Date  . ALLERGIC RHINITIS 12/22/2007  . ANXIETY DEPRESSION 08/30/2007  . BLURRED VISION, INTERMITTENT 12/22/2007  . COLONIC POLYPS, HX OF 12/22/2007    01/18/2006  . GLUCOSE INTOLERANCE 07/08/2009  . Headache(784.0) 12/22/2007  . HYPERCHOLESTEROLEMIA 08/30/2007  . HYPERLIPIDEMIA 03/26/2008  . MITRAL VALVE PROLAPSE, HX OF 08/30/2007  . OSTEOPOROSIS 12/22/2007  . POSTMENOPAUSAL STATUS 08/30/2007  . SINUSITIS- ACUTE-NOS 02/06/2010  . Impaired glucose tolerance 05/09/2011  . Herpes zoster of eye 05/10/2011  . Anxiety    Past Surgical History  Procedure Laterality Date  . Abdominal hysterectomy    . Dilation and curettage of uterus  2007  . Oophorectomy    . Cosmetic surgery    . Colonoscopy    . Polypectomy      reports that she has never smoked. She has never used smokeless tobacco. She reports that she drinks about one ounce of alcohol per week. She reports that she does not use illicit drugs. family history includes Alcohol abuse in her father; Breast cancer in her maternal grandmother and sister; Cancer in her sister; Diabetes in her mother; Heart disease in her mother; Hyperlipidemia in her mother; Testicular cancer in her son. Allergies  Allergen Reactions  . Scallops [Shellfish Allergy] Nausea And Vomiting  . Codeine Itching and Rash  . Penicillins Itching   Current Outpatient Prescriptions on File Prior to Visit  Medication Sig Dispense Refill  . estrogens, conjugated, (PREMARIN) 0.3 MG tablet Take 0.3 mg by mouth daily.        No current facility-administered medications on file prior to visit.     Review of Systems Constitutional: Negative for increased diaphoresis, other activity, appetite or other siginficant weight change  HENT: Negative for worsening hearing loss, ear pain, facial swelling, mouth sores and neck stiffness.   Eyes: Negative for other worsening pain, redness or visual disturbance.  Respiratory: Negative for shortness of breath and wheezing.   Cardiovascular: Negative for chest pain and palpitations.  Gastrointestinal: Negative for diarrhea, blood in stool, abdominal distention or other pain Genitourinary: Negative for hematuria, flank pain or change in urine volume.  Musculoskeletal: Negative for myalgias or other joint complaints.  Skin: Negative for color change and wound.  Neurological: Negative for syncope and numbness. other than noted Hematological: Negative for adenopathy. or other swelling Psychiatric/Behavioral: Negative for hallucinations,  self-injury, decreased concentration or other worsening agitation.      Objective:   Physical Exam BP 120/74  Pulse 77  Temp(Src) 98 F (36.7 C) (Oral)  Ht 5\' 5"  (1.651 m)  Wt 133 lb 4 oz (60.442 kg)  BMI 22.17  kg/m2  SpO2 96% VS noted,  Constitutional: Pt is oriented to person, place, and time. Appears well-developed and well-nourished.  Head: Normocephalic and atraumatic.  Right Ear: External ear normal.  Left Ear: External ear normal.  Nose: Nose normal.  Mouth/Throat: Oropharynx is clear and moist.  Eyes: Conjunctivae and EOM are normal. Pupils are equal, round, and reactive to light.  Neck: Normal range of motion. Neck supple. No JVD present. No tracheal deviation present.  Cardiovascular: Normal rate, regular rhythm, normal heart sounds and intact distal pulses.   Pulmonary/Chest: Effort normal and breath sounds without rales or wheezing  Abdominal: Soft. Bowel sounds are normal. NT. No HSM  Musculoskeletal: Normal range of motion. Exhibits no edema.  Lymphadenopathy:  Has no cervical adenopathy.  Neurological: Pt is alert and oriented to person, place, and time. Pt has normal reflexes. No cranial nerve deficit. Motor grossly intact Skin: Skin is warm and dry. No rash noted.  Psychiatric:  Has normal mood and affect. Behavior is normal.     Assessment & Plan:

## 2014-08-16 NOTE — Assessment & Plan Note (Addendum)
ECG reviewed as per emr, stable overall by history and exam, recent data reviewed with pt, and pt to continue medical treatment as before,  to f/u any worsening symptoms or concerns Lab Results  Component Value Date   LDLCALC 147* 08/16/2014   Declines statin tx today

## 2014-08-16 NOTE — Progress Notes (Signed)
Pre visit review using our clinic review tool, if applicable. No additional management support is needed unless otherwise documented below in the visit note. 

## 2014-08-18 NOTE — Assessment & Plan Note (Signed)
stable overall by history and exam, recent data reviewed with pt, and pt to continue medical treatment as before,  to f/u any worsening symptoms or concerns Lab Results  Component Value Date   HGBA1C 5.7 08/16/2014

## 2014-08-18 NOTE — Assessment & Plan Note (Signed)
For repeat level, cont oral otc supplementation

## 2014-08-18 NOTE — Assessment & Plan Note (Signed)
stable overall by history and exam, recent data reviewed with pt, and pt to continue medical treatment as before,  to f/u any worsening symptoms or concerns Lab Results  Component Value Date   WBC 5.4 08/16/2014   HGB 13.4 08/16/2014   HCT 40.4 08/16/2014   PLT 303.0 08/16/2014   GLUCOSE 97 08/16/2014   CHOL 239* 08/16/2014   TRIG 96.0 08/16/2014   HDL 72.80 08/16/2014   LDLDIRECT 161.4 06/13/2013   LDLCALC 147* 08/16/2014   ALT 26 08/16/2014   AST 29 08/16/2014   NA 135 08/16/2014   K 4.6 08/16/2014   CL 103 08/16/2014   CREATININE 0.6 08/16/2014   BUN 11 08/16/2014   CO2 27 08/16/2014   TSH 5.05* 08/16/2014   HGBA1C 5.7 08/16/2014

## 2014-09-09 ENCOUNTER — Other Ambulatory Visit: Payer: Self-pay | Admitting: Internal Medicine

## 2014-10-30 ENCOUNTER — Other Ambulatory Visit: Payer: Self-pay | Admitting: Internal Medicine

## 2014-10-30 NOTE — Telephone Encounter (Signed)
Called the patient informed of PCP instructions 

## 2014-10-30 NOTE — Telephone Encounter (Signed)
Xanax too soon  Had 4 mo total refills done oct 2015

## 2015-01-24 ENCOUNTER — Other Ambulatory Visit: Payer: Self-pay | Admitting: Obstetrics and Gynecology

## 2015-01-27 LAB — CYTOLOGY - PAP

## 2015-04-02 ENCOUNTER — Encounter: Payer: Self-pay | Admitting: Internal Medicine

## 2015-05-05 ENCOUNTER — Other Ambulatory Visit: Payer: Self-pay

## 2015-06-02 ENCOUNTER — Other Ambulatory Visit (HOSPITAL_COMMUNITY): Payer: Self-pay | Admitting: Obstetrics and Gynecology

## 2015-06-02 DIAGNOSIS — Z1231 Encounter for screening mammogram for malignant neoplasm of breast: Secondary | ICD-10-CM

## 2015-06-23 ENCOUNTER — Ambulatory Visit (HOSPITAL_COMMUNITY): Payer: Medicare Other | Attending: Obstetrics and Gynecology

## 2015-07-02 ENCOUNTER — Other Ambulatory Visit: Payer: Self-pay | Admitting: Internal Medicine

## 2015-07-02 NOTE — Telephone Encounter (Signed)
Rx faxed to pharmacy  

## 2015-07-02 NOTE — Telephone Encounter (Signed)
Done hardcopy to Dahlia  

## 2015-08-21 ENCOUNTER — Encounter: Payer: Self-pay | Admitting: Internal Medicine

## 2015-08-21 ENCOUNTER — Ambulatory Visit (INDEPENDENT_AMBULATORY_CARE_PROVIDER_SITE_OTHER): Payer: Medicare Other | Admitting: Internal Medicine

## 2015-08-21 ENCOUNTER — Other Ambulatory Visit (INDEPENDENT_AMBULATORY_CARE_PROVIDER_SITE_OTHER): Payer: Medicare Other

## 2015-08-21 ENCOUNTER — Telehealth: Payer: Self-pay | Admitting: Internal Medicine

## 2015-08-21 VITALS — BP 116/74 | HR 82 | Temp 98.3°F | Ht 65.0 in | Wt 134.0 lb

## 2015-08-21 DIAGNOSIS — D7589 Other specified diseases of blood and blood-forming organs: Secondary | ICD-10-CM

## 2015-08-21 DIAGNOSIS — R635 Abnormal weight gain: Secondary | ICD-10-CM | POA: Insufficient documentation

## 2015-08-21 DIAGNOSIS — R7302 Impaired glucose tolerance (oral): Secondary | ICD-10-CM

## 2015-08-21 DIAGNOSIS — Z0000 Encounter for general adult medical examination without abnormal findings: Secondary | ICD-10-CM

## 2015-08-21 DIAGNOSIS — Z23 Encounter for immunization: Secondary | ICD-10-CM | POA: Diagnosis not present

## 2015-08-21 LAB — VITAMIN B12: VITAMIN B 12: 225 pg/mL (ref 211–911)

## 2015-08-21 LAB — HEPATIC FUNCTION PANEL
ALBUMIN: 4.2 g/dL (ref 3.5–5.2)
ALT: 28 U/L (ref 0–35)
AST: 20 U/L (ref 0–37)
Alkaline Phosphatase: 57 U/L (ref 39–117)
Bilirubin, Direct: 0.1 mg/dL (ref 0.0–0.3)
TOTAL PROTEIN: 7.5 g/dL (ref 6.0–8.3)
Total Bilirubin: 0.4 mg/dL (ref 0.2–1.2)

## 2015-08-21 LAB — CBC WITH DIFFERENTIAL/PLATELET
BASOS ABS: 0 10*3/uL (ref 0.0–0.1)
Basophils Relative: 0.6 % (ref 0.0–3.0)
EOS ABS: 0.1 10*3/uL (ref 0.0–0.7)
Eosinophils Relative: 1.8 % (ref 0.0–5.0)
HEMATOCRIT: 42.2 % (ref 36.0–46.0)
HEMOGLOBIN: 14.5 g/dL (ref 12.0–15.0)
LYMPHS PCT: 39.3 % (ref 12.0–46.0)
Lymphs Abs: 2 10*3/uL (ref 0.7–4.0)
MCHC: 34.4 g/dL (ref 30.0–36.0)
MCV: 101.9 fl — ABNORMAL HIGH (ref 78.0–100.0)
MONO ABS: 0.5 10*3/uL (ref 0.1–1.0)
Monocytes Relative: 10.4 % (ref 3.0–12.0)
Neutro Abs: 2.4 10*3/uL (ref 1.4–7.7)
Neutrophils Relative %: 47.9 % (ref 43.0–77.0)
Platelets: 327 10*3/uL (ref 150.0–400.0)
RBC: 4.14 Mil/uL (ref 3.87–5.11)
RDW: 13.6 % (ref 11.5–15.5)
WBC: 5 10*3/uL (ref 4.0–10.5)

## 2015-08-21 LAB — LIPID PANEL
CHOL/HDL RATIO: 4
CHOLESTEROL: 265 mg/dL — AB (ref 0–200)
HDL: 74.9 mg/dL (ref >39.00)
LDL Cholesterol: 168 mg/dL — ABNORMAL HIGH (ref 0–99)
NonHDL: 190.35
TRIGLYCERIDES: 111 mg/dL (ref 0.0–149.0)
VLDL: 22.2 mg/dL (ref 0.0–40.0)

## 2015-08-21 LAB — URINALYSIS, ROUTINE W REFLEX MICROSCOPIC
BILIRUBIN URINE: NEGATIVE
HGB URINE DIPSTICK: NEGATIVE
KETONES UR: NEGATIVE
LEUKOCYTES UA: NEGATIVE
Nitrite: NEGATIVE
RBC / HPF: NONE SEEN (ref 0–?)
Specific Gravity, Urine: <=1.005 — AB (ref 1.000–1.030)
Total Protein, Urine: NEGATIVE
UROBILINOGEN UA: 0.2 (ref 0.0–1.0)
Urine Glucose: NEGATIVE
pH: 6 (ref 5.0–8.0)

## 2015-08-21 LAB — BASIC METABOLIC PANEL
BUN: 10 mg/dL (ref 6–23)
CHLORIDE: 102 meq/L (ref 96–112)
CO2: 31 meq/L (ref 19–32)
CREATININE: 0.66 mg/dL (ref 0.40–1.20)
Calcium: 9.8 mg/dL (ref 8.4–10.5)
GFR: 94.95 mL/min (ref >60.00)
GLUCOSE: 96 mg/dL (ref 70–99)
Potassium: 4.8 mEq/L (ref 3.5–5.1)
Sodium: 138 mEq/L (ref 135–145)

## 2015-08-21 LAB — HEMOGLOBIN A1C: Hgb A1c MFr Bld: 5.4 % (ref 4.6–6.5)

## 2015-08-21 LAB — TSH: TSH: 4.1 u[IU]/mL (ref 0.35–4.50)

## 2015-08-21 MED ORDER — CITALOPRAM HYDROBROMIDE 40 MG PO TABS: 40.0000 mg | ORAL_TABLET | Freq: Every day | ORAL | Status: DC

## 2015-08-21 MED ORDER — ATORVASTATIN CALCIUM 20 MG PO TABS: 20.0000 mg | ORAL_TABLET | Freq: Every day | ORAL | Status: DC

## 2015-08-21 MED ORDER — ALPRAZOLAM 0.5 MG PO TABS: 0.5000 mg | ORAL_TABLET | Freq: Two times a day (BID) | ORAL | Status: DC | PRN

## 2015-08-21 MED ORDER — ASPIRIN EC 81 MG PO TBEC
81.0000 mg | DELAYED_RELEASE_TABLET | Freq: Every day | ORAL | Status: DC
Start: 2015-08-21 — End: 2019-01-09

## 2015-08-21 NOTE — Telephone Encounter (Signed)
See result note.  

## 2015-08-21 NOTE — Assessment & Plan Note (Signed)
etoilogy unclear, also for b12 check, doubt med or hepatic related

## 2015-08-21 NOTE — Addendum Note (Signed)
Addended by: Biagio Borg on: 08/21/2015 01:04 PM   Modules accepted: Miquel Dunn

## 2015-08-21 NOTE — Patient Instructions (Signed)
Your EKG was OK today  Please continue all other medications as before, and refills have been done if requested.  Please have the pharmacy call with any other refills you may need.  Please continue your efforts at being more active, low cholesterol diet, and weight control.  You are otherwise up to date with prevention measures today.  Please keep your appointments with your specialists as you may have planned  Please go to the LAB in the Basement (turn left off the elevator) for the tests to be done today  You will be contacted by phone if any changes need to be made immediately.  Otherwise, you will receive a letter about your results with an explanation, but please check with MyChart first.  Please remember to sign up for MyChart if you have not done so, as this will be important to you in the future with finding out test results, communicating by private email, and scheduling acute appointments online when needed.  Please return in 1 year for your yearly visit, or sooner if needed, with Lab testing done 3-5 days before  

## 2015-08-21 NOTE — Telephone Encounter (Signed)
Dahlia to also let pt know -   Unless pt cannot take asa, she should start asa 81 mg per day as general preventive measure for stroke and heart disease , as well as the lipitor

## 2015-08-21 NOTE — Assessment & Plan Note (Signed)
Mild, with recent slight elev tsh last yr, for fu lab, gym 3 times per wk , and lower calories

## 2015-08-21 NOTE — Assessment & Plan Note (Signed)
Asympt, for f/u a1c 

## 2015-08-21 NOTE — Progress Notes (Signed)
Pre visit review using our clinic review tool, if applicable. No additional management support is needed unless otherwise documented below in the visit note. 

## 2015-08-21 NOTE — Assessment & Plan Note (Signed)

## 2015-08-21 NOTE — Progress Notes (Signed)
Subjective:    Patient ID: Cindy Higgins, female    DOB: 03/07/1946, 69 y.o.   MRN: [id number]  HPI  Here for wellness and f/u;  Overall doing ok;  Pt denies Chest pain, worsening SOB, DOE, wheezing, orthopnea, PND, worsening LE edema, palpitations, dizziness or syncope.  Pt denies neurological change such as new headache, facial or extremity weakness.  Pt denies polydipsia, polyuria, or low sugar symptoms. Pt states overall good compliance with treatment and medications, good tolerability, and has been trying to follow appropriate diet.  Pt denies worsening depressive symptoms, suicidal ideation or panic. No fever, night sweats, wt loss, loss of appetite, or other constitutional symptoms.  Pt states good ability with ADL's, has low fall risk, home safety reviewed and adequate, no other significant changes in hearing or vision, and only occasionally active with exercise, with gym twice per wk.  .Due for fu mammo oct 23.  Gained 6 lbs since apr 2016 for unclear reasons, wondering about thyroid.  Has been somewhat less active since retired 3 yrs ago. Wt Readings from Last 3 Encounters:  08/21/15 134 lb (60.782 kg)  08/16/14 133 lb 4 oz (60.442 kg)  06/19/13 130 lb 8 oz (59.194 kg)  \ Past Medical History  Diagnosis Date  . ALLERGIC RHINITIS 12/22/2007  . ANXIETY DEPRESSION 08/30/2007  . BLURRED VISION, INTERMITTENT 12/22/2007  . COLONIC POLYPS, HX OF 12/22/2007    01/18/2006  . GLUCOSE INTOLERANCE 07/08/2009  . Headache(784.0) 12/22/2007  . HYPERCHOLESTEROLEMIA 08/30/2007  . HYPERLIPIDEMIA 03/26/2008  . MITRAL VALVE PROLAPSE, HX OF 08/30/2007  . OSTEOPOROSIS 12/22/2007  . POSTMENOPAUSAL STATUS 08/30/2007  . SINUSITIS- ACUTE-NOS 02/06/2010  . Impaired glucose tolerance 05/09/2011  . Herpes zoster of eye 05/10/2011  . Anxiety    Past Surgical History  Procedure Laterality Date  . Abdominal hysterectomy    . Dilation and curettage of uterus  2007  . Oophorectomy    . Cosmetic surgery    .  Colonoscopy    . Polypectomy      reports that she has never smoked. She has never used smokeless tobacco. She reports that she drinks about 1.0 oz of alcohol per week. She reports that she does not use illicit drugs. family history includes Alcohol abuse in her father; Breast cancer in her maternal grandmother and sister; Cancer in her sister; Diabetes in her mother; Heart disease in her mother; Hyperlipidemia in her mother; Testicular cancer in her son. Allergies  Allergen Reactions  . Scallops [Shellfish Allergy] Nausea And Vomiting  . Codeine Itching and Rash  . Penicillins Itching   Current Outpatient Prescriptions on File Prior to Visit  Medication Sig Dispense Refill  . ALPRAZolam (XANAX) 0.5 MG tablet TAKE ONE TABLET BY MOUTH TWICE DAILY AS NEEDED 60 tablet 0  . estrogens, conjugated, (PREMARIN) 0.3 MG tablet Take 0.3 mg by mouth daily.      No current facility-administered medications on file prior to visit.    Review of Systems Constitutional: Negative for increased diaphoresis, other activity, appetite or siginficant weight change other than noted HENT: Negative for worsening hearing loss, ear pain, facial swelling, mouth sores and neck stiffness.   Eyes: Negative for other worsening pain, redness or visual disturbance.  Respiratory: Negative for shortness of breath and wheezing  Cardiovascular: Negative for chest pain and palpitations.  Gastrointestinal: Negative for diarrhea, blood in stool, abdominal distention or other pain Genitourinary: Negative for hematuria, flank pain or change in urine volume.  Musculoskeletal: Negative for myalgias or  other joint complaints.  Skin: Negative for color change and wound or drainage.  Neurological: Negative for syncope and numbness. other than noted Hematological: Negative for adenopathy. or other swelling Psychiatric/Behavioral: Negative for hallucinations, SI, self-injury, decreased concentration or other worsening agitation.        Objective:   Physical Exam BP 116/74 mmHg  Pulse 82  Temp(Src) 98.3 F (36.8 C) (Oral)  Ht 5\' 5"  (1.651 m)  Wt 134 lb (60.782 kg)  BMI 22.30 kg/m2  SpO2 97% VS noted,  Constitutional: Pt is oriented to person, place, and time. Appears well-developed and well-nourished, in no significant distress Head: Normocephalic and atraumatic.  Right Ear: External ear normal.  Left Ear: External ear normal.  Nose: Nose normal.  Mouth/Throat: Oropharynx is clear and moist.  Eyes: Conjunctivae and EOM are normal. Pupils are equal, round, and reactive to light.  Neck: Normal range of motion. Neck supple. No JVD present. No tracheal deviation present or significant neck LA or mass Cardiovascular: Normal rate, regular rhythm, normal heart sounds and intact distal pulses.   Pulmonary/Chest: Effort normal and breath sounds without rales or wheezing  Abdominal: Soft. Bowel sounds are normal. NT. No HSM  Musculoskeletal: Normal range of motion. Exhibits no edema.  Lymphadenopathy:  Has no cervical adenopathy.  Neurological: Pt is alert and oriented to person, place, and time. Pt has normal reflexes. No cranial nerve deficit. Motor grossly intact Skin: Skin is warm and dry. No rash noted.  Psychiatric:  Has normal mood and affect. Behavior is normal.         Assessment & Plan:

## 2015-08-21 NOTE — Addendum Note (Signed)
Addended by: Biagio Borg on: 08/21/2015 09:46 AM   Modules accepted: Orders, SmartSet

## 2015-08-22 LAB — HEPATITIS C ANTIBODY: HCV AB: NEGATIVE

## 2015-09-02 ENCOUNTER — Ambulatory Visit: Payer: Self-pay

## 2015-10-15 ENCOUNTER — Ambulatory Visit: Payer: Self-pay

## 2015-12-09 DIAGNOSIS — H40013 Open angle with borderline findings, low risk, bilateral: Secondary | ICD-10-CM | POA: Diagnosis not present

## 2015-12-10 DIAGNOSIS — R69 Illness, unspecified: Secondary | ICD-10-CM | POA: Diagnosis not present

## 2015-12-16 DIAGNOSIS — H40012 Open angle with borderline findings, low risk, left eye: Secondary | ICD-10-CM | POA: Diagnosis not present

## 2016-01-08 ENCOUNTER — Ambulatory Visit
Admission: RE | Admit: 2016-01-08 | Discharge: 2016-01-08 | Disposition: A | Discharge status: Home / Self Care | Payer: Medicare HMO | Source: Ambulatory Visit | Attending: Obstetrics and Gynecology | Admitting: Obstetrics and Gynecology

## 2016-01-08 DIAGNOSIS — Z1231 Encounter for screening mammogram for malignant neoplasm of breast: Secondary | ICD-10-CM | POA: Diagnosis not present

## 2016-02-05 DIAGNOSIS — Z01419 Encounter for gynecological examination (general) (routine) without abnormal findings: Secondary | ICD-10-CM | POA: Diagnosis not present

## 2016-02-06 ENCOUNTER — Other Ambulatory Visit: Payer: Self-pay | Admitting: Obstetrics and Gynecology

## 2016-02-06 DIAGNOSIS — M8580 Other specified disorders of bone density and structure, unspecified site: Secondary | ICD-10-CM

## 2016-04-08 ENCOUNTER — Ambulatory Visit
Admission: RE | Admit: 2016-04-08 | Discharge: 2016-04-08 | Disposition: A | Discharge status: Home / Self Care | Payer: Medicare HMO | Source: Ambulatory Visit | Attending: Obstetrics and Gynecology | Admitting: Obstetrics and Gynecology

## 2016-04-08 DIAGNOSIS — M8580 Other specified disorders of bone density and structure, unspecified site: Secondary | ICD-10-CM

## 2016-04-08 DIAGNOSIS — Z780 Asymptomatic menopausal state: Secondary | ICD-10-CM | POA: Diagnosis not present

## 2016-04-08 DIAGNOSIS — M85851 Other specified disorders of bone density and structure, right thigh: Secondary | ICD-10-CM | POA: Diagnosis not present

## 2016-04-15 ENCOUNTER — Other Ambulatory Visit: Payer: Self-pay | Admitting: Internal Medicine

## 2016-04-15 NOTE — Telephone Encounter (Signed)
MD out of office pls advise.../lmb 

## 2016-04-16 NOTE — Telephone Encounter (Signed)
Faxed script to [facility]...Johny Chess

## 2016-06-22 ENCOUNTER — Other Ambulatory Visit: Payer: Self-pay | Admitting: Family

## 2016-06-22 DIAGNOSIS — H40012 Open angle with borderline findings, low risk, left eye: Secondary | ICD-10-CM | POA: Diagnosis not present

## 2016-06-22 NOTE — Telephone Encounter (Signed)
Done hardcopy to Corinne  

## 2016-06-22 NOTE — Telephone Encounter (Signed)
Medication refill sent to pharmacy  

## 2016-07-28 DIAGNOSIS — H43812 Vitreous degeneration, left eye: Secondary | ICD-10-CM | POA: Diagnosis not present

## 2016-08-11 DIAGNOSIS — H43812 Vitreous degeneration, left eye: Secondary | ICD-10-CM | POA: Diagnosis not present

## 2016-08-17 DIAGNOSIS — H40022 Open angle with borderline findings, high risk, left eye: Secondary | ICD-10-CM | POA: Diagnosis not present

## 2016-08-24 ENCOUNTER — Encounter: Payer: Self-pay | Admitting: Internal Medicine

## 2016-09-08 ENCOUNTER — Encounter: Payer: Self-pay | Admitting: Internal Medicine

## 2016-10-14 ENCOUNTER — Encounter: Payer: Self-pay | Admitting: Internal Medicine

## 2016-10-14 ENCOUNTER — Ambulatory Visit (INDEPENDENT_AMBULATORY_CARE_PROVIDER_SITE_OTHER): Payer: Medicare HMO | Admitting: Internal Medicine

## 2016-10-14 ENCOUNTER — Other Ambulatory Visit (INDEPENDENT_AMBULATORY_CARE_PROVIDER_SITE_OTHER): Payer: Medicare HMO

## 2016-10-14 VITALS — BP 128/78 | HR 94 | Temp 98.0°F | Resp 20 | Wt 132.0 lb

## 2016-10-14 DIAGNOSIS — E559 Vitamin D deficiency, unspecified: Secondary | ICD-10-CM

## 2016-10-14 DIAGNOSIS — Z0001 Encounter for general adult medical examination with abnormal findings: Secondary | ICD-10-CM | POA: Diagnosis not present

## 2016-10-14 DIAGNOSIS — E538 Deficiency of other specified B group vitamins: Secondary | ICD-10-CM | POA: Diagnosis not present

## 2016-10-14 DIAGNOSIS — E7800 Pure hypercholesterolemia, unspecified: Secondary | ICD-10-CM | POA: Diagnosis not present

## 2016-10-14 DIAGNOSIS — R7302 Impaired glucose tolerance (oral): Secondary | ICD-10-CM | POA: Diagnosis not present

## 2016-10-14 LAB — CBC WITH DIFFERENTIAL/PLATELET
BASOS PCT: 0.3 % (ref 0.0–3.0)
Basophils Absolute: 0 10*3/uL (ref 0.0–0.1)
EOS ABS: 0.2 10*3/uL (ref 0.0–0.7)
Eosinophils Relative: 2.2 % (ref 0.0–5.0)
HCT: 41.4 % (ref 36.0–46.0)
Hemoglobin: 14.4 g/dL (ref 12.0–15.0)
LYMPHS ABS: 2.4 10*3/uL (ref 0.7–4.0)
Lymphocytes Relative: 33 % (ref 12.0–46.0)
MCHC: 34.7 g/dL (ref 30.0–36.0)
MCV: 100.7 fl — ABNORMAL HIGH (ref 78.0–100.0)
MONO ABS: 0.7 10*3/uL (ref 0.1–1.0)
Monocytes Relative: 9.2 % (ref 3.0–12.0)
NEUTROS ABS: 4 10*3/uL (ref 1.4–7.7)
Neutrophils Relative %: 55.3 % (ref 43.0–77.0)
PLATELETS: 321 10*3/uL (ref 150.0–400.0)
RBC: 4.11 Mil/uL (ref 3.87–5.11)
RDW: 12.9 % (ref 11.5–15.5)
WBC: 7.2 10*3/uL (ref 4.0–10.5)

## 2016-10-14 LAB — LIPID PANEL
CHOL/HDL RATIO: 3
Cholesterol: 251 mg/dL — ABNORMAL HIGH (ref 0–200)
HDL: 84.8 mg/dL (ref >39.00)
LDL Cholesterol: 143 mg/dL — ABNORMAL HIGH (ref 0–99)
NonHDL: 166.34
TRIGLYCERIDES: 116 mg/dL (ref 0.0–149.0)
VLDL: 23.2 mg/dL (ref 0.0–40.0)

## 2016-10-14 LAB — URINALYSIS, ROUTINE W REFLEX MICROSCOPIC
Bilirubin Urine: NEGATIVE
Hgb urine dipstick: NEGATIVE
Ketones, ur: NEGATIVE
Leukocytes, UA: NEGATIVE
Nitrite: NEGATIVE
RBC / HPF: NONE SEEN (ref 0–?)
SPECIFIC GRAVITY, URINE: 1.02 (ref 1.000–1.030)
TOTAL PROTEIN, URINE-UPE24: NEGATIVE
URINE GLUCOSE: NEGATIVE
Urobilinogen, UA: 0.2 (ref 0.0–1.0)
WBC, UA: NONE SEEN (ref 0–?)
pH: 6 (ref 5.0–8.0)

## 2016-10-14 LAB — HEPATIC FUNCTION PANEL
ALK PHOS: 55 U/L (ref 39–117)
ALT: 18 U/L (ref 0–35)
AST: 17 U/L (ref 0–37)
Albumin: 4.5 g/dL (ref 3.5–5.2)
BILIRUBIN DIRECT: 0.1 mg/dL (ref 0.0–0.3)
BILIRUBIN TOTAL: 0.4 mg/dL (ref 0.2–1.2)
Total Protein: 7.9 g/dL (ref 6.0–8.3)

## 2016-10-14 LAB — VITAMIN D 25 HYDROXY (VIT D DEFICIENCY, FRACTURES): VITD: 25.77 ng/mL — ABNORMAL LOW (ref 30.00–100.00)

## 2016-10-14 LAB — BASIC METABOLIC PANEL
BUN: 15 mg/dL (ref 6–23)
CHLORIDE: 98 meq/L (ref 96–112)
CO2: 28 mEq/L (ref 19–32)
Calcium: 9.6 mg/dL (ref 8.4–10.5)
Creatinine, Ser: 0.69 mg/dL (ref 0.40–1.20)
GFR: 89.89 mL/min (ref >60.00)
Glucose, Bld: 106 mg/dL — ABNORMAL HIGH (ref 70–99)
POTASSIUM: 4 meq/L (ref 3.5–5.1)
Sodium: 137 mEq/L (ref 135–145)

## 2016-10-14 LAB — HEMOGLOBIN A1C: Hgb A1c MFr Bld: 5.7 % (ref 4.6–6.5)

## 2016-10-14 LAB — VITAMIN B12: VITAMIN B 12: 168 pg/mL — AB (ref 211–911)

## 2016-10-14 LAB — TSH: TSH: 5.85 u[IU]/mL — AB (ref 0.35–4.50)

## 2016-10-14 MED ORDER — EZETIMIBE 10 MG PO TABS: 10.0000 mg | ORAL_TABLET | Freq: Every day | ORAL | 3 refills | Status: DC

## 2016-10-14 NOTE — Patient Instructions (Addendum)
You had the flu shot today  Please take all new medication as prescribed - the zetia 10 mg per day  OK to take B12 OTC - 1 per day  Please continue all other medications as before, and refills have been done if requested.  Please have the pharmacy call with any other refills you may need.  Please continue your efforts at being more active, low cholesterol diet, and weight control.  You are otherwise up to date with prevention measures today.  Please keep your appointments with your specialists as you may have planned  Please go to the LAB in the Basement (turn left off the elevator) for the tests to be done today  You will be contacted by phone if any changes need to be made immediately.  Otherwise, you will receive a letter about your results with an explanation, but please check with MyChart first.  Please remember to sign up for MyChart if you have not done so, as this will be important to you in the future with finding out test results, communicating by private email, and scheduling acute appointments online when needed.  Please return in 1 year for your yearly visit, or sooner if needed, with Lab testing done 3-5 days before

## 2016-10-14 NOTE — Progress Notes (Signed)
Pre visit review using our clinic review tool, if applicable. No additional management support is needed unless otherwise documented below in the visit note. 

## 2016-10-14 NOTE — Progress Notes (Signed)
Subjective:    Patient ID: Cindy Higgins, female    DOB: [birth year], 70 y.o.   MRN: [id number]  HPI  Here for wellness and f/u;  Overall doing ok;  Pt denies Chest pain, worsening SOB, DOE, wheezing, orthopnea, PND, worsening LE edema, palpitations, dizziness or syncope.  Pt denies neurological change such as new headache, facial or extremity weakness.  Pt denies polydipsia, polyuria, or low sugar symptoms. Pt states overall good compliance with treatment and medications, good tolerability, and has been trying to follow appropriate diet.  Pt denies worsening depressive symptoms, suicidal ideation or panic. No fever, night sweats, wt loss, loss of appetite, or other constitutional symptoms.  Pt states good ability with ADL's, has low fall risk, home safety reviewed and adequate, no other significant changes in hearing or vision, and only occasionally active with exercise.  Trying to follow lower chol diet.  Due for f/u Vit D, b12, sugar, lipids  No other new history Wt Readings from Last 3 Encounters:  10/14/16 132 lb (59.9 kg)  08/21/15 134 lb (60.8 kg)  08/16/14 133 lb 4 oz (60.4 kg)   Past Medical History:  Diagnosis Date  . ALLERGIC RHINITIS 12/22/2007  . Anxiety   . ANXIETY DEPRESSION 08/30/2007  . BLURRED VISION, INTERMITTENT 12/22/2007  . COLONIC POLYPS, HX OF 12/22/2007   01/18/2006  . GLUCOSE INTOLERANCE 07/08/2009  . Headache(784.0) 12/22/2007  . Herpes zoster of eye 05/10/2011  . HYPERCHOLESTEROLEMIA 08/30/2007  . HYPERLIPIDEMIA 03/26/2008  . Impaired glucose tolerance 05/09/2011  . MITRAL VALVE PROLAPSE, HX OF 08/30/2007  . OSTEOPOROSIS 12/22/2007  . POSTMENOPAUSAL STATUS 08/30/2007  . SINUSITIS- ACUTE-NOS 02/06/2010   Past Surgical History:  Procedure Laterality Date  . ABDOMINAL HYSTERECTOMY    . COLONOSCOPY    . COSMETIC SURGERY    . DILATION AND CURETTAGE OF UTERUS  2007  . OOPHORECTOMY    . POLYPECTOMY      reports that she has never smoked. She has never used smokeless  tobacco. She reports that she drinks about 1.0 oz of alcohol per week . She reports that she does not use drugs. family history includes Alcohol abuse in her father; Breast cancer in her maternal grandmother and sister; Cancer in her sister; Diabetes in her mother; Heart disease in her mother; Hyperlipidemia in her mother; Testicular cancer in her son. Allergies  Allergen Reactions  . Scallops [Shellfish Allergy] Nausea And Vomiting  . Codeine Itching and Rash  . Penicillins Itching   Current Outpatient Prescriptions on File Prior to Visit  Medication Sig Dispense Refill  . ALPRAZolam (XANAX) 0.5 MG tablet TAKE ONE TABLET BY MOUTH TWICE DAILY AS NEEDED 60 tablet 1  . aspirin EC 81 MG tablet Take 1 tablet (81 mg total) by mouth daily. 90 tablet 11  . citalopram (CELEXA) 40 MG tablet Take 1 tablet (40 mg total) by mouth daily. 90 tablet 3  . estrogens, conjugated, (PREMARIN) 0.3 MG tablet Take 0.3 mg by mouth daily.     Marland Kitchen atorvastatin (LIPITOR) 20 MG tablet Take 1 tablet (20 mg total) by mouth daily. 90 tablet 3   No current facility-administered medications on file prior to visit.   Not taking the asa or lipitor  Has some low mood related to friction with daughter in law but now better after counseling. Past Medical History:  Diagnosis Date  . ALLERGIC RHINITIS 12/22/2007  . Anxiety   . ANXIETY DEPRESSION 08/30/2007  . BLURRED VISION, INTERMITTENT 12/22/2007  . COLONIC POLYPS, HX  OF 12/22/2007   01/18/2006  . GLUCOSE INTOLERANCE 07/08/2009  . Headache(784.0) 12/22/2007  . Herpes zoster of eye 05/10/2011  . HYPERCHOLESTEROLEMIA 08/30/2007  . HYPERLIPIDEMIA 03/26/2008  . Impaired glucose tolerance 05/09/2011  . MITRAL VALVE PROLAPSE, HX OF 08/30/2007  . OSTEOPOROSIS 12/22/2007  . POSTMENOPAUSAL STATUS 08/30/2007  . SINUSITIS- ACUTE-NOS 02/06/2010   Past Surgical History:  Procedure Laterality Date  . ABDOMINAL HYSTERECTOMY    . COLONOSCOPY    . COSMETIC SURGERY    . DILATION AND CURETTAGE  OF UTERUS  2007  . OOPHORECTOMY    . POLYPECTOMY      reports that she has never smoked. She has never used smokeless tobacco. She reports that she drinks about 1.0 oz of alcohol per week . She reports that she does not use drugs. family history includes Alcohol abuse in her father; Breast cancer in her maternal grandmother and sister; Cancer in her sister; Diabetes in her mother; Heart disease in her mother; Hyperlipidemia in her mother; Testicular cancer in her son. Allergies  Allergen Reactions  . Scallops [Shellfish Allergy] Nausea And Vomiting  . Codeine Itching and Rash  . Penicillins Itching   Current Outpatient Prescriptions on File Prior to Visit  Medication Sig Dispense Refill  . ALPRAZolam (XANAX) 0.5 MG tablet TAKE ONE TABLET BY MOUTH TWICE DAILY AS NEEDED 60 tablet 1  . aspirin EC 81 MG tablet Take 1 tablet (81 mg total) by mouth daily. 90 tablet 11  . citalopram (CELEXA) 40 MG tablet Take 1 tablet (40 mg total) by mouth daily. 90 tablet 3  . estrogens, conjugated, (PREMARIN) 0.3 MG tablet Take 0.3 mg by mouth daily.     Marland Kitchen atorvastatin (LIPITOR) 20 MG tablet Take 1 tablet (20 mg total) by mouth daily. 90 tablet 3   No current facility-administered medications on file prior to visit.     Review of Systems  Constitutional: Negative for increased diaphoresis, or other activity, appetite or siginficant weight change other than noted HENT: Negative for worsening hearing loss, ear pain, facial swelling, mouth sores and neck stiffness.   Eyes: Negative for other worsening pain, redness or visual disturbance.  Respiratory: Negative for choking or stridor Cardiovascular: Negative for other chest pain and palpitations.  Gastrointestinal: Negative for worsening diarrhea, blood in stool, or abdominal distention Genitourinary: Negative for hematuria, flank pain or change in urine volume.  Musculoskeletal: Negative for myalgias or other joint complaints.  Skin: Negative for other color  change and wound or drainage.  Neurological: Negative for syncope and numbness. other than noted Hematological: Negative for adenopathy. or other swelling Psychiatric/Behavioral: Negative for hallucinations, SI, self-injury, decreased concentration or other worsening agitation.  All other system neg per pt     Objective:   Physical Exam BP 128/78   Pulse 94   Temp 98 F (36.7 C) (Oral)   Resp 20   Wt 132 lb (59.9 kg)   SpO2 97%   BMI 21.97 kg/m  VS noted,  Constitutional: Pt is oriented to person, place, and time. Appears well-developed and well-nourished, in no significant distress Head: Normocephalic and atraumatic  Eyes: Conjunctivae and EOM are normal. Pupils are equal, round, and reactive to light Right Ear: External ear normal.  Left Ear: External ear normal Nose: Nose normal.  Mouth/Throat: Oropharynx is clear and moist  Neck: Normal range of motion. Neck supple. No JVD present. No tracheal deviation present or significant neck LA or mass Cardiovascular: Normal rate, regular rhythm, normal heart sounds and intact distal  pulses.   Pulmonary/Chest: Effort normal and breath sounds without rales or wheezing  Abdominal: Soft. Bowel sounds are normal. NT. No HSM  Musculoskeletal: Normal range of motion. Exhibits no edema Lymphadenopathy: Has no cervical adenopathy.  Neurological: Pt is alert and oriented to person, place, and time. Pt has normal reflexes. No cranial nerve deficit. Motor grossly intact Skin: Skin is warm and dry. No rash noted or new ulcers Psychiatric:  Has mild nervous mood and affect. Behavior is normal.  No other new exam findings    Assessment & Plan:

## 2016-10-15 ENCOUNTER — Encounter: Payer: Self-pay | Admitting: Internal Medicine

## 2016-10-15 ENCOUNTER — Other Ambulatory Visit: Payer: Self-pay | Admitting: Internal Medicine

## 2016-10-15 DIAGNOSIS — E559 Vitamin D deficiency, unspecified: Secondary | ICD-10-CM

## 2016-10-15 DIAGNOSIS — E785 Hyperlipidemia, unspecified: Secondary | ICD-10-CM

## 2016-10-15 DIAGNOSIS — E538 Deficiency of other specified B group vitamins: Secondary | ICD-10-CM

## 2016-10-15 DIAGNOSIS — E039 Hypothyroidism, unspecified: Secondary | ICD-10-CM

## 2016-10-15 MED ORDER — LEVOTHYROXINE SODIUM 25 MCG PO TABS: 25.0000 ug | ORAL_TABLET | Freq: Every day | ORAL | 3 refills | Status: DC

## 2016-10-15 NOTE — Telephone Encounter (Signed)
The test results show that your current treatment is OK, except there are several things to mention  There is a slightly elevated TSH which has happened before, and you likely do have very mild low thyroid condition, which is not serious, and can be treated with low dose of thyroid medication.  This will be sent to your pharmacy and you should be notified by the office about this as well.  Please plan to return to the LAB ONLY in 3 months for a repeat blood testing.       Also there is evidence for low Vit D, and well as low Vit B12 as we suspected.  Please take OTC  Vit D 2000 units per day, as well as any Vit B12 supplement (one per day).  These levels can also be rechecked at your next lab visit.      There is also the elevated LDL cholesterol, but we have discussed the zetia 10 mg per day which has already been sent to your pharmacy, and this can be rechecked in 3 mo as well.  Corinne to inform pt, I will do rx for levothyroxine, pt should get b12 and Vit D otc, and start the zetia as planned;  I will place orders for 3 month labs

## 2016-10-16 NOTE — Assessment & Plan Note (Signed)
decliens statin, for zetia 10 qd,  to f/u any worsening symptoms or concerns

## 2016-10-16 NOTE — Assessment & Plan Note (Signed)

## 2016-10-16 NOTE — Assessment & Plan Note (Signed)
For f/u level

## 2016-10-16 NOTE — Assessment & Plan Note (Signed)
Also for a1c,  to f/u any worsening symptoms or concerns  

## 2016-10-16 NOTE — Assessment & Plan Note (Signed)
For f/u level,  to f/u any worsening symptoms or concerns  

## 2016-10-18 ENCOUNTER — Other Ambulatory Visit: Payer: Self-pay | Admitting: Internal Medicine

## 2016-10-18 ENCOUNTER — Other Ambulatory Visit: Payer: Self-pay | Admitting: Family

## 2016-10-19 NOTE — Telephone Encounter (Signed)
faxed

## 2016-10-19 NOTE — Telephone Encounter (Signed)
Done hardcopy to Corinne  

## 2016-12-10 ENCOUNTER — Emergency Department (HOSPITAL_COMMUNITY): Payer: Medicare HMO

## 2016-12-10 ENCOUNTER — Emergency Department (HOSPITAL_COMMUNITY)
Admission: EM | Admit: 2016-12-10 | Discharge: 2016-12-10 | Disposition: A | Discharge status: Home / Self Care | Payer: Medicare HMO | Attending: Emergency Medicine | Admitting: Emergency Medicine

## 2016-12-10 ENCOUNTER — Encounter (HOSPITAL_COMMUNITY): Payer: Self-pay | Admitting: Emergency Medicine

## 2016-12-10 DIAGNOSIS — J069 Acute upper respiratory infection, unspecified: Secondary | ICD-10-CM | POA: Insufficient documentation

## 2016-12-10 DIAGNOSIS — R0602 Shortness of breath: Secondary | ICD-10-CM | POA: Diagnosis not present

## 2016-12-10 DIAGNOSIS — Z7982 Long term (current) use of aspirin: Secondary | ICD-10-CM | POA: Diagnosis not present

## 2016-12-10 DIAGNOSIS — Z79899 Other long term (current) drug therapy: Secondary | ICD-10-CM | POA: Insufficient documentation

## 2016-12-10 DIAGNOSIS — R05 Cough: Secondary | ICD-10-CM | POA: Diagnosis not present

## 2016-12-10 LAB — INFLUENZA PANEL BY PCR (TYPE A & B)
Influenza A By PCR: NEGATIVE
Influenza B By PCR: NEGATIVE

## 2016-12-10 MED ORDER — BENZONATATE 100 MG PO CAPS: 100.0000 mg | ORAL_CAPSULE | Freq: Three times a day (TID) | ORAL | 0 refills | Status: DC

## 2016-12-10 MED ORDER — ACETAMINOPHEN 325 MG PO TABS
650.0000 mg | ORAL_TABLET | Freq: Once | ORAL | Status: AC
  Administered 2016-12-10: 650 mg via ORAL
  Filled 2016-12-10: qty 2

## 2016-12-10 NOTE — ED Triage Notes (Signed)
Patient just came back from [place]. Patient has been having flu like symptoms, a cough, nasal congestion, and a headache since Sunday. Patient states husband was sick first.

## 2016-12-10 NOTE — ED Notes (Signed)
Patient stated symptoms started last Sunday. States she just came back from [place], [place]. However, husband recently gotten over the flu so doesn't think it was related to her trip. States she has been having sinus congestion, cough, headaches. States " I have been taking OTC medications however, symptoms are still nagging and didn't think I could wait until today to go to PCP or urgent care. I just want to get some sleep". Denies fever, chills.

## 2016-12-10 NOTE — ED Provider Notes (Signed)
[facility] DEPT Provider Note   CSN: [id number] Arrival date & time: 12/10/16  0621     History   Chief Complaint Chief Complaint  Patient presents with  . Influenza  . Cough  . Headache  . Nasal Congestion    HPI Cindy Higgins is a 71 y.o. female.  71 year old female presents with 5 days of cough and congestion. Recently returned from [place] as well as her husband had similar symptoms. She notes a sore throat without fever or chills. No vomiting or diarrhea. No urinary symptoms. Has been using over-the-counter medications without relief. Denies any neck pain but has had some mild cephalgia. Denies any rashes. No photophobia. Nothing makes her symptoms better.      Past Medical History:  Diagnosis Date  . ALLERGIC RHINITIS 12/22/2007  . Anxiety   . ANXIETY DEPRESSION 08/30/2007  . BLURRED VISION, INTERMITTENT 12/22/2007  . COLONIC POLYPS, HX OF 12/22/2007   01/18/2006  . GLUCOSE INTOLERANCE 07/08/2009  . Headache(784.0) 12/22/2007  . Herpes zoster of eye 05/10/2011  . HYPERCHOLESTEROLEMIA 08/30/2007  . HYPERLIPIDEMIA 03/26/2008  . Impaired glucose tolerance 05/09/2011  . MITRAL VALVE PROLAPSE, HX OF 08/30/2007  . OSTEOPOROSIS 12/22/2007  . POSTMENOPAUSAL STATUS 08/30/2007  . SINUSITIS- ACUTE-NOS 02/06/2010    Patient Active Problem List   Diagnosis Date Noted  . B12 deficiency 10/14/2016  . Weight gain 08/21/2015  . Macrocytosis 08/21/2015  . Vitamin D deficiency 05/10/2012  . Herpes zoster of eye 05/10/2011  . Impaired glucose tolerance 05/09/2011  . Encounter for well adult exam with abnormal findings 05/09/2011  . BLURRED VISION, INTERMITTENT 12/22/2007  . ALLERGIC RHINITIS 12/22/2007  . OSTEOPOROSIS 12/22/2007  . Headache(784.0) 12/22/2007  . COLONIC POLYPS, HX OF 12/22/2007  . HYPERCHOLESTEROLEMIA 08/30/2007  . ANXIETY DEPRESSION 08/30/2007  . POSTMENOPAUSAL STATUS 08/30/2007  . MITRAL VALVE PROLAPSE, HX OF 08/30/2007    Past Surgical History:  Procedure  Laterality Date  . ABDOMINAL HYSTERECTOMY    . COLONOSCOPY    . COSMETIC SURGERY    . DILATION AND CURETTAGE OF UTERUS  2007  . OOPHORECTOMY    . POLYPECTOMY      OB History    No data available       Home Medications    Prior to Admission medications   Medication Sig Start Date End Date Taking? Authorizing Provider  ALPRAZolam Duanne Moron) 0.5 MG tablet TAKE ONE TABLET BY MOUTH TWICE DAILY AS NEEDED 10/19/16   Biagio Borg, MD  aspirin EC 81 MG tablet Take 1 tablet (81 mg total) by mouth daily. 08/21/15   Biagio Borg, MD  atorvastatin (LIPITOR) 20 MG tablet Take 1 tablet (20 mg total) by mouth daily. 08/21/15 08/20/16  Biagio Borg, MD  citalopram (CELEXA) 40 MG tablet TAKE ONE TABLET BY MOUTH ONCE DAILY 10/19/16   Biagio Borg, MD  estrogens, conjugated, (PREMARIN) 0.3 MG tablet Take 0.3 mg by mouth daily.     Historical Provider, MD  ezetimibe (ZETIA) 10 MG tablet Take 1 tablet (10 mg total) by mouth daily. 10/14/16   Biagio Borg, MD  levothyroxine (SYNTHROID, LEVOTHROID) 25 MCG tablet Take 1 tablet (25 mcg total) by mouth daily before breakfast. 10/15/16   Biagio Borg, MD    Family History Family History  Problem Relation Age of Onset  . Heart disease Mother   . Diabetes Mother   . Hyperlipidemia Mother   . Alcohol abuse Father   . Breast cancer Sister   . Cancer Sister   .  Testicular cancer Son   . Breast cancer Maternal Grandmother     Social History Social History  Substance Use Topics  . Smoking status: Never Smoker  . Smokeless tobacco: Never Used  . Alcohol use 1.0 oz/week    2 drink(s) per week     Comment: daily     Allergies   Sulfa antibiotics; Scallops [shellfish allergy]; Codeine; and Penicillins   Review of Systems Review of Systems  All other systems reviewed and are negative.    Physical Exam Updated Vital Signs BP 144/65 (BP Location: Left Arm)   Pulse 91   Temp 98.6 F (37 C) (Oral)   Resp 16   Ht 5\' 4"  (1.626 m)   Wt 58.5 kg    SpO2 100%   BMI 22.14 kg/m   Physical Exam  Constitutional: She is oriented to person, place, and time. She appears well-developed and well-nourished.  Non-toxic appearance. No distress.  HENT:  Head: Normocephalic and atraumatic.  Eyes: Conjunctivae, EOM and lids are normal. Pupils are equal, round, and reactive to light.  Neck: Normal range of motion. Neck supple. No tracheal deviation present. No thyroid mass present.  Cardiovascular: Normal rate, regular rhythm and normal heart sounds.  Exam reveals no gallop.   No murmur heard. Pulmonary/Chest: Effort normal and breath sounds normal. No stridor. No respiratory distress. She has no decreased breath sounds. She has no wheezes. She has no rhonchi. She has no rales.  Abdominal: Soft. Normal appearance and bowel sounds are normal. She exhibits no distension. There is no tenderness. There is no rebound and no CVA tenderness.  Musculoskeletal: Normal range of motion. She exhibits no edema or tenderness.  Neurological: She is alert and oriented to person, place, and time. She has normal strength. No cranial nerve deficit or sensory deficit. GCS eye subscore is 4. GCS verbal subscore is 5. GCS motor subscore is 6.  Skin: Skin is warm and dry. No abrasion and no rash noted.  Psychiatric: She has a normal mood and affect. Her speech is normal and behavior is normal.  Nursing note and vitals reviewed.    ED Treatments / Results  Labs (all labs ordered are listed, but only abnormal results are displayed) Labs Reviewed  INFLUENZA PANEL BY PCR (TYPE A & B)    EKG  EKG Interpretation None       Radiology No results found.  Procedures Procedures (including critical care time)  Medications Ordered in ED Medications - No data to display   Initial Impression / Assessment and Plan / ED Course  I have reviewed the triage vital signs and the nursing notes.  Pertinent labs & imaging results that were available during my care of the  patient were reviewed by me and considered in my medical decision making (see chart for details).     Flu test ordered in triage which was negative. Chest x-ray is also negative. Suspect viral illness. Return precautions given  Final Clinical Impressions(s) / ED Diagnoses   Final diagnoses:  SOB (shortness of breath)    New Prescriptions New Prescriptions   No medications on file     Lacretia Leigh, MD 12/10/16 [phone]

## 2016-12-10 NOTE — ED Notes (Signed)
ED Provider at bedside. 

## 2016-12-13 ENCOUNTER — Encounter: Payer: Self-pay | Admitting: Nurse Practitioner

## 2016-12-13 ENCOUNTER — Ambulatory Visit (INDEPENDENT_AMBULATORY_CARE_PROVIDER_SITE_OTHER): Payer: Medicare HMO | Admitting: Nurse Practitioner

## 2016-12-13 VITALS — BP 118/78 | HR 95 | Temp 97.6°F | Wt 132.0 lb

## 2016-12-13 DIAGNOSIS — J069 Acute upper respiratory infection, unspecified: Secondary | ICD-10-CM | POA: Diagnosis not present

## 2016-12-13 DIAGNOSIS — J209 Acute bronchitis, unspecified: Secondary | ICD-10-CM | POA: Diagnosis not present

## 2016-12-13 MED ORDER — OXYMETAZOLINE HCL 0.05 % NA SOLN: 1.0000 | Freq: Two times a day (BID) | NASAL | 0 refills | Status: DC

## 2016-12-13 MED ORDER — SALINE SPRAY 0.65 % NA SOLN: 1.0000 | NASAL | 0 refills | Status: DC | PRN

## 2016-12-13 MED ORDER — FLUTICASONE PROPIONATE 50 MCG/ACT NA SUSP: 2.0000 | Freq: Every day | NASAL | 0 refills | Status: DC

## 2016-12-13 MED ORDER — ALBUTEROL SULFATE HFA 108 (90 BASE) MCG/ACT IN AERS: 2.0000 | INHALATION_SPRAY | Freq: Four times a day (QID) | RESPIRATORY_TRACT | 0 refills | Status: DC | PRN

## 2016-12-13 MED ORDER — DM-GUAIFENESIN ER 30-600 MG PO TB12
1.0000 | ORAL_TABLET | Freq: Two times a day (BID) | ORAL | 0 refills | Status: DC | PRN
Start: 2016-12-13 — End: 2016-12-20

## 2016-12-13 MED ORDER — PROMETHAZINE-DM 6.25-15 MG/5ML PO SYRP: 5.0000 mL | ORAL_SOLUTION | Freq: Three times a day (TID) | ORAL | 0 refills | Status: DC | PRN

## 2016-12-13 NOTE — Patient Instructions (Signed)
URI Instructions: Flonase and Afrin use: apply 1spray of afrin in each nare, wait 23mins, then apply 2sprays of flonase in each nare. Use both nasal spray consecutively x 3days, then flonase only for at least 14days.  Encourage adequate oral hydration.  Use over-the-counter  "cold" medicines  such as "Tylenol cold" , "Advil cold",  "Mucinex" or" Mucinex D"  for cough and congestion.  Avoid decongestants if you have high blood pressure. Use" Delsym" or" Robitussin" cough syrup varietis for cough.  You can use plain "Tylenol" or "Advi"l for fever, chills and achyness.   "Common cold" symptoms are usually triggered by a virus.  The antibiotics are usually not necessary. On average, a" viral cold" illness would take 4-7 days to resolve. Please, make an appointment if you are not better or if you're worse.   Acute Bronchitis, Adult Acute bronchitis is sudden (acute) swelling of the air tubes (bronchi) in the lungs. Acute bronchitis causes these tubes to fill with mucus, which can make it hard to breathe. It can also cause coughing or wheezing. In adults, acute bronchitis usually goes away within 2 weeks. A cough caused by bronchitis may last up to 3 weeks. Smoking, allergies, and asthma can make the condition worse. Repeated episodes of bronchitis may cause further lung problems, such as chronic obstructive pulmonary disease (COPD). What are the causes? This condition can be caused by germs and by substances that irritate the lungs, including:  Cold and flu viruses. This condition is most often caused by the same virus that causes a cold.  Bacteria.  Exposure to tobacco smoke, dust, fumes, and air pollution. What increases the risk? This condition is more likely to develop in people who:  Have close contact with someone with acute bronchitis.  Are exposed to lung irritants, such as tobacco smoke, dust, fumes, and vapors.  Have a weak immune system.  Have a respiratory condition such as  asthma. What are the signs or symptoms? Symptoms of this condition include:  A cough.  Coughing up clear, yellow, or green mucus.  Wheezing.  Chest congestion.  Shortness of breath.  A fever.  Body aches.  Chills.  A sore throat. How is this diagnosed? This condition is usually diagnosed with a physical exam. During the exam, your health care provider may order tests, such as chest X-rays, to rule out other conditions. He or she may also:  Test a sample of your mucus for bacterial infection.  Check the level of oxygen in your blood. This is done to check for pneumonia.  Do a chest X-ray or lung function testing to rule out pneumonia and other conditions.  Perform blood tests. Your health care provider will also ask about your symptoms and medical history. How is this treated? Most cases of acute bronchitis clear up over time without treatment. Your health care provider may recommend:  Drinking more fluids. Drinking more makes your mucus thinner, which may make it easier to breathe.  Taking a medicine for a fever or cough.  Taking an antibiotic medicine.  Using an inhaler to help improve shortness of breath and to control a cough.  Using a cool mist vaporizer or humidifier to make it easier to breathe. Follow these instructions at home: Medicines  Take over-the-counter and prescription medicines only as told by your health care provider.  If you were prescribed an antibiotic, take it as told by your health care provider. Do not stop taking the antibiotic even if you start to feel better. General  instructions  Get plenty of rest.  Drink enough fluids to keep your urine clear or pale yellow.  Avoid smoking and secondhand smoke. Exposure to cigarette smoke or irritating chemicals will make bronchitis worse. If you smoke and you need help quitting, ask your health care provider. Quitting smoking will help your lungs heal faster.  Use an inhaler, cool mist  vaporizer, or humidifier as told by your health care provider.  Keep all follow-up visits as told by your health care provider. This is important. How is this prevented? To lower your risk of getting this condition again:  Wash your hands often with soap and water. If soap and water are not available, use hand sanitizer.  Avoid contact with people who have cold symptoms.  Try not to touch your hands to your mouth, nose, or eyes.  Make sure to get the flu shot every year. Contact a health care provider if:  Your symptoms do not improve in 2 weeks of treatment. Get help right away if:  You cough up blood.  You have chest pain.  You have severe shortness of breath.  You become dehydrated.  You faint or keep feeling like you are going to faint.  You keep vomiting.  You have a severe headache.  Your fever or chills gets worse. This information is not intended to replace advice given to you by your health care provider. Make sure you discuss any questions you have with your health care provider. Document Released: 12/02/2004 Document Revised: 05/19/2016 Document Reviewed: 04/14/2016 Elsevier Interactive Patient Education  2017 [employer].

## 2016-12-13 NOTE — Progress Notes (Signed)
Subjective:  Patient ID: Cindy Higgins, female    DOB: 1945-12-31  Age: 71 y.o. MRN: [id number]  CC: Cough (follow up, went to ED on 2/2 with constant cough---has tried Benzonatate 100mg  that is not working---robittussin helps a little better---but has to sleep sitting straigfht up in chair, and still constant coughing---just got back from [place] about 10 days ago---afebrile---states she has very wierd feeling when she breates thru her nose---wants to use [employer] today if anything is called in)   Cough  This is a new problem. The current episode started in the past 7 days. The problem has been unchanged. The problem occurs constantly. The cough is non-productive. Associated symptoms include chills, ear congestion, ear pain, headaches, myalgias, nasal congestion, postnasal drip and rhinorrhea. Pertinent negatives include no chest pain, rash, shortness of breath or wheezing. The symptoms are aggravated by lying down. She has tried OTC cough suppressant and prescription cough suppressant for the symptoms. The treatment provided no relief. There is no history of asthma, bronchiectasis, bronchitis, COPD, emphysema, environmental allergies or pneumonia.  does not want cough medication with codeine or hydrocodone.  Outpatient Medications Prior to Visit  Medication Sig Dispense Refill  . ALPRAZolam (XANAX) 0.5 MG tablet TAKE ONE TABLET BY MOUTH TWICE DAILY AS NEEDED 60 tablet 1  . aspirin EC 81 MG tablet Take 1 tablet (81 mg total) by mouth daily. 90 tablet 11  . benzonatate (TESSALON) 100 MG capsule Take 1 capsule (100 mg total) by mouth every 8 (eight) hours. 21 capsule 0  . citalopram (CELEXA) 40 MG tablet TAKE ONE TABLET BY MOUTH ONCE DAILY 90 tablet 3  . estradiol (ESTRACE) 0.5 MG tablet Take 0.5 mg by mouth daily.    Marland Kitchen ezetimibe (ZETIA) 10 MG tablet Take 1 tablet (10 mg total) by mouth daily. 90 tablet 3  . levothyroxine (SYNTHROID, LEVOTHROID) 25 MCG tablet Take 1 tablet (25 mcg total) by  mouth daily before breakfast. 90 tablet 3  . atorvastatin (LIPITOR) 20 MG tablet Take 1 tablet (20 mg total) by mouth daily. 90 tablet 3   No facility-administered medications prior to visit.     ROS See HPI  Objective:  BP 118/78   Pulse 95   Temp 97.6 F (36.4 C)   Wt 132 lb (59.9 kg)   SpO2 96%   BMI 22.66 kg/m   BP Readings from Last 3 Encounters:  12/13/16 118/78  12/10/16 104/60  10/14/16 128/78    Wt Readings from Last 3 Encounters:  12/13/16 132 lb (59.9 kg)  12/10/16 129 lb (58.5 kg)  10/14/16 132 lb (59.9 kg)    Physical Exam  Constitutional: She is oriented to person, place, and time.  HENT:  Right Ear: Tympanic membrane, external ear and ear canal normal.  Left Ear: Tympanic membrane, external ear and ear canal normal.  Nose: Mucosal edema and rhinorrhea present. Right sinus exhibits maxillary sinus tenderness. Right sinus exhibits no frontal sinus tenderness. Left sinus exhibits maxillary sinus tenderness. Left sinus exhibits no frontal sinus tenderness.  Mouth/Throat: Uvula is midline. No trismus in the jaw. Posterior oropharyngeal erythema present. No oropharyngeal exudate.  Eyes: No scleral icterus.  Neck: Normal range of motion. Neck supple.  Cardiovascular: Normal rate and normal heart sounds.   Pulmonary/Chest: Effort normal and breath sounds normal. No respiratory distress. She has no wheezes. She has no rales.  Musculoskeletal: She exhibits no edema.  Lymphadenopathy:    She has no cervical adenopathy.  Neurological: She is alert and  oriented to person, place, and time.  Skin: Skin is warm and dry.  Vitals reviewed.   Lab Results  Component Value Date   WBC 7.2 10/14/2016   HGB 14.4 10/14/2016   HCT 41.4 10/14/2016   PLT 321.0 10/14/2016   GLUCOSE 106 (H) 10/14/2016   CHOL 251 (H) 10/14/2016   TRIG 116.0 10/14/2016   HDL 84.80 10/14/2016   LDLDIRECT 161.4 06/13/2013   LDLCALC 143 (H) 10/14/2016   ALT 18 10/14/2016   AST 17 10/14/2016    NA 137 10/14/2016   K 4.0 10/14/2016   CL 98 10/14/2016   CREATININE 0.69 10/14/2016   BUN 15 10/14/2016   CO2 28 10/14/2016   TSH 5.85 (H) 10/14/2016   HGBA1C 5.7 10/14/2016    Dg Chest 2 View  Result Date: 12/10/2016 CLINICAL DATA:  Cough, congestion, flu-like symptoms EXAM: CHEST  2 VIEW COMPARISON:  None. FINDINGS: Lungs are clear.  No pleural effusion or pneumothorax. The heart is normal in size. Visualized osseous structures are within normal limits. IMPRESSION: Normal chest radiographs. Electronically Signed   By: Julian Hy M.D.   On: 12/10/2016 07:29    Assessment & Plan:   Angelique was seen today for cough.  Diagnoses and all orders for this visit:  Acute bronchitis, unspecified organism -     albuterol (PROVENTIL HFA;VENTOLIN HFA) 108 (90 Base) MCG/ACT inhaler; Inhale 2 puffs into the lungs every 6 (six) hours as needed for wheezing or shortness of breath. -     fluticasone (FLONASE) 50 MCG/ACT nasal spray; Place 2 sprays into both nostrils daily. -     oxymetazoline (AFRIN NASAL SPRAY) 0.05 % nasal spray; Place 1 spray into both nostrils 2 (two) times daily. Use only for 3days, then stop -     dextromethorphan-guaiFENesin (MUCINEX DM) 30-600 MG 12hr tablet; Take 1 tablet by mouth 2 (two) times daily as needed for cough. -     promethazine-dextromethorphan (PROMETHAZINE-DM) 6.25-15 MG/5ML syrup; Take 5 mLs by mouth 3 (three) times daily as needed for cough. -     sodium chloride (OCEAN) 0.65 % SOLN nasal spray; Place 1 spray into both nostrils as needed for congestion.  Acute URI -     albuterol (PROVENTIL HFA;VENTOLIN HFA) 108 (90 Base) MCG/ACT inhaler; Inhale 2 puffs into the lungs every 6 (six) hours as needed for wheezing or shortness of breath. -     fluticasone (FLONASE) 50 MCG/ACT nasal spray; Place 2 sprays into both nostrils daily. -     oxymetazoline (AFRIN NASAL SPRAY) 0.05 % nasal spray; Place 1 spray into both nostrils 2 (two) times daily. Use only for  3days, then stop -     dextromethorphan-guaiFENesin (MUCINEX DM) 30-600 MG 12hr tablet; Take 1 tablet by mouth 2 (two) times daily as needed for cough. -     promethazine-dextromethorphan (PROMETHAZINE-DM) 6.25-15 MG/5ML syrup; Take 5 mLs by mouth 3 (three) times daily as needed for cough. -     sodium chloride (OCEAN) 0.65 % SOLN nasal spray; Place 1 spray into both nostrils as needed for congestion.   I have discontinued Ms. Aurelio atorvastatin. I am also having her start on albuterol, fluticasone, oxymetazoline, dextromethorphan-guaiFENesin, promethazine-dextromethorphan, and sodium chloride. Additionally, I am having her maintain her aspirin EC, ezetimibe, levothyroxine, citalopram, ALPRAZolam, estradiol, and benzonatate.  Meds ordered this encounter  Medications  . albuterol (PROVENTIL HFA;VENTOLIN HFA) 108 (90 Base) MCG/ACT inhaler    Sig: Inhale 2 puffs into the lungs every 6 (six) hours as needed  for wheezing or shortness of breath.    Dispense:  1 Inhaler    Refill:  0    Order Specific Question:   Supervising Provider    Answer:   Cassandria Anger [[id number]]  . fluticasone (FLONASE) 50 MCG/ACT nasal spray    Sig: Place 2 sprays into both nostrils daily.    Dispense:  16 g    Refill:  0    Order Specific Question:   Supervising Provider    Answer:   Cassandria Anger [[id number]]  . oxymetazoline (AFRIN NASAL SPRAY) 0.05 % nasal spray    Sig: Place 1 spray into both nostrils 2 (two) times daily. Use only for 3days, then stop    Dispense:  30 mL    Refill:  0    Order Specific Question:   Supervising Provider    Answer:   Cassandria Anger [[id number]]  . dextromethorphan-guaiFENesin (MUCINEX DM) 30-600 MG 12hr tablet    Sig: Take 1 tablet by mouth 2 (two) times daily as needed for cough.    Dispense:  14 tablet    Refill:  0    Order Specific Question:   Supervising Provider    Answer:   Cassandria Anger [[id number]]  . promethazine-dextromethorphan (PROMETHAZINE-DM) 6.25-15 MG/5ML  syrup    Sig: Take 5 mLs by mouth 3 (three) times daily as needed for cough.    Dispense:  240 mL    Refill:  0    Order Specific Question:   Supervising Provider    Answer:   Cassandria Anger [[id number]]  . sodium chloride (OCEAN) 0.65 % SOLN nasal spray    Sig: Place 1 spray into both nostrils as needed for congestion.    Dispense:  15 mL    Refill:  0    Order Specific Question:   Supervising Provider    Answer:   Cassandria Anger [[id number]]    Follow-up: Return if symptoms worsen or fail to improve.  Wilfred Lacy, NP

## 2016-12-20 ENCOUNTER — Ambulatory Visit (INDEPENDENT_AMBULATORY_CARE_PROVIDER_SITE_OTHER): Payer: Medicare HMO | Admitting: Internal Medicine

## 2016-12-20 ENCOUNTER — Encounter: Payer: Self-pay | Admitting: Internal Medicine

## 2016-12-20 DIAGNOSIS — J40 Bronchitis, not specified as acute or chronic: Secondary | ICD-10-CM | POA: Diagnosis not present

## 2016-12-20 MED ORDER — PREDNISONE 20 MG PO TABS: 40.0000 mg | ORAL_TABLET | Freq: Every day | ORAL | 0 refills | Status: DC

## 2016-12-20 NOTE — Progress Notes (Signed)
Pre visit review using our clinic review tool, if applicable. No additional management support is needed unless otherwise documented below in the visit note. 

## 2016-12-20 NOTE — Progress Notes (Signed)
   Subjective:    Patient ID: Cindy Higgins, female    DOB: Oct 12, 1946, 71 y.o.   MRN: [id number]  HPI The patient is a 71 YO female coming in for cough and congestion going on for 1-2 weeks. She was seen for this about 1 week ago and was given albuterol and flonase and cough syrup. She is overall feeling better but still coughing and congestion. No fevers or chills. Still getting somewhat SOB and using the albuterol inhaler some.   Review of Systems  Constitutional: Positive for activity change, appetite change and fatigue. Negative for chills, fever and unexpected weight change.  HENT: Positive for congestion, postnasal drip, rhinorrhea and sinus pressure. Negative for dental problem, drooling, ear discharge, ear pain, sinus pain, sneezing, sore throat and trouble swallowing.   Eyes: Negative.   Respiratory: Positive for cough and shortness of breath. Negative for chest tightness and wheezing.   Cardiovascular: Negative.   Gastrointestinal: Negative.   Musculoskeletal: Negative.   Neurological: Negative.       Objective:   Physical Exam  Constitutional: She appears well-developed and well-nourished.  HENT:  Head: Normocephalic and atraumatic.  Right Ear: External ear normal.  Left Ear: External ear normal.  Oropharynx with redness and clear drainage, some mild nasal crusting, no sinus pressure.   Eyes: EOM are normal.  Neck: Normal range of motion. No JVD present.  Cardiovascular: Normal rate and regular rhythm.   Pulmonary/Chest: Effort normal. No respiratory distress. She has no wheezes.  Some scattered rhonchi which do not clear with cough.   Abdominal: Soft.  Lymphadenopathy:    She has no cervical adenopathy.   Vitals:   12/20/16 1426  BP: 120/68  Pulse: (!) 112  Temp: 97.9 F (36.6 C)  TempSrc: Oral  SpO2: 98%  Weight: 132 lb 8 oz (60.1 kg)  Height: 5\' 4"  (1.626 m)      Assessment & Plan:

## 2016-12-20 NOTE — Patient Instructions (Signed)
We have sent in the prednisone to help. Take 2 pills daily for 5 days.

## 2016-12-22 DIAGNOSIS — J40 Bronchitis, not specified as acute or chronic: Secondary | ICD-10-CM | POA: Insufficient documentation

## 2016-12-22 NOTE — Assessment & Plan Note (Signed)
She will continue using the albuterol and flonase. Rx for prednisone for the rhonchi and nose congestion. No indication for antibiotics at today's visit.

## 2016-12-23 DIAGNOSIS — L719 Rosacea, unspecified: Secondary | ICD-10-CM | POA: Diagnosis not present

## 2016-12-23 DIAGNOSIS — D485 Neoplasm of uncertain behavior of skin: Secondary | ICD-10-CM | POA: Diagnosis not present

## 2016-12-23 DIAGNOSIS — D0439 Carcinoma in situ of skin of other parts of face: Secondary | ICD-10-CM | POA: Diagnosis not present

## 2016-12-28 ENCOUNTER — Other Ambulatory Visit: Payer: Self-pay | Admitting: Obstetrics and Gynecology

## 2016-12-28 DIAGNOSIS — Z1231 Encounter for screening mammogram for malignant neoplasm of breast: Secondary | ICD-10-CM

## 2017-01-11 ENCOUNTER — Ambulatory Visit
Admission: RE | Admit: 2017-01-11 | Discharge: 2017-01-11 | Disposition: A | Discharge status: Home / Self Care | Payer: Medicare HMO | Source: Ambulatory Visit | Attending: Obstetrics and Gynecology | Admitting: Obstetrics and Gynecology

## 2017-01-11 DIAGNOSIS — Z1231 Encounter for screening mammogram for malignant neoplasm of breast: Secondary | ICD-10-CM | POA: Diagnosis not present

## 2017-02-23 ENCOUNTER — Other Ambulatory Visit: Payer: Self-pay | Admitting: Internal Medicine

## 2017-02-23 NOTE — Telephone Encounter (Signed)
Done hardcopy to Shirron  

## 2017-02-24 NOTE — Telephone Encounter (Signed)
Called pt via personal cell phone, letting her know phone system issue and script was ready for pick up at front desk.

## 2017-03-28 DIAGNOSIS — L719 Rosacea, unspecified: Secondary | ICD-10-CM | POA: Diagnosis not present

## 2017-03-28 DIAGNOSIS — L570 Actinic keratosis: Secondary | ICD-10-CM | POA: Diagnosis not present

## 2017-03-28 DIAGNOSIS — D044 Carcinoma in situ of skin of scalp and neck: Secondary | ICD-10-CM | POA: Diagnosis not present

## 2017-04-11 DIAGNOSIS — H1013 Acute atopic conjunctivitis, bilateral: Secondary | ICD-10-CM | POA: Diagnosis not present

## 2017-04-25 DIAGNOSIS — H1013 Acute atopic conjunctivitis, bilateral: Secondary | ICD-10-CM | POA: Diagnosis not present

## 2017-06-02 DIAGNOSIS — R69 Illness, unspecified: Secondary | ICD-10-CM | POA: Diagnosis not present

## 2017-06-30 ENCOUNTER — Other Ambulatory Visit: Payer: Self-pay | Admitting: Internal Medicine

## 2017-06-30 NOTE — Telephone Encounter (Signed)
Check [place] registry last filled 02/26/2017 pls advise...Cindy Higgins

## 2017-06-30 NOTE — Telephone Encounter (Signed)
Done hardcopy to Shirron  

## 2017-07-01 NOTE — Telephone Encounter (Signed)
Faxed script back to [place]...Cindy Higgins

## 2017-09-10 DIAGNOSIS — Z23 Encounter for immunization: Secondary | ICD-10-CM | POA: Diagnosis not present

## 2017-09-20 DIAGNOSIS — R69 Illness, unspecified: Secondary | ICD-10-CM | POA: Diagnosis not present

## 2017-10-17 ENCOUNTER — Telehealth: Payer: Self-pay | Admitting: Internal Medicine

## 2017-10-17 DIAGNOSIS — Z0000 Encounter for general adult medical examination without abnormal findings: Secondary | ICD-10-CM

## 2017-10-17 NOTE — Telephone Encounter (Signed)
Patient has moved CPE to January due to snow.  Would like to have labs entered to have done before CPE.  Please follow up in regard.

## 2017-10-18 ENCOUNTER — Encounter: Payer: Self-pay | Admitting: Internal Medicine

## 2017-10-26 ENCOUNTER — Other Ambulatory Visit: Payer: Self-pay | Admitting: Internal Medicine

## 2017-10-26 NOTE — Telephone Encounter (Signed)
celexa and xanax done erx  Shirron to call pt and ask for pt to make ROV for further refills, please

## 2017-11-18 ENCOUNTER — Encounter: Payer: Self-pay | Admitting: Internal Medicine

## 2017-11-29 ENCOUNTER — Telehealth: Payer: Self-pay

## 2017-11-29 ENCOUNTER — Other Ambulatory Visit (INDEPENDENT_AMBULATORY_CARE_PROVIDER_SITE_OTHER): Payer: Medicare HMO

## 2017-11-29 DIAGNOSIS — E538 Deficiency of other specified B group vitamins: Secondary | ICD-10-CM

## 2017-11-29 DIAGNOSIS — Z1159 Encounter for screening for other viral diseases: Secondary | ICD-10-CM

## 2017-11-29 DIAGNOSIS — Z0000 Encounter for general adult medical examination without abnormal findings: Secondary | ICD-10-CM | POA: Diagnosis not present

## 2017-11-29 DIAGNOSIS — E039 Hypothyroidism, unspecified: Secondary | ICD-10-CM | POA: Diagnosis not present

## 2017-11-29 DIAGNOSIS — E559 Vitamin D deficiency, unspecified: Secondary | ICD-10-CM | POA: Diagnosis not present

## 2017-11-29 LAB — URINALYSIS, ROUTINE W REFLEX MICROSCOPIC
BILIRUBIN URINE: NEGATIVE
Hgb urine dipstick: NEGATIVE
LEUKOCYTES UA: NEGATIVE
Nitrite: NEGATIVE
PH: 6 (ref 5.0–8.0)
RBC / HPF: NONE SEEN (ref 0–?)
Specific Gravity, Urine: 1.02 (ref 1.000–1.030)
Total Protein, Urine: NEGATIVE
URINE GLUCOSE: NEGATIVE
Urobilinogen, UA: 0.2 (ref 0.0–1.0)
WBC UA: NONE SEEN (ref 0–?)

## 2017-11-29 LAB — BASIC METABOLIC PANEL
BUN: 14 mg/dL (ref 6–23)
CALCIUM: 9.6 mg/dL (ref 8.4–10.5)
CO2: 28 mEq/L (ref 19–32)
Chloride: 100 mEq/L (ref 96–112)
Creatinine, Ser: 0.66 mg/dL (ref 0.40–1.20)
GFR: 94.31 mL/min (ref >60.00)
Glucose, Bld: 123 mg/dL — ABNORMAL HIGH (ref 70–99)
Potassium: 5 mEq/L (ref 3.5–5.1)
Sodium: 137 mEq/L (ref 135–145)

## 2017-11-29 LAB — CBC WITH DIFFERENTIAL/PLATELET
BASOS ABS: 0.1 10*3/uL (ref 0.0–0.1)
BASOS PCT: 2.2 % (ref 0.0–3.0)
EOS PCT: 3.4 % (ref 0.0–5.0)
Eosinophils Absolute: 0.2 10*3/uL (ref 0.0–0.7)
HEMATOCRIT: 42.3 % (ref 36.0–46.0)
Hemoglobin: 14.5 g/dL (ref 12.0–15.0)
LYMPHS ABS: 1.8 10*3/uL (ref 0.7–4.0)
Lymphocytes Relative: 33.2 % (ref 12.0–46.0)
MCHC: 34.2 g/dL (ref 30.0–36.0)
MCV: 101.4 fl — ABNORMAL HIGH (ref 78.0–100.0)
MONOS PCT: 9.2 % (ref 3.0–12.0)
Monocytes Absolute: 0.5 10*3/uL (ref 0.1–1.0)
NEUTROS ABS: 2.9 10*3/uL (ref 1.4–7.7)
NEUTROS PCT: 52 % (ref 43.0–77.0)
PLATELETS: 335 10*3/uL (ref 150.0–400.0)
RBC: 4.17 Mil/uL (ref 3.87–5.11)
RDW: 13.8 % (ref 11.5–15.5)
WBC: 5.5 10*3/uL (ref 4.0–10.5)

## 2017-11-29 LAB — LIPID PANEL
CHOL/HDL RATIO: 3
CHOLESTEROL: 255 mg/dL — AB (ref 0–200)
HDL: 80.4 mg/dL (ref >39.00)
LDL Cholesterol: 148 mg/dL — ABNORMAL HIGH (ref 0–99)
NonHDL: 174.21
TRIGLYCERIDES: 132 mg/dL (ref 0.0–149.0)
VLDL: 26.4 mg/dL (ref 0.0–40.0)

## 2017-11-29 LAB — TSH: TSH: 3.28 u[IU]/mL (ref 0.35–4.50)

## 2017-11-29 LAB — HEPATIC FUNCTION PANEL
ALT: 14 U/L (ref 0–35)
AST: 17 U/L (ref 0–37)
Albumin: 4.3 g/dL (ref 3.5–5.2)
Alkaline Phosphatase: 63 U/L (ref 39–117)
BILIRUBIN DIRECT: 0.1 mg/dL (ref 0.0–0.3)
BILIRUBIN TOTAL: 0.5 mg/dL (ref 0.2–1.2)
TOTAL PROTEIN: 7.9 g/dL (ref 6.0–8.3)

## 2017-11-29 LAB — VITAMIN D 25 HYDROXY (VIT D DEFICIENCY, FRACTURES): VITD: 29.86 ng/mL — AB (ref 30.00–100.00)

## 2017-11-29 LAB — VITAMIN B12: VITAMIN B 12: 268 pg/mL (ref 211–911)

## 2017-11-29 LAB — T4, FREE: Free T4: 0.78 ng/dL (ref 0.60–1.60)

## 2017-11-29 NOTE — Telephone Encounter (Signed)
Pt wanted hep c testing done also while having labs drawn per lab

## 2017-11-30 LAB — HEPATITIS C ANTIBODY
HEP C AB: NONREACTIVE
SIGNAL TO CUT-OFF: 0.03 (ref <1.00)

## 2017-12-01 ENCOUNTER — Ambulatory Visit (INDEPENDENT_AMBULATORY_CARE_PROVIDER_SITE_OTHER): Payer: Medicare HMO | Admitting: Internal Medicine

## 2017-12-01 ENCOUNTER — Encounter: Payer: Self-pay | Admitting: Internal Medicine

## 2017-12-01 ENCOUNTER — Ambulatory Visit (INDEPENDENT_AMBULATORY_CARE_PROVIDER_SITE_OTHER)
Admission: RE | Admit: 2017-12-01 | Discharge: 2017-12-01 | Disposition: A | Discharge status: Home / Self Care | Payer: Medicare HMO | Source: Ambulatory Visit | Attending: Internal Medicine | Admitting: Internal Medicine

## 2017-12-01 VITALS — BP 112/72 | Temp 98.1°F | Ht 64.0 in | Wt 133.0 lb

## 2017-12-01 DIAGNOSIS — R05 Cough: Secondary | ICD-10-CM

## 2017-12-01 DIAGNOSIS — E538 Deficiency of other specified B group vitamins: Secondary | ICD-10-CM | POA: Insufficient documentation

## 2017-12-01 DIAGNOSIS — J309 Allergic rhinitis, unspecified: Secondary | ICD-10-CM | POA: Diagnosis not present

## 2017-12-01 DIAGNOSIS — R059 Cough, unspecified: Secondary | ICD-10-CM | POA: Insufficient documentation

## 2017-12-01 DIAGNOSIS — Z9109 Other allergy status, other than to drugs and biological substances: Secondary | ICD-10-CM

## 2017-12-01 DIAGNOSIS — E559 Vitamin D deficiency, unspecified: Secondary | ICD-10-CM | POA: Diagnosis not present

## 2017-12-01 DIAGNOSIS — D7589 Other specified diseases of blood and blood-forming organs: Secondary | ICD-10-CM | POA: Diagnosis not present

## 2017-12-01 DIAGNOSIS — R7302 Impaired glucose tolerance (oral): Secondary | ICD-10-CM

## 2017-12-01 DIAGNOSIS — Z0000 Encounter for general adult medical examination without abnormal findings: Secondary | ICD-10-CM | POA: Diagnosis not present

## 2017-12-01 LAB — POCT GLYCOSYLATED HEMOGLOBIN (HGB A1C): HEMOGLOBIN A1C: 5.3

## 2017-12-01 MED ORDER — EZETIMIBE 10 MG PO TABS: 10.0000 mg | ORAL_TABLET | Freq: Every day | ORAL | 3 refills | Status: DC

## 2017-12-01 NOTE — Progress Notes (Signed)
Subjective:    Patient ID: Cindy Higgins, female    DOB: June 15, 1946, 72 y.o.   MRN: [id number]  HPI  Here for wellness and f/u;  Overall doing ok;  Pt denies Chest pain, worsening SOB, DOE, wheezing, orthopnea, PND, worsening LE edema, palpitations, dizziness or syncope.  Pt denies neurological change such as new headache, facial or extremity weakness.  Pt denies polydipsia, polyuria, or low sugar symptoms. Pt states overall good compliance with treatment and medications, good tolerability, and has been trying to follow appropriate diet.  Pt denies worsening depressive symptoms, suicidal ideation or panic. No fever, night sweats, wt loss, loss of appetite, or other constitutional symptoms.  Pt states good ability with ADL's, has low fall risk, home safety reviewed and adequate, no other significant changes in hearing or vision, and only occasionally active with exercise. Otherwise states has had a rough year, started with trip to [place] at 6 degrees, fell ill with bronchitis on getting back, did not seem to help wih tx per ED, then saw Baldo Ash NP here , tx with albuterol MDI, prednisone and flonase, saline soln and cough med.  Finally cough improved.  Then had episode blepharitis bilat upper lids, saw several provoders then Dr Delman Cheadle with eye drops and oral medication.  Seen in ED feb 2018 with neg cxr.  Since then with persistent cough, just cant seem to resolved, non prod, asks to see  Allergy and cxr,   Does not want statin, but willing to restart zetia. Also stopped taking the Vit d and B12 but willing to restart Past Medical History:  Diagnosis Date  . ALLERGIC RHINITIS 12/22/2007  . Anxiety   . ANXIETY DEPRESSION 08/30/2007  . BLURRED VISION, INTERMITTENT 12/22/2007  . COLONIC POLYPS, HX OF 12/22/2007   01/18/2006  . GLUCOSE INTOLERANCE 07/08/2009  . Headache(784.0) 12/22/2007  . Herpes zoster of eye 05/10/2011  . HYPERCHOLESTEROLEMIA 08/30/2007  . HYPERLIPIDEMIA 03/26/2008  . Impaired glucose  tolerance 05/09/2011  . MITRAL VALVE PROLAPSE, HX OF 08/30/2007  . OSTEOPOROSIS 12/22/2007  . POSTMENOPAUSAL STATUS 08/30/2007  . SINUSITIS- ACUTE-NOS 02/06/2010   Past Surgical History:  Procedure Laterality Date  . ABDOMINAL HYSTERECTOMY    . COLONOSCOPY    . COSMETIC SURGERY    . DILATION AND CURETTAGE OF UTERUS  2007  . OOPHORECTOMY    . POLYPECTOMY      reports that  has never smoked. she has never used smokeless tobacco. She reports that she drinks about 1.0 oz of alcohol per week. She reports that she does not use drugs. family history includes Alcohol abuse in her father; Breast cancer in her maternal grandmother and sister; Cancer in her sister; Diabetes in her mother; Heart disease in her mother; Hyperlipidemia in her mother; Testicular cancer in her son. Allergies  Allergen Reactions  . Sulfa Antibiotics Anaphylaxis  . Scallops [Shellfish Allergy] Nausea And Vomiting  . Codeine Itching and Rash  . Penicillins Itching    Has patient had a PCN reaction causing immediate rash, facial/tongue/throat swelling, SOB or lightheadedness with hypotension:  Unknown think it was rash Has patient had a PCN reaction causing severe rash involving mucus membranes or skin necrosis: unknown childhood Has patient had a PCN reaction that required hospitalization: unknown Has patient had a PCN reaction occurring within the last 10 years: no childhood If all of the above answers are "NO", then may proceed with Cephalosporin use.     Current Outpatient Medications on File Prior to Visit  Medication Sig  Dispense Refill  . albuterol (PROVENTIL HFA;VENTOLIN HFA) 108 (90 Base) MCG/ACT inhaler Inhale 2 puffs into the lungs every 6 (six) hours as needed for wheezing or shortness of breath. 1 Inhaler 0  . ALPRAZolam (XANAX) 0.5 MG tablet TAKE 1 TABLET BY MOUTH TWICE DAILY AS NEEDED 60 tablet 0  . aspirin EC 81 MG tablet Take 1 tablet (81 mg total) by mouth daily. 90 tablet 11  . citalopram (CELEXA) 40 MG  tablet TAKE ONE TABLET BY MOUTH ONCE DAILY 90 tablet 0  . estradiol (ESTRACE) 0.5 MG tablet Take 0.5 mg by mouth daily.    . fluticasone (FLONASE) 50 MCG/ACT nasal spray Place 2 sprays into both nostrils daily. 16 g 0  . levothyroxine (SYNTHROID, LEVOTHROID) 25 MCG tablet Take 1 tablet (25 mcg total) by mouth daily before breakfast. 90 tablet 3  . predniSONE (DELTASONE) 20 MG tablet Take 2 tablets (40 mg total) by mouth daily with breakfast. 10 tablet 0  . promethazine-dextromethorphan (PROMETHAZINE-DM) 6.25-15 MG/5ML syrup Take 5 mLs by mouth 3 (three) times daily as needed for cough. 240 mL 0  . sodium chloride (OCEAN) 0.65 % SOLN nasal spray Place 1 spray into both nostrils as needed for congestion. 15 mL 0   No current facility-administered medications on file prior to visit.    Review of Systems Constitutional: Negative for other unusual diaphoresis, sweats, appetite or weight changes HENT: Negative for other worsening hearing loss, ear pain, facial swelling, mouth sores or neck stiffness.   Eyes: Negative for other worsening pain, redness or other visual disturbance.  Respiratory: Negative for other stridor or swelling Cardiovascular: Negative for other palpitations or other chest pain  Gastrointestinal: Negative for worsening diarrhea or loose stools, blood in stool, distention or other pain Genitourinary: Negative for hematuria, flank pain or other change in urine volume.  Musculoskeletal: Negative for myalgias or other joint swelling.  Skin: Negative for other color change, or other wound or worsening drainage.  Neurological: Negative for other syncope or numbness. Hematological: Negative for other adenopathy or swelling Psychiatric/Behavioral: Negative for hallucinations, other worsening agitation, SI, self-injury, or new decreased concentration All other system neg per pt    Objective:   Physical Exam BP 112/72   Temp 98.1 F (36.7 C) (Oral)   Ht 5\' 4"  (1.626 m)   Wt 133 lb  (60.3 kg)   BMI 22.83 kg/m  VS noted,  Constitutional: Pt is oriented to person, place, and time. Appears well-developed and well-nourished, in no significant distress and comfortable Head: Normocephalic and atraumatic  Eyes: Conjunctivae and EOM are normal. Pupils are equal, round, and reactive to light Right Ear: External ear normal without discharge Left Ear: External ear normal without discharge Nose: Nose without discharge or deformity Mouth/Throat: Oropharynx is without other ulcerations and moist  Neck: Normal range of motion. Neck supple. No JVD present. No tracheal deviation present or significant neck LA or mass Cardiovascular: Normal rate, regular rhythm, normal heart sounds and intact distal pulses.   Pulmonary/Chest: WOB normal and breath sounds without rales or wheezing  Abdominal: Soft. Bowel sounds are normal. NT. No HSM  Musculoskeletal: Normal range of motion. Exhibits no edema Lymphadenopathy: Has no other cervical adenopathy.  Neurological: Pt is alert and oriented to person, place, and time. Pt has normal reflexes. No cranial nerve deficit. Motor grossly intact, Gait intact Skin: Skin is warm and dry. No rash noted or new ulcerations Psychiatric:  Has normal mood and affect. Behavior is normal without agitation No other exam  findings  POCT a1c - POCT glycosylated hemoglobin (Hb A1C)  Component 16:10  Hemoglobin A1C 5.3        Lab Results  Component Value Date   WBC 5.5 11/29/2017   HGB 14.5 11/29/2017   HCT 42.3 11/29/2017   PLT 335.0 11/29/2017   GLUCOSE 123 (H) 11/29/2017   CHOL 255 (H) 11/29/2017   TRIG 132.0 11/29/2017   HDL 80.40 11/29/2017   LDLDIRECT 161.4 06/13/2013   LDLCALC 148 (H) 11/29/2017   ALT 14 11/29/2017   AST 17 11/29/2017   NA 137 11/29/2017   K 5.0 11/29/2017   CL 100 11/29/2017   CREATININE 0.66 11/29/2017   BUN 14 11/29/2017   CO2 28 11/29/2017   TSH 3.28 11/29/2017   HGBA1C 5.3 12/01/2017       Assessment & Plan:

## 2017-12-01 NOTE — Patient Instructions (Addendum)
Your A1c was OK today  Please restart your OTC Vit  B12 and D supplements  Please continue all other medications as before, and refills have been done if requested.  Please have the pharmacy call with any other refills you may need.  Please continue your efforts at being more active, low cholesterol diet, and weight control.  You are otherwise up to date with prevention measures today.  Please keep your appointments with your specialists as you may have planned  You will be contacted regarding the referral for: Allergy  Please go to the XRAY Department in the Basement (go straight as you get off the elevator) for the x-ray testing  Please return in 1 year for your yearly visit, or sooner if needed, with Lab testing done 3-5 days before

## 2017-12-04 DIAGNOSIS — Z9109 Other allergy status, other than to drugs and biological substances: Secondary | ICD-10-CM | POA: Insufficient documentation

## 2017-12-04 NOTE — Assessment & Plan Note (Signed)
To restart b12 supplement

## 2017-12-04 NOTE — Assessment & Plan Note (Signed)

## 2017-12-04 NOTE — Assessment & Plan Note (Signed)
To restart otc supplement

## 2017-12-04 NOTE — Assessment & Plan Note (Signed)
stable overall by history and exam, recent data reviewed with pt, and pt to continue medical treatment as before,  to f/u any worsening symptoms or concerns Lab Results  Component Value Date   HGBA1C 5.3 12/01/2017

## 2017-12-04 NOTE — Assessment & Plan Note (Signed)
For allergy referral per pt reqeust

## 2017-12-04 NOTE — Assessment & Plan Note (Signed)
To restart b12 oral

## 2017-12-04 NOTE — Assessment & Plan Note (Signed)
liekly due to allergy post nasal gtt but also for cxr

## 2017-12-09 ENCOUNTER — Other Ambulatory Visit: Payer: Self-pay | Admitting: Internal Medicine

## 2018-01-02 ENCOUNTER — Other Ambulatory Visit: Payer: Self-pay | Admitting: Internal Medicine

## 2018-01-02 NOTE — Telephone Encounter (Signed)
Done erx 

## 2018-01-03 DIAGNOSIS — R69 Illness, unspecified: Secondary | ICD-10-CM | POA: Diagnosis not present

## 2018-01-04 DIAGNOSIS — J3089 Other allergic rhinitis: Secondary | ICD-10-CM | POA: Diagnosis not present

## 2018-01-04 DIAGNOSIS — H1045 Other chronic allergic conjunctivitis: Secondary | ICD-10-CM | POA: Diagnosis not present

## 2018-01-27 ENCOUNTER — Other Ambulatory Visit: Payer: Self-pay | Admitting: Internal Medicine

## 2018-01-27 ENCOUNTER — Other Ambulatory Visit: Payer: Self-pay | Admitting: Obstetrics and Gynecology

## 2018-01-27 DIAGNOSIS — Z1231 Encounter for screening mammogram for malignant neoplasm of breast: Secondary | ICD-10-CM

## 2018-02-14 ENCOUNTER — Ambulatory Visit
Admission: RE | Admit: 2018-02-14 | Discharge: 2018-02-14 | Disposition: A | Discharge status: Home / Self Care | Payer: Medicare HMO | Source: Ambulatory Visit | Attending: Obstetrics and Gynecology | Admitting: Obstetrics and Gynecology

## 2018-02-14 DIAGNOSIS — Z01419 Encounter for gynecological examination (general) (routine) without abnormal findings: Secondary | ICD-10-CM | POA: Diagnosis not present

## 2018-02-14 DIAGNOSIS — Z1231 Encounter for screening mammogram for malignant neoplasm of breast: Secondary | ICD-10-CM | POA: Diagnosis not present

## 2018-02-15 ENCOUNTER — Other Ambulatory Visit: Payer: Self-pay | Admitting: Obstetrics and Gynecology

## 2018-02-15 DIAGNOSIS — M8580 Other specified disorders of bone density and structure, unspecified site: Secondary | ICD-10-CM

## 2018-04-10 ENCOUNTER — Ambulatory Visit
Admission: RE | Admit: 2018-04-10 | Discharge: 2018-04-10 | Disposition: A | Discharge status: Home / Self Care | Payer: Medicare HMO | Source: Ambulatory Visit | Attending: Obstetrics and Gynecology | Admitting: Obstetrics and Gynecology

## 2018-04-10 DIAGNOSIS — M85851 Other specified disorders of bone density and structure, right thigh: Secondary | ICD-10-CM | POA: Diagnosis not present

## 2018-04-10 DIAGNOSIS — M8580 Other specified disorders of bone density and structure, unspecified site: Secondary | ICD-10-CM

## 2018-04-10 DIAGNOSIS — Z780 Asymptomatic menopausal state: Secondary | ICD-10-CM | POA: Diagnosis not present

## 2018-07-04 ENCOUNTER — Other Ambulatory Visit: Payer: Self-pay | Admitting: Internal Medicine

## 2018-07-04 NOTE — Telephone Encounter (Signed)
Done erx 

## 2018-07-11 DIAGNOSIS — R69 Illness, unspecified: Secondary | ICD-10-CM | POA: Diagnosis not present

## 2018-07-26 DIAGNOSIS — H1045 Other chronic allergic conjunctivitis: Secondary | ICD-10-CM | POA: Diagnosis not present

## 2018-07-26 DIAGNOSIS — J3089 Other allergic rhinitis: Secondary | ICD-10-CM | POA: Diagnosis not present

## 2018-08-06 ENCOUNTER — Other Ambulatory Visit: Payer: Self-pay | Admitting: Internal Medicine

## 2018-08-10 DIAGNOSIS — Z23 Encounter for immunization: Secondary | ICD-10-CM | POA: Diagnosis not present

## 2018-08-18 DIAGNOSIS — H524 Presbyopia: Secondary | ICD-10-CM | POA: Diagnosis not present

## 2018-08-18 DIAGNOSIS — Z0100 Encounter for examination of eyes and vision without abnormal findings: Secondary | ICD-10-CM | POA: Diagnosis not present

## 2018-12-25 ENCOUNTER — Other Ambulatory Visit: Payer: Self-pay | Admitting: Internal Medicine

## 2019-01-02 ENCOUNTER — Other Ambulatory Visit: Payer: Self-pay | Admitting: Internal Medicine

## 2019-01-02 NOTE — Telephone Encounter (Signed)
Done erx 

## 2019-01-09 ENCOUNTER — Encounter: Payer: Self-pay | Admitting: Internal Medicine

## 2019-01-09 ENCOUNTER — Ambulatory Visit (INDEPENDENT_AMBULATORY_CARE_PROVIDER_SITE_OTHER): Payer: Medicare HMO | Admitting: Internal Medicine

## 2019-01-09 VITALS — BP 124/78 | HR 93 | Temp 97.6°F | Ht 64.0 in | Wt 138.0 lb

## 2019-01-09 DIAGNOSIS — Z0000 Encounter for general adult medical examination without abnormal findings: Secondary | ICD-10-CM | POA: Diagnosis not present

## 2019-01-09 DIAGNOSIS — E7800 Pure hypercholesterolemia, unspecified: Secondary | ICD-10-CM

## 2019-01-09 DIAGNOSIS — E538 Deficiency of other specified B group vitamins: Secondary | ICD-10-CM | POA: Diagnosis not present

## 2019-01-09 DIAGNOSIS — M810 Age-related osteoporosis without current pathological fracture: Secondary | ICD-10-CM

## 2019-01-09 DIAGNOSIS — E559 Vitamin D deficiency, unspecified: Secondary | ICD-10-CM

## 2019-01-09 DIAGNOSIS — R7302 Impaired glucose tolerance (oral): Secondary | ICD-10-CM | POA: Diagnosis not present

## 2019-01-09 MED ORDER — CITALOPRAM HYDROBROMIDE 40 MG PO TABS: 40.0000 mg | ORAL_TABLET | Freq: Every day | ORAL | 3 refills | Status: DC

## 2019-01-09 MED ORDER — ALPRAZOLAM 0.5 MG PO TABS: 0.5000 mg | ORAL_TABLET | Freq: Two times a day (BID) | ORAL | 2 refills | Status: DC | PRN

## 2019-01-09 MED ORDER — LEVOTHYROXINE SODIUM 25 MCG PO TABS: ORAL_TABLET | ORAL | 3 refills | Status: DC

## 2019-01-09 NOTE — Assessment & Plan Note (Signed)
Pt to consider prolia start

## 2019-01-09 NOTE — Assessment & Plan Note (Signed)

## 2019-01-09 NOTE — Assessment & Plan Note (Signed)
For f/u lab 

## 2019-01-09 NOTE — Assessment & Plan Note (Signed)
For a1c with labs

## 2019-01-09 NOTE — Progress Notes (Signed)
Subjective:    Patient ID: Cindy Higgins, female    DOB: 11-Jan-1946, 73 y.o.   MRN: [id number]  HPI  Here for wellness and f/u;  Overall doing ok;  Pt denies Chest pain, worsening SOB, DOE, wheezing, orthopnea, PND, worsening LE edema, palpitations, dizziness or syncope.  Pt denies neurological change such as new headache, facial or extremity weakness.  Pt denies polydipsia, polyuria, or low sugar symptoms. Pt states overall good compliance with treatment and medications, good tolerability, and has been trying to follow appropriate diet.  Pt denies worsening depressive symptoms, suicidal ideation or panic. No fever, night sweats, wt loss, loss of appetite, or other constitutional symptoms.  Pt states good ability with ADL's, has low fall risk, home safety reviewed and adequate, no other significant changes in hearing or vision, and only occasionally active with exercise.  Has been intolerant  Bisphosphonates, might consider prolia but not now.   Past Medical History:  Diagnosis Date  . ALLERGIC RHINITIS 12/22/2007  . Anxiety   . ANXIETY DEPRESSION 08/30/2007  . BLURRED VISION, INTERMITTENT 12/22/2007  . COLONIC POLYPS, HX OF 12/22/2007   01/18/2006  . GLUCOSE INTOLERANCE 07/08/2009  . Headache(784.0) 12/22/2007  . Herpes zoster of eye 05/10/2011  . HYPERCHOLESTEROLEMIA 08/30/2007  . HYPERLIPIDEMIA 03/26/2008  . Impaired glucose tolerance 05/09/2011  . MITRAL VALVE PROLAPSE, HX OF 08/30/2007  . OSTEOPOROSIS 12/22/2007  . POSTMENOPAUSAL STATUS 08/30/2007  . SINUSITIS- ACUTE-NOS 02/06/2010   Past Surgical History:  Procedure Laterality Date  . ABDOMINAL HYSTERECTOMY    . COLONOSCOPY    . COSMETIC SURGERY    . DILATION AND CURETTAGE OF UTERUS  2007  . OOPHORECTOMY    . POLYPECTOMY      reports that she has never smoked. She has never used smokeless tobacco. She reports current alcohol use of about 2.0 standard drinks of alcohol per week. She reports that she does not use drugs. family history  includes Alcohol abuse in her father; Breast cancer in her maternal grandmother and sister; Cancer in her sister; Diabetes in her mother; Heart disease in her mother; Hyperlipidemia in her mother; Testicular cancer in her son. Allergies  Allergen Reactions  . Sulfa Antibiotics Anaphylaxis  . Scallops [Shellfish Allergy] Nausea And Vomiting  . Codeine Itching and Rash  . Penicillins Itching    Has patient had a PCN reaction causing immediate rash, facial/tongue/throat swelling, SOB or lightheadedness with hypotension:  Unknown think it was rash Has patient had a PCN reaction causing severe rash involving mucus membranes or skin necrosis: unknown childhood Has patient had a PCN reaction that required hospitalization: unknown Has patient had a PCN reaction occurring within the last 10 years: no childhood If all of the above answers are "NO", then may proceed with Cephalosporin use.     Current Outpatient Medications on File Prior to Visit  Medication Sig Dispense Refill  . estradiol (ESTRACE) 0.5 MG tablet Take 0.5 mg by mouth daily.    . sodium chloride (OCEAN) 0.65 % SOLN nasal spray Place 1 spray into both nostrils as needed for congestion. 15 mL 0   No current facility-administered medications on file prior to visit.    Review of Systems Constitutional: Negative for other unusual diaphoresis, sweats, appetite or weight changes HENT: Negative for other worsening hearing loss, ear pain, facial swelling, mouth sores or neck stiffness.   Eyes: Negative for other worsening pain, redness or other visual disturbance.  Respiratory: Negative for other stridor or swelling Cardiovascular: Negative for other  palpitations or other chest pain  Gastrointestinal: Negative for worsening diarrhea or loose stools, blood in stool, distention or other pain Genitourinary: Negative for hematuria, flank pain or other change in urine volume.  Musculoskeletal: Negative for myalgias or other joint swelling.    Skin: Negative for other color change, or other wound or worsening drainage.  Neurological: Negative for other syncope or numbness. Hematological: Negative for other adenopathy or swelling Psychiatric/Behavioral: Negative for hallucinations, other worsening agitation, SI, self-injury, or new decreased concentration All other system neg per pt    Objective:   Physical Exam BP 124/78   Pulse 93   Temp 97.6 F (36.4 C) (Oral)   Ht 5\' 4"  (1.626 m)   Wt 138 lb (62.6 kg)   SpO2 96%   BMI 23.69 kg/m  VS noted,  Constitutional: Pt is oriented to person, place, and time. Appears well-developed and well-nourished, in no significant distress and comfortable Head: Normocephalic and atraumatic  Eyes: Conjunctivae and EOM are normal. Pupils are equal, round, and reactive to light Right Ear: External ear normal without discharge Left Ear: External ear normal without discharge Nose: Nose without discharge or deformity Mouth/Throat: Oropharynx is without other ulcerations and moist  Neck: Normal range of motion. Neck supple. No JVD present. No tracheal deviation present or significant neck LA or mass Cardiovascular: Normal rate, regular rhythm, normal heart sounds and intact distal pulses.   Pulmonary/Chest: WOB normal and breath sounds without rales or wheezing  Abdominal: Soft. Bowel sounds are normal. NT. No HSM  Musculoskeletal: Normal range of motion. Exhibits no edema Lymphadenopathy: Has no other cervical adenopathy.  Neurological: Pt is alert and oriented to person, place, and time. Pt has normal reflexes. No cranial nerve deficit. Motor grossly intact, Gait intact Skin: Skin is warm and dry. No rash noted or new ulcerations Psychiatric:  Has normal mood and affect. Behavior is normal without agitation No other exam findings Lab Results  Component Value Date   WBC 5.5 11/29/2017   HGB 14.5 11/29/2017   HCT 42.3 11/29/2017   PLT 335.0 11/29/2017   GLUCOSE 123 (H) 11/29/2017   CHOL  255 (H) 11/29/2017   TRIG 132.0 11/29/2017   HDL 80.40 11/29/2017   LDLDIRECT 161.4 06/13/2013   LDLCALC 148 (H) 11/29/2017   ALT 14 11/29/2017   AST 17 11/29/2017   NA 137 11/29/2017   K 5.0 11/29/2017   CL 100 11/29/2017   CREATININE 0.66 11/29/2017   BUN 14 11/29/2017   CO2 28 11/29/2017   TSH 3.28 11/29/2017   HGBA1C 5.3 12/01/2017       Assessment & Plan:

## 2019-01-09 NOTE — Assessment & Plan Note (Signed)
For b12 f/u with labs

## 2019-01-09 NOTE — Assessment & Plan Note (Signed)
For cardiac CT score, low chol diet, has been statin intolerant

## 2019-01-09 NOTE — Patient Instructions (Addendum)
Please call if you change your mind about starting Prolia for bone loss  Please continue all other medications as before, and refills have been done if requested.  Please have the pharmacy call with any other refills you may need.  Please continue your efforts at being more active, low cholesterol diet, and weight control.  You are otherwise up to date with prevention measures today.  Please keep your appointments with your specialists as you may have planned  You will be contacted regarding the referral for: CT Cardiac scoring  Please go to the LAB in the Basement (turn left off the elevator) for the tests to be done today  You will be contacted by phone if any changes need to be made immediately.  Otherwise, you will receive a letter about your results with an explanation, but please check with MyChart first.  Please remember to sign up for MyChart if you have not done so, as this will be important to you in the future with finding out test results, communicating by private email, and scheduling acute appointments online when needed.  Please return in 1 year for your yearly visit, or sooner if needed, with Lab testing done 3-5 days before

## 2019-01-16 ENCOUNTER — Telehealth: Payer: Self-pay

## 2019-01-16 ENCOUNTER — Other Ambulatory Visit: Payer: Self-pay | Admitting: Internal Medicine

## 2019-01-16 ENCOUNTER — Other Ambulatory Visit (INDEPENDENT_AMBULATORY_CARE_PROVIDER_SITE_OTHER): Payer: Medicare HMO

## 2019-01-16 DIAGNOSIS — R7302 Impaired glucose tolerance (oral): Secondary | ICD-10-CM

## 2019-01-16 DIAGNOSIS — E559 Vitamin D deficiency, unspecified: Secondary | ICD-10-CM

## 2019-01-16 DIAGNOSIS — E538 Deficiency of other specified B group vitamins: Secondary | ICD-10-CM

## 2019-01-16 DIAGNOSIS — Z0000 Encounter for general adult medical examination without abnormal findings: Secondary | ICD-10-CM | POA: Diagnosis not present

## 2019-01-16 LAB — HEPATIC FUNCTION PANEL
ALBUMIN: 4.1 g/dL (ref 3.5–5.2)
ALK PHOS: 54 U/L (ref 39–117)
ALT: 10 U/L (ref 0–35)
AST: 15 U/L (ref 0–37)
Bilirubin, Direct: 0.1 mg/dL (ref 0.0–0.3)
TOTAL PROTEIN: 7.2 g/dL (ref 6.0–8.3)
Total Bilirubin: 0.4 mg/dL (ref 0.2–1.2)

## 2019-01-16 LAB — BASIC METABOLIC PANEL
BUN: 19 mg/dL (ref 6–23)
CHLORIDE: 101 meq/L (ref 96–112)
CO2: 28 mEq/L (ref 19–32)
CREATININE: 0.66 mg/dL (ref 0.40–1.20)
Calcium: 9 mg/dL (ref 8.4–10.5)
GFR: 88.44 mL/min (ref >60.00)
Glucose, Bld: 111 mg/dL — ABNORMAL HIGH (ref 70–99)
POTASSIUM: 4.6 meq/L (ref 3.5–5.1)
Sodium: 137 mEq/L (ref 135–145)

## 2019-01-16 LAB — VITAMIN B12: Vitamin B-12: 209 pg/mL — ABNORMAL LOW (ref 211–911)

## 2019-01-16 LAB — CBC WITH DIFFERENTIAL/PLATELET
Basophils Absolute: 0.1 10*3/uL (ref 0.0–0.1)
Basophils Relative: 1.2 % (ref 0.0–3.0)
Eosinophils Absolute: 0.2 10*3/uL (ref 0.0–0.7)
Eosinophils Relative: 3 % (ref 0.0–5.0)
HEMATOCRIT: 39.5 % (ref 36.0–46.0)
Hemoglobin: 13.7 g/dL (ref 12.0–15.0)
LYMPHS PCT: 40.7 % (ref 12.0–46.0)
Lymphs Abs: 2.1 10*3/uL (ref 0.7–4.0)
MCHC: 34.6 g/dL (ref 30.0–36.0)
MCV: 100.9 fl — AB (ref 78.0–100.0)
MONOS PCT: 8.6 % (ref 3.0–12.0)
Monocytes Absolute: 0.4 10*3/uL (ref 0.1–1.0)
NEUTROS ABS: 2.4 10*3/uL (ref 1.4–7.7)
Neutrophils Relative %: 46.5 % (ref 43.0–77.0)
PLATELETS: 323 10*3/uL (ref 150.0–400.0)
RBC: 3.92 Mil/uL (ref 3.87–5.11)
RDW: 13.2 % (ref 11.5–15.5)
WBC: 5.2 10*3/uL (ref 4.0–10.5)

## 2019-01-16 LAB — VITAMIN D 25 HYDROXY (VIT D DEFICIENCY, FRACTURES): VITD: 33.39 ng/mL (ref 30.00–100.00)

## 2019-01-16 LAB — URINALYSIS, ROUTINE W REFLEX MICROSCOPIC
Bilirubin Urine: NEGATIVE
Hgb urine dipstick: NEGATIVE
Ketones, ur: NEGATIVE
LEUKOCYTE UA: NEGATIVE
Nitrite: NEGATIVE
RBC / HPF: NONE SEEN (ref 0–?)
Specific Gravity, Urine: 1.02 (ref 1.000–1.030)
Total Protein, Urine: NEGATIVE
Urine Glucose: NEGATIVE
Urobilinogen, UA: 0.2 (ref 0.0–1.0)
pH: 7 (ref 5.0–8.0)

## 2019-01-16 LAB — LIPID PANEL
CHOLESTEROL: 228 mg/dL — AB (ref 0–200)
HDL: 77.4 mg/dL (ref >39.00)
LDL CALC: 130 mg/dL — AB (ref 0–99)
NonHDL: 150.83
TRIGLYCERIDES: 104 mg/dL (ref 0.0–149.0)
Total CHOL/HDL Ratio: 3
VLDL: 20.8 mg/dL (ref 0.0–40.0)

## 2019-01-16 LAB — TSH: TSH: 3.72 u[IU]/mL (ref 0.35–4.50)

## 2019-01-16 LAB — HEMOGLOBIN A1C: Hgb A1c MFr Bld: 5.8 % (ref 4.6–6.5)

## 2019-01-16 MED ORDER — ROSUVASTATIN CALCIUM 10 MG PO TABS: 10.0000 mg | ORAL_TABLET | Freq: Every day | ORAL | 3 refills | Status: DC

## 2019-01-16 NOTE — Telephone Encounter (Signed)
Pt has viewed results via MyChart  

## 2019-01-16 NOTE — Telephone Encounter (Signed)
-----   Message from Biagio Borg, MD sent at 01/16/2019 12:50 PM EDT ----- Left message on MyChart, pt to cont same tx except  The test results show that your current treatment is OK, except the Vit B12 is low again, and the LDL cholesterol is still moderately elevated.  I will ask Ronelle Smallman from the office to contact you to see if you are taking OTC B12.  If not, you should take one per day.  If you are, we should consider monthly B12 shots to keep this up.  We should also start low dose generic crestor 10 mg per day for the cholesterol, to help reduce the risk of heart disease and stroke in the future.Redmond Baseman to please inform pt, who should start OTC Vit B12 if not taking this, or start monthly shots if already taking the pills, I will do rx for crestor

## 2019-01-18 ENCOUNTER — Other Ambulatory Visit: Payer: Self-pay | Admitting: Obstetrics and Gynecology

## 2019-01-18 DIAGNOSIS — Z1231 Encounter for screening mammogram for malignant neoplasm of breast: Secondary | ICD-10-CM

## 2019-01-30 ENCOUNTER — Telehealth (HOSPITAL_COMMUNITY): Payer: Self-pay

## 2019-01-30 NOTE — Telephone Encounter (Signed)
Patient called in regards to CT appointment cancellation. Patient acknowledged and appreciated the call.

## 2019-02-14 ENCOUNTER — Inpatient Hospital Stay: Admission: RE | Admit: 2019-02-14 | Payer: Self-pay | Source: Ambulatory Visit

## 2019-02-19 ENCOUNTER — Ambulatory Visit: Payer: Self-pay

## 2019-04-06 ENCOUNTER — Ambulatory Visit: Payer: Self-pay

## 2019-04-10 IMAGING — DX DG CHEST 2V
2 series · 2 of 2 positions shown · non-contrast
Comparison: 12/10/2016

CLINICAL DATA: Persistent dry cough.  History of bronchitis.

EXAM:
CHEST  2 VIEW

[chest pa]
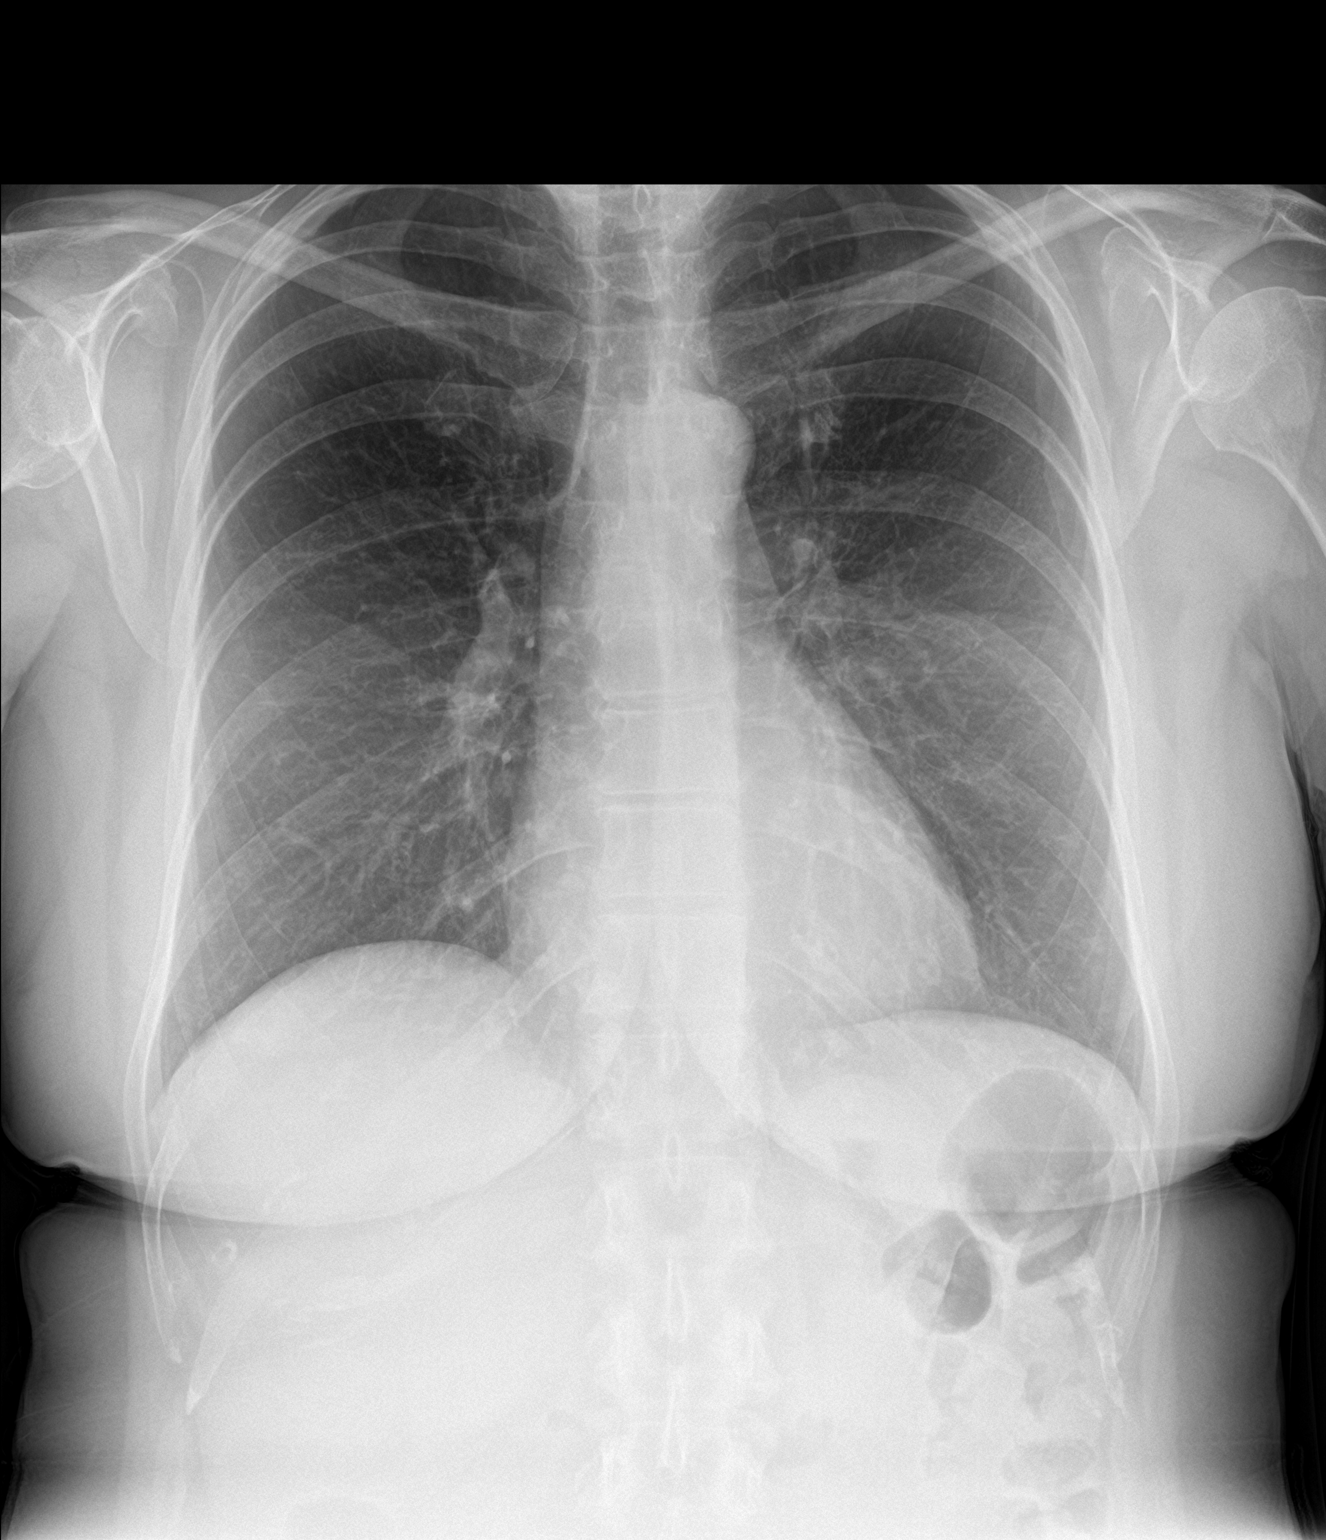

[chest lat]
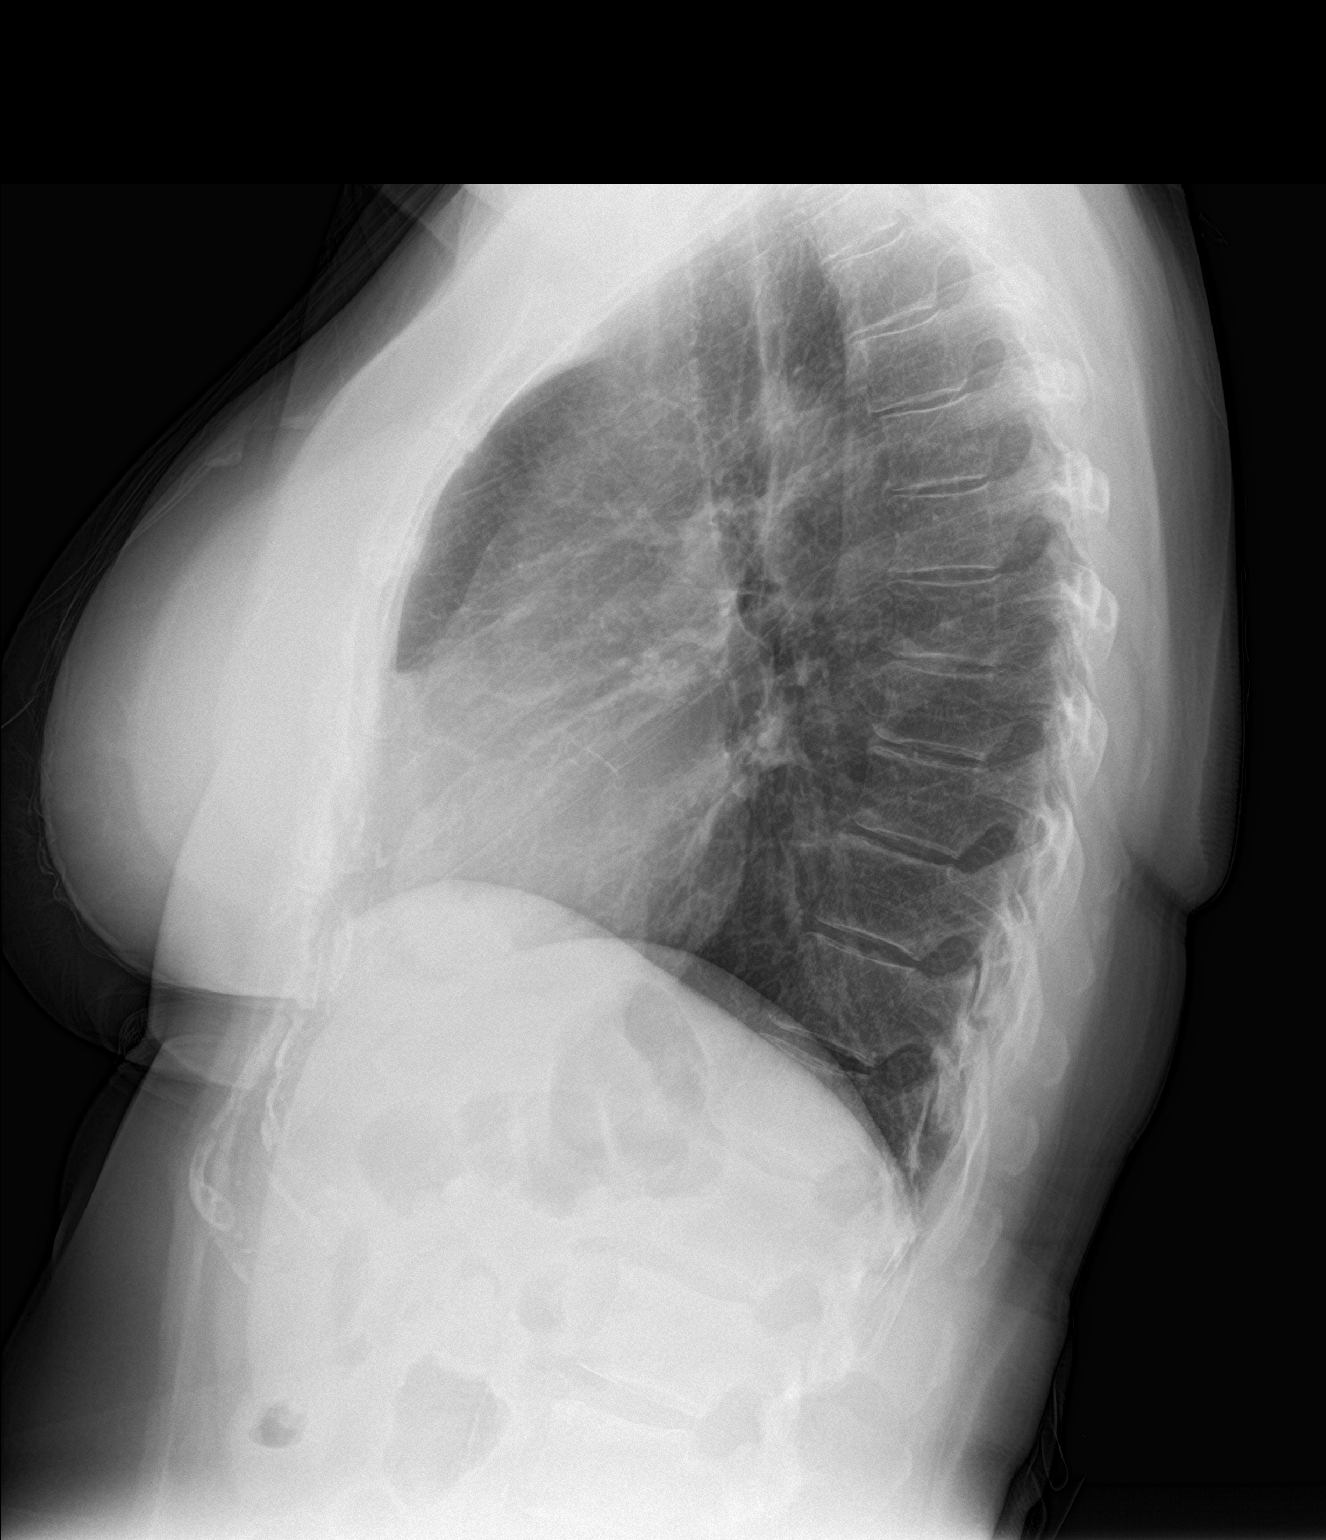

[2 of 2 positions shown; findings below may reference images not displayed]

FINDINGS: Cardiomediastinal silhouette is normal. Mediastinal contours appear
intact.

There is no evidence of focal airspace consolidation, pleural
effusion or pneumothorax.

Osseous structures are without acute abnormality. Soft tissues are
grossly normal.
IMPRESSION: No active cardiopulmonary disease.

## 2019-04-21 DIAGNOSIS — R110 Nausea: Secondary | ICD-10-CM | POA: Diagnosis not present

## 2019-04-21 DIAGNOSIS — Z90710 Acquired absence of both cervix and uterus: Secondary | ICD-10-CM | POA: Diagnosis not present

## 2019-04-21 DIAGNOSIS — W11XXXA Fall on and from ladder, initial encounter: Secondary | ICD-10-CM | POA: Diagnosis not present

## 2019-04-21 DIAGNOSIS — Z885 Allergy status to narcotic agent status: Secondary | ICD-10-CM | POA: Diagnosis not present

## 2019-04-21 DIAGNOSIS — S0990XA Unspecified injury of head, initial encounter: Secondary | ICD-10-CM | POA: Diagnosis not present

## 2019-04-21 DIAGNOSIS — Z882 Allergy status to sulfonamides status: Secondary | ICD-10-CM | POA: Diagnosis not present

## 2019-04-21 DIAGNOSIS — Z880 Allergy status to penicillin: Secondary | ICD-10-CM | POA: Diagnosis not present

## 2019-04-21 DIAGNOSIS — Z888 Allergy status to other drugs, medicaments and biological substances status: Secondary | ICD-10-CM | POA: Diagnosis not present

## 2019-04-21 DIAGNOSIS — E785 Hyperlipidemia, unspecified: Secondary | ICD-10-CM | POA: Diagnosis not present

## 2019-05-25 ENCOUNTER — Other Ambulatory Visit: Payer: Self-pay | Admitting: Obstetrics and Gynecology

## 2019-05-25 DIAGNOSIS — R69 Illness, unspecified: Secondary | ICD-10-CM | POA: Diagnosis not present

## 2019-05-25 DIAGNOSIS — Z01419 Encounter for gynecological examination (general) (routine) without abnormal findings: Secondary | ICD-10-CM | POA: Diagnosis not present

## 2019-05-28 LAB — CYTOLOGY - PAP

## 2019-06-13 ENCOUNTER — Ambulatory Visit
Admission: RE | Admit: 2019-06-13 | Discharge: 2019-06-13 | Disposition: A | Discharge status: Home / Self Care | Payer: Medicare HMO | Source: Ambulatory Visit | Attending: Obstetrics and Gynecology | Admitting: Obstetrics and Gynecology

## 2019-06-13 ENCOUNTER — Ambulatory Visit (INDEPENDENT_AMBULATORY_CARE_PROVIDER_SITE_OTHER)
Admission: RE | Admit: 2019-06-13 | Discharge: 2019-06-13 | Disposition: A | Discharge status: Home / Self Care | Payer: Self-pay | Source: Ambulatory Visit | Attending: Internal Medicine | Admitting: Internal Medicine

## 2019-06-13 ENCOUNTER — Other Ambulatory Visit: Payer: Self-pay

## 2019-06-13 DIAGNOSIS — Z1231 Encounter for screening mammogram for malignant neoplasm of breast: Secondary | ICD-10-CM | POA: Diagnosis not present

## 2019-06-13 DIAGNOSIS — E7800 Pure hypercholesterolemia, unspecified: Secondary | ICD-10-CM

## 2019-06-13 DIAGNOSIS — R7302 Impaired glucose tolerance (oral): Secondary | ICD-10-CM

## 2019-06-28 DIAGNOSIS — R69 Illness, unspecified: Secondary | ICD-10-CM | POA: Diagnosis not present

## 2019-07-02 ENCOUNTER — Encounter: Payer: Self-pay | Admitting: Internal Medicine

## 2019-07-02 ENCOUNTER — Other Ambulatory Visit: Payer: Self-pay

## 2019-07-02 ENCOUNTER — Ambulatory Visit (INDEPENDENT_AMBULATORY_CARE_PROVIDER_SITE_OTHER): Payer: Medicare HMO | Admitting: Internal Medicine

## 2019-07-02 VITALS — BP 168/94 | HR 106 | Temp 98.0°F | Ht 64.0 in | Wt 134.0 lb

## 2019-07-02 DIAGNOSIS — Z23 Encounter for immunization: Secondary | ICD-10-CM | POA: Diagnosis not present

## 2019-07-02 DIAGNOSIS — R51 Headache: Secondary | ICD-10-CM | POA: Diagnosis not present

## 2019-07-02 DIAGNOSIS — R519 Headache, unspecified: Secondary | ICD-10-CM

## 2019-07-02 DIAGNOSIS — R0683 Snoring: Secondary | ICD-10-CM | POA: Diagnosis not present

## 2019-07-02 DIAGNOSIS — I10 Essential (primary) hypertension: Secondary | ICD-10-CM | POA: Diagnosis not present

## 2019-07-02 DIAGNOSIS — R7302 Impaired glucose tolerance (oral): Secondary | ICD-10-CM | POA: Diagnosis not present

## 2019-07-02 DIAGNOSIS — E538 Deficiency of other specified B group vitamins: Secondary | ICD-10-CM

## 2019-07-02 DIAGNOSIS — R69 Illness, unspecified: Secondary | ICD-10-CM | POA: Diagnosis not present

## 2019-07-02 DIAGNOSIS — F341 Dysthymic disorder: Secondary | ICD-10-CM

## 2019-07-02 MED ORDER — ZOSTER VAC RECOMB ADJUVANTED 50 MCG/0.5ML IM SUSR: 0.5000 mL | Freq: Once | INTRAMUSCULAR | 1 refills | Status: AC

## 2019-07-02 MED ORDER — AMLODIPINE BESYLATE 2.5 MG PO TABS: 2.5000 mg | ORAL_TABLET | Freq: Every day | ORAL | 3 refills | Status: DC

## 2019-07-02 MED ORDER — CYANOCOBALAMIN 1000 MCG/ML IJ SOLN
1000.0000 ug | Freq: Once | INTRAMUSCULAR | Status: AC
  Administered 2019-07-02: 1000 ug via INTRAMUSCULAR

## 2019-07-02 NOTE — Patient Instructions (Addendum)
You had the flu shot today  You had the B12 shot today, and please make monthly visit for B12 shots with making a Nurse Visit  Your shingles shot prescription was sent to the pharmacy (but remember to ask about the cost)  Please check your BP at home on a regular basis with the goal being and average of less than 130/80  Please take all new medication as prescribed - the amlodipine (given in hardcopy today)  OK to take alleve as needed  Please call if you change your mind about the referral to pulmonary for possible sleep apnea  Please call for Hand Surgury referral if the hand have more pain in addition to the tingling  OK to take half of the xanax 0.5 mg daily for 4 wks, then stop  Please continue all other medications as before, and refills have been done if requested.  Please have the pharmacy call with any other refills you may need.  Please continue your efforts at being more active, low cholesterol diet, and weight control.  Please keep your appointments with your specialists as you may have planned

## 2019-07-02 NOTE — Progress Notes (Signed)
Subjective:    Patient ID: Cindy Higgins, female    DOB: 1945/12/08, 73 y.o.   MRN: [id number]  HPI  Here to f/u afte, but recent GYN appt with BP 138/80 but stopped her wine, having more headaches, lost 3 lbs in 5 wks .  F/u Bp at [facility] was 141/80. Has BP monitor at home.  Trying to lose wt with less calories.   Wt Readings from Last 3 Encounters:  07/02/19 134 lb (60.8 kg)  01/09/19 138 lb (62.6 kg)  12/01/17 133 lb (60.3 kg)   BP Readings from Last 3 Encounters:  07/02/19 (!) 168/94  01/09/19 124/78  12/01/17 112/72  Does not normally get headaches, has been working on renovating a house and accidentally fell off a ladder the head and buttocks but reports CT head neg in ED.  Did have another trip and fall with fall to the face without injury.  Also c/o pain to eh hands, worse in the left hand thenar eminence.  Also has bilateral hand numbness at times worse in the AM.  Wakes up in the AM with HA, but not feeling refreshed in the AM, husband says snoring at night, denies daytime somnolence.  Recent calclium score was zero.Pt denies chest pain, increased sob or doe, wheezing, orthopnea, PND, increased LE swelling, palpitations, dizziness or syncope. Pt denies new neurological symptoms such as new headache, or facial or extremity weakness or numbness except for the above.   Pt denies polydipsia, polyuria.   Denies worsening depressive symptoms, suicidal ideation, or panic; has ongoing anxiety, but only taking xanax once per day, and wants to try to wean.  Never took the statin and does not want to.start. Past Medical History:  Diagnosis Date  . ALLERGIC RHINITIS 12/22/2007  . Anxiety   . ANXIETY DEPRESSION 08/30/2007  . BLURRED VISION, INTERMITTENT 12/22/2007  . COLONIC POLYPS, HX OF 12/22/2007   01/18/2006  . GLUCOSE INTOLERANCE 07/08/2009  . Headache(784.0) 12/22/2007  . Herpes zoster of eye 05/10/2011  . HYPERCHOLESTEROLEMIA 08/30/2007  . HYPERLIPIDEMIA 03/26/2008  . Impaired glucose  tolerance 05/09/2011  . MITRAL VALVE PROLAPSE, HX OF 08/30/2007  . OSTEOPOROSIS 12/22/2007  . POSTMENOPAUSAL STATUS 08/30/2007  . SINUSITIS- ACUTE-NOS 02/06/2010   Past Surgical History:  Procedure Laterality Date  . ABDOMINAL HYSTERECTOMY    . COLONOSCOPY    . COSMETIC SURGERY    . DILATION AND CURETTAGE OF UTERUS  2007  . OOPHORECTOMY    . POLYPECTOMY      reports that she has never smoked. She has never used smokeless tobacco. She reports current alcohol use of about 2.0 standard drinks of alcohol per week. She reports that she does not use drugs. family history includes Alcohol abuse in her father; Breast cancer in her maternal grandmother and sister; Cancer in her sister; Diabetes in her mother; Heart disease in her mother; Hyperlipidemia in her mother; Testicular cancer in her son. Allergies  Allergen Reactions  . Sulfa Antibiotics Anaphylaxis  . Scallops [Shellfish Allergy] Nausea And Vomiting  . Codeine Itching and Rash  . Penicillins Itching    Has patient had a PCN reaction causing immediate rash, facial/tongue/throat swelling, SOB or lightheadedness with hypotension:  Unknown think it was rash Has patient had a PCN reaction causing severe rash involving mucus membranes or skin necrosis: unknown childhood Has patient had a PCN reaction that required hospitalization: unknown Has patient had a PCN reaction occurring within the last 10 years: no childhood If all of the above answers  are "NO", then may proceed with Cephalosporin use.     Current Outpatient Medications on File Prior to Visit  Medication Sig Dispense Refill  . ALPRAZolam (XANAX) 0.5 MG tablet Take 1 tablet (0.5 mg total) by mouth 2 (two) times daily as needed. 60 tablet 2  . citalopram (CELEXA) 40 MG tablet Take 1 tablet (40 mg total) by mouth daily. 90 tablet 3  . estradiol (ESTRACE) 0.5 MG tablet Take 0.5 mg by mouth daily.    Marland Kitchen levothyroxine (SYNTHROID, LEVOTHROID) 25 MCG tablet TAKE 1 TABLET BY MOUTH ONCE DAILY  BEFORE BREAKFAST 90 tablet 3  . rosuvastatin (CRESTOR) 10 MG tablet Take 1 tablet (10 mg total) by mouth daily. 90 tablet 3  . sodium chloride (OCEAN) 0.65 % SOLN nasal spray Place 1 spray into both nostrils as needed for congestion. 15 mL 0   No current facility-administered medications on file prior to visit.    Review of Systems  Constitutional: Negative for other unusual diaphoresis or sweats HENT: Negative for ear discharge or swelling Eyes: Negative for other worsening visual disturbances Respiratory: Negative for stridor or other swelling  Gastrointestinal: Negative for worsening distension or other blood Genitourinary: Negative for retention or other urinary change Musculoskeletal: Negative for other MSK pain or swelling Skin: Negative for color change or other new lesions Neurological: Negative for worsening tremors and other numbness  Psychiatric/Behavioral: Negative for worsening agitation or other fatigue All other system neg per pt    Objective:   Physical Exam BP (!) 168/94   Pulse (!) 106   Temp 98 F (36.7 C) (Oral)   Ht 5\' 4"  (1.626 m)   Wt 134 lb (60.8 kg)   SpO2 96%   BMI 23.00 kg/m  VS noted,  Constitutional: Pt appears in NAD HENT: Head: NCAT.  Right Ear: External ear normal.  Left Ear: External ear normal.  Eyes: . Pupils are equal, round, and reactive to light. Conjunctivae and EOM are normal Nose: without d/c or deformity Neck: Neck supple. Gross normal ROM Cardiovascular: Normal rate and regular rhythm.   Pulmonary/Chest: Effort normal and breath sounds without rales or wheezing.  Abd:  Soft, NT, ND, + BS, no organomegaly Neurological: Pt is alert. At baseline orientation, motor grossly intact Skin: Skin is warm. No rashes, other new lesions, no LE edema Psychiatric: Pt behavior is normal without agitation  No other exam findings  Lab Results  Component Value Date   WBC 5.2 01/16/2019   HGB 13.7 01/16/2019   HCT 39.5 01/16/2019   PLT 323.0  01/16/2019   GLUCOSE 111 (H) 01/16/2019   CHOL 228 (H) 01/16/2019   TRIG 104.0 01/16/2019   HDL 77.40 01/16/2019   LDLDIRECT 161.4 06/13/2013   LDLCALC 130 (H) 01/16/2019   ALT 10 01/16/2019   AST 15 01/16/2019   NA 137 01/16/2019   K 4.6 01/16/2019   CL 101 01/16/2019   CREATININE 0.66 01/16/2019   BUN 19 01/16/2019   CO2 28 01/16/2019   TSH 3.72 01/16/2019   HGBA1C 5.8 01/16/2019        Assessment & Plan:

## 2019-07-06 ENCOUNTER — Encounter: Payer: Self-pay | Admitting: Internal Medicine

## 2019-07-06 DIAGNOSIS — E538 Deficiency of other specified B group vitamins: Secondary | ICD-10-CM | POA: Insufficient documentation

## 2019-07-06 DIAGNOSIS — R0683 Snoring: Secondary | ICD-10-CM | POA: Insufficient documentation

## 2019-07-06 DIAGNOSIS — I10 Essential (primary) hypertension: Secondary | ICD-10-CM | POA: Insufficient documentation

## 2019-07-06 NOTE — Assessment & Plan Note (Signed)
stable overall by history and exam, recent data reviewed with pt, and pt to continue medical treatment as before,  to f/u any worsening symptoms or concerns  

## 2019-07-06 NOTE — Assessment & Plan Note (Signed)
C/w tension type, for alleve prn,  to f/u any worsening symptoms or concerns

## 2019-07-06 NOTE — Assessment & Plan Note (Addendum)
For monthly B12 1000 mg IM today and monthly,  to f/u any worsening symptoms or concerns  Note:  Total time for pt hx, exam, review of record with pt in the room, determination of diagnoses and plan for further eval and tx is > 40 min, with over 50% spent in coordination and counseling of patient including the differential dx, tx, further evaluation and other management of b12 deficiency, hyperglycemia, HTN, snoring, anxiety, headache

## 2019-07-06 NOTE — Assessment & Plan Note (Signed)
I suspect possible OSA which could help explain the recent onset HTN, pt declines pulm referral

## 2019-07-06 NOTE — Assessment & Plan Note (Signed)
I suspect mild uncontrolled, but pt wary of any med change, to cont to monitor BP at home, and consider increased amlodipine

## 2019-07-06 NOTE — Assessment & Plan Note (Signed)
Pt wants to try wean off xanax, ok for xanax wean asd,  to f/u any worsening symptoms or concerns

## 2019-07-17 ENCOUNTER — Encounter: Payer: Self-pay | Admitting: Internal Medicine

## 2019-07-17 DIAGNOSIS — J3089 Other allergic rhinitis: Secondary | ICD-10-CM | POA: Diagnosis not present

## 2019-07-17 DIAGNOSIS — M79643 Pain in unspecified hand: Secondary | ICD-10-CM

## 2019-07-17 DIAGNOSIS — H1045 Other chronic allergic conjunctivitis: Secondary | ICD-10-CM | POA: Diagnosis not present

## 2019-07-31 DIAGNOSIS — R69 Illness, unspecified: Secondary | ICD-10-CM | POA: Diagnosis not present

## 2019-08-02 ENCOUNTER — Ambulatory Visit (INDEPENDENT_AMBULATORY_CARE_PROVIDER_SITE_OTHER): Payer: Medicare HMO

## 2019-08-02 ENCOUNTER — Other Ambulatory Visit: Payer: Self-pay

## 2019-08-02 DIAGNOSIS — E538 Deficiency of other specified B group vitamins: Secondary | ICD-10-CM | POA: Diagnosis not present

## 2019-08-02 MED ORDER — CYANOCOBALAMIN 1000 MCG/ML IJ SOLN
1000.0000 ug | Freq: Once | INTRAMUSCULAR | Status: AC
  Administered 2019-08-02: 17:00:00 1000 ug via INTRAMUSCULAR

## 2019-08-02 NOTE — Progress Notes (Signed)
Medical screening examination/treatment/procedure(s) were performed by non-physician practitioner and as supervising physician I was immediately available for consultation/collaboration. I agree with above. Detrell Umscheid, MD   

## 2019-08-16 ENCOUNTER — Other Ambulatory Visit: Payer: Self-pay

## 2019-08-16 DIAGNOSIS — Z20822 Contact with and (suspected) exposure to covid-19: Secondary | ICD-10-CM

## 2019-08-16 DIAGNOSIS — Z20828 Contact with and (suspected) exposure to other viral communicable diseases: Secondary | ICD-10-CM | POA: Diagnosis not present

## 2019-08-17 LAB — NOVEL CORONAVIRUS, NAA: SARS-CoV-2, NAA: NOT DETECTED

## 2019-08-27 ENCOUNTER — Other Ambulatory Visit: Payer: Self-pay | Admitting: Internal Medicine

## 2019-08-27 NOTE — Telephone Encounter (Signed)
Done erx 

## 2019-08-30 DIAGNOSIS — R69 Illness, unspecified: Secondary | ICD-10-CM | POA: Diagnosis not present

## 2019-08-31 ENCOUNTER — Ambulatory Visit (INDEPENDENT_AMBULATORY_CARE_PROVIDER_SITE_OTHER): Payer: Medicare HMO

## 2019-08-31 DIAGNOSIS — E538 Deficiency of other specified B group vitamins: Secondary | ICD-10-CM | POA: Diagnosis not present

## 2019-08-31 MED ORDER — CYANOCOBALAMIN 1000 MCG/ML IJ SOLN
1000.0000 ug | Freq: Once | INTRAMUSCULAR | Status: AC
  Administered 2019-08-31: 1000 ug via INTRAMUSCULAR

## 2019-08-31 NOTE — Addendum Note (Signed)
Addended by: Ander Slade on: 08/31/2019 04:31 PM   Modules accepted: Orders

## 2019-09-01 NOTE — Progress Notes (Signed)
Medical screening examination/treatment/procedure(s) were performed by non-physician practitioner and as supervising physician I was immediately available for consultation/collaboration. I agree with above. Aj Crunkleton, MD   

## 2019-09-04 DIAGNOSIS — M1812 Unilateral primary osteoarthritis of first carpometacarpal joint, left hand: Secondary | ICD-10-CM | POA: Insufficient documentation

## 2019-09-04 DIAGNOSIS — M13842 Other specified arthritis, left hand: Secondary | ICD-10-CM | POA: Diagnosis not present

## 2019-09-11 DIAGNOSIS — R69 Illness, unspecified: Secondary | ICD-10-CM | POA: Diagnosis not present

## 2019-10-03 ENCOUNTER — Ambulatory Visit: Payer: Medicare HMO

## 2019-10-17 ENCOUNTER — Ambulatory Visit (INDEPENDENT_AMBULATORY_CARE_PROVIDER_SITE_OTHER): Payer: Medicare HMO | Admitting: *Deleted

## 2019-10-17 ENCOUNTER — Other Ambulatory Visit: Payer: Self-pay

## 2019-10-17 ENCOUNTER — Telehealth: Payer: Self-pay | Admitting: *Deleted

## 2019-10-17 DIAGNOSIS — R739 Hyperglycemia, unspecified: Secondary | ICD-10-CM

## 2019-10-17 DIAGNOSIS — E538 Deficiency of other specified B group vitamins: Secondary | ICD-10-CM | POA: Diagnosis not present

## 2019-10-17 DIAGNOSIS — E785 Hyperlipidemia, unspecified: Secondary | ICD-10-CM

## 2019-10-17 MED ORDER — CYANOCOBALAMIN 1000 MCG/ML IJ SOLN
1000.0000 ug | Freq: Once | INTRAMUSCULAR | Status: AC
  Administered 2019-10-17: 14:00:00 1000 ug via INTRAMUSCULAR

## 2019-10-17 NOTE — Telephone Encounter (Signed)
Patient came in for nurse visit b 12 injection. She believes she was told she only needed these injections monthly for a short time.  Does she needs them indefinitely? She is scheduled to see you 01/14/20. Please advise.

## 2019-10-17 NOTE — Progress Notes (Signed)
Medical screening examination/treatment/procedure(s) were performed by non-physician practitioner and as supervising physician I was immediately available for consultation/collaboration. I agree with above. James John, MD   

## 2019-10-17 NOTE — Telephone Encounter (Signed)
Pt informed of below. Does she need labs prior to 01/14/20 OV?

## 2019-10-17 NOTE — Telephone Encounter (Signed)
Only for 6 mo

## 2019-10-18 ENCOUNTER — Encounter: Payer: Self-pay | Admitting: *Deleted

## 2019-10-18 NOTE — Telephone Encounter (Signed)
Pt informed of below via MyChart per her request.

## 2019-10-18 NOTE — Telephone Encounter (Signed)
Ok for labs  - ordered 

## 2019-11-13 ENCOUNTER — Ambulatory Visit: Payer: Medicare HMO | Attending: Internal Medicine

## 2019-11-13 DIAGNOSIS — Z20822 Contact with and (suspected) exposure to covid-19: Secondary | ICD-10-CM | POA: Diagnosis not present

## 2019-11-15 ENCOUNTER — Telehealth: Payer: Self-pay

## 2019-11-15 LAB — NOVEL CORONAVIRUS, NAA: SARS-CoV-2, NAA: DETECTED — AB

## 2019-11-15 NOTE — Telephone Encounter (Signed)
Patient returned [facility] call - DOB/Address verified - reviewed MyChart note sent to patient.  Patient reports feeling much better. Patient reports the following symptoms: cough, runny nose, nasal congestion; sore throat, diarrhea x 1 Yesterday(has resolved); vomiting.  Patient verbalized understanding of instructions - no further questions.

## 2019-11-16 ENCOUNTER — Encounter: Payer: Self-pay | Admitting: Internal Medicine

## 2019-11-16 ENCOUNTER — Ambulatory Visit: Payer: Medicare HMO

## 2019-11-23 ENCOUNTER — Ambulatory Visit: Payer: Medicare HMO | Attending: Internal Medicine

## 2019-11-23 DIAGNOSIS — Z20822 Contact with and (suspected) exposure to covid-19: Secondary | ICD-10-CM

## 2019-11-24 LAB — NOVEL CORONAVIRUS, NAA: SARS-CoV-2, NAA: DETECTED — AB

## 2019-11-26 ENCOUNTER — Encounter: Payer: Self-pay | Admitting: Internal Medicine

## 2019-11-30 DIAGNOSIS — Z8616 Personal history of COVID-19: Secondary | ICD-10-CM | POA: Diagnosis not present

## 2019-11-30 DIAGNOSIS — Z09 Encounter for follow-up examination after completed treatment for conditions other than malignant neoplasm: Secondary | ICD-10-CM | POA: Diagnosis not present

## 2019-12-17 ENCOUNTER — Ambulatory Visit (INDEPENDENT_AMBULATORY_CARE_PROVIDER_SITE_OTHER): Payer: Medicare HMO | Admitting: *Deleted

## 2019-12-17 ENCOUNTER — Ambulatory Visit: Payer: Medicare HMO

## 2019-12-17 ENCOUNTER — Other Ambulatory Visit: Payer: Self-pay

## 2019-12-17 DIAGNOSIS — E538 Deficiency of other specified B group vitamins: Secondary | ICD-10-CM | POA: Diagnosis not present

## 2019-12-17 MED ORDER — CYANOCOBALAMIN 1000 MCG/ML IJ SOLN
1000.0000 ug | Freq: Once | INTRAMUSCULAR | Status: AC
  Administered 2019-12-17: 1000 ug via INTRAMUSCULAR

## 2019-12-17 NOTE — Progress Notes (Signed)
Medical screening examination/treatment/procedure(s) were performed by non-physician practitioner and as supervising physician I was immediately available for consultation/collaboration. I agree with above. Stepfanie Yott, MD   

## 2019-12-28 ENCOUNTER — Encounter: Payer: Self-pay | Admitting: Internal Medicine

## 2020-01-01 DIAGNOSIS — H401211 Low-tension glaucoma, right eye, mild stage: Secondary | ICD-10-CM | POA: Diagnosis not present

## 2020-01-01 DIAGNOSIS — H04123 Dry eye syndrome of bilateral lacrimal glands: Secondary | ICD-10-CM | POA: Diagnosis not present

## 2020-01-01 DIAGNOSIS — H401222 Low-tension glaucoma, left eye, moderate stage: Secondary | ICD-10-CM | POA: Diagnosis not present

## 2020-01-14 ENCOUNTER — Ambulatory Visit (INDEPENDENT_AMBULATORY_CARE_PROVIDER_SITE_OTHER): Payer: Medicare HMO | Admitting: Internal Medicine

## 2020-01-14 ENCOUNTER — Encounter: Payer: Self-pay | Admitting: Internal Medicine

## 2020-01-14 ENCOUNTER — Other Ambulatory Visit: Payer: Self-pay | Admitting: *Deleted

## 2020-01-14 ENCOUNTER — Ambulatory Visit: Payer: Medicare HMO | Attending: Internal Medicine

## 2020-01-14 ENCOUNTER — Other Ambulatory Visit: Payer: Self-pay

## 2020-01-14 VITALS — BP 142/80 | HR 104 | Temp 98.3°F | Ht 64.0 in | Wt 143.0 lb

## 2020-01-14 DIAGNOSIS — E559 Vitamin D deficiency, unspecified: Secondary | ICD-10-CM

## 2020-01-14 DIAGNOSIS — E7800 Pure hypercholesterolemia, unspecified: Secondary | ICD-10-CM

## 2020-01-14 DIAGNOSIS — R7302 Impaired glucose tolerance (oral): Secondary | ICD-10-CM

## 2020-01-14 DIAGNOSIS — Z0000 Encounter for general adult medical examination without abnormal findings: Secondary | ICD-10-CM | POA: Diagnosis not present

## 2020-01-14 DIAGNOSIS — E538 Deficiency of other specified B group vitamins: Secondary | ICD-10-CM | POA: Diagnosis not present

## 2020-01-14 DIAGNOSIS — R918 Other nonspecific abnormal finding of lung field: Secondary | ICD-10-CM | POA: Diagnosis not present

## 2020-01-14 DIAGNOSIS — Z23 Encounter for immunization: Secondary | ICD-10-CM | POA: Insufficient documentation

## 2020-01-14 DIAGNOSIS — H401290 Low-tension glaucoma, unspecified eye, stage unspecified: Secondary | ICD-10-CM | POA: Insufficient documentation

## 2020-01-14 LAB — CBC WITH DIFFERENTIAL/PLATELET
Basophils Absolute: 0.1 10*3/uL (ref 0.0–0.1)
Basophils Relative: 1.2 % (ref 0.0–3.0)
Eosinophils Absolute: 0.3 10*3/uL (ref 0.0–0.7)
Eosinophils Relative: 4.7 % (ref 0.0–5.0)
HCT: 38.8 % (ref 36.0–46.0)
Hemoglobin: 13.2 g/dL (ref 12.0–15.0)
Lymphocytes Relative: 23.3 % (ref 12.0–46.0)
Lymphs Abs: 1.7 10*3/uL (ref 0.7–4.0)
MCHC: 34 g/dL (ref 30.0–36.0)
MCV: 100.8 fl — ABNORMAL HIGH (ref 78.0–100.0)
Monocytes Absolute: 0.6 10*3/uL (ref 0.1–1.0)
Monocytes Relative: 7.7 % (ref 3.0–12.0)
Neutro Abs: 4.5 10*3/uL (ref 1.4–7.7)
Neutrophils Relative %: 63.1 % (ref 43.0–77.0)
Platelets: 334 10*3/uL (ref 150.0–400.0)
RBC: 3.85 Mil/uL — ABNORMAL LOW (ref 3.87–5.11)
RDW: 14.2 % (ref 11.5–15.5)
WBC: 7.2 10*3/uL (ref 4.0–10.5)

## 2020-01-14 LAB — LIPID PANEL
Cholesterol: 247 mg/dL — ABNORMAL HIGH (ref 0–200)
HDL: 70 mg/dL (ref >39.00)
NonHDL: 177.22
Total CHOL/HDL Ratio: 4
Triglycerides: 248 mg/dL — ABNORMAL HIGH (ref 0.0–149.0)
VLDL: 49.6 mg/dL — ABNORMAL HIGH (ref 0.0–40.0)

## 2020-01-14 LAB — HEPATIC FUNCTION PANEL
ALT: 14 U/L (ref 0–35)
AST: 18 U/L (ref 0–37)
Albumin: 4.2 g/dL (ref 3.5–5.2)
Alkaline Phosphatase: 65 U/L (ref 39–117)
Bilirubin, Direct: 0 mg/dL (ref 0.0–0.3)
Total Bilirubin: 0.2 mg/dL (ref 0.2–1.2)
Total Protein: 7.5 g/dL (ref 6.0–8.3)

## 2020-01-14 LAB — BASIC METABOLIC PANEL
BUN: 16 mg/dL (ref 6–23)
CO2: 29 mEq/L (ref 19–32)
Calcium: 9.5 mg/dL (ref 8.4–10.5)
Chloride: 101 mEq/L (ref 96–112)
Creatinine, Ser: 0.6 mg/dL (ref 0.40–1.20)
GFR: 98.44 mL/min (ref >60.00)
Glucose, Bld: 91 mg/dL (ref 70–99)
Potassium: 4 mEq/L (ref 3.5–5.1)
Sodium: 137 mEq/L (ref 135–145)

## 2020-01-14 LAB — HEMOGLOBIN A1C: Hgb A1c MFr Bld: 5.6 % (ref 4.6–6.5)

## 2020-01-14 LAB — TSH: TSH: 3.49 u[IU]/mL (ref 0.35–4.50)

## 2020-01-14 LAB — LDL CHOLESTEROL, DIRECT: Direct LDL: 142 mg/dL

## 2020-01-14 MED ORDER — CYANOCOBALAMIN 1000 MCG/ML IJ SOLN
1000.0000 ug | Freq: Once | INTRAMUSCULAR | Status: AC
  Administered 2020-01-14: 1000 ug via INTRAMUSCULAR

## 2020-01-14 NOTE — Progress Notes (Signed)
   Covid-19 Vaccination Clinic  Name:  Cindy Higgins    MRN: [id number] DOB: 05/22/1946  01/14/2020  [name] was observed post Covid-19 immunization for 15 minutes without incident. She was provided with Vaccine Information Sheet and instruction to access the V-Safe system.   [name] was instructed to call 911 with any severe reactions post vaccine: Marland Kitchen Difficulty breathing  . Swelling of face and throat  . A fast heartbeat  . A bad rash all over body  . Dizziness and weakness   Immunizations Administered    Name Date Dose VIS Date Route   Pfizer COVID-19 Vaccine 01/14/2020  8:50 AM 0.3 mL 10/19/2019 Intramuscular   Manufacturer: [employer]   Lot: [id number]   [place]: [id number]

## 2020-01-14 NOTE — Assessment & Plan Note (Signed)
Cont oral replacement 

## 2020-01-14 NOTE — Progress Notes (Signed)
Subjective:    Patient ID: Cindy Higgins, female    DOB: Jun 03, 1946, 74 y.o.   MRN: [id number]  HPI  Here for wellness and f/u;  Overall doing ok;  Pt denies Chest pain, worsening SOB, DOE, wheezing, orthopnea, PND, worsening LE edema, palpitations, dizziness or syncope.  Pt denies neurological change such as new headache, facial or extremity weakness.  Pt denies polydipsia, polyuria, or low sugar symptoms. Pt states overall good compliance with treatment and medications, good tolerability, and has been trying to follow appropriate diet.  Pt denies worsening depressive symptoms, suicidal ideation or panic. No fever, night sweats, wt loss, loss of appetite, or other constitutional symptoms.  Pt states good ability with ADL's, has low fall risk, home safety reviewed and adequate, no other significant changes in hearing or vision, and only occasionally active with exercise. S/p covid vaccine x 1 today. Due for B12. Saw a new optho recently and has glaucoma bilateral (unusual "non tension" type), and did have a trip and fall x 1 with cloudy vision. Also with cataracts but not ready for surgury yet. Has been isolated at home with husband for the most part for a yr now.  Had covid infection about xmas 2020 after son exposure.  BP at home < 140/90.  Taking the Vit d3 5000 u per day. 2 sister with breast ca, gma as well. Does not want statin tx, will cont to work on diet. Past Medical History:  Diagnosis Date  . ALLERGIC RHINITIS 12/22/2007  . Anxiety   . ANXIETY DEPRESSION 08/30/2007  . Arthritis    Left thumb bone on bone  . BLURRED VISION, INTERMITTENT 12/22/2007  . COLONIC POLYPS, HX OF 12/22/2007   01/18/2006  . Glaucoma    NTG Glaucoma  . GLUCOSE INTOLERANCE 07/08/2009  . Headache(784.0) 12/22/2007  . Herpes zoster of eye 05/10/2011  . HYPERCHOLESTEROLEMIA 08/30/2007  . HYPERLIPIDEMIA 03/26/2008  . Impaired glucose tolerance 05/09/2011  . MITRAL VALVE PROLAPSE, HX OF 08/30/2007  . OSTEOPOROSIS  12/22/2007  . POSTMENOPAUSAL STATUS 08/30/2007  . SINUSITIS- ACUTE-NOS 02/06/2010   Past Surgical History:  Procedure Laterality Date  . ABDOMINAL HYSTERECTOMY    . COLONOSCOPY    . COSMETIC SURGERY    . DILATION AND CURETTAGE OF UTERUS  2007  . OOPHORECTOMY    . POLYPECTOMY      reports that she has never smoked. She has never used smokeless tobacco. She reports current alcohol use of about 2.0 standard drinks of alcohol per week. She reports that she does not use drugs. family history includes Alcohol abuse in her father; Breast cancer in her maternal grandmother and sister; Cancer in her sister; Diabetes in her mother; Heart disease in her mother; Hyperlipidemia in her mother; Testicular cancer in her son. Allergies  Allergen Reactions  . Sulfa Antibiotics Anaphylaxis  . Scallops [Shellfish Allergy] Nausea And Vomiting  . Codeine Itching and Rash  . Penicillins Itching    Has patient had a PCN reaction causing immediate rash, facial/tongue/throat swelling, SOB or lightheadedness with hypotension:  Unknown think it was rash Has patient had a PCN reaction causing severe rash involving mucus membranes or skin necrosis: unknown childhood Has patient had a PCN reaction that required hospitalization: unknown Has patient had a PCN reaction occurring within the last 10 years: no childhood If all of the above answers are "NO", then may proceed with Cephalosporin use.     Current Outpatient Medications on File Prior to Visit  Medication Sig Dispense Refill  .  ALPRAZolam (XANAX) 0.5 MG tablet TAKE 1 TABLET (0.5 MG TOTAL) BY MOUTH 2 (TWO) TIMES DAILY AS NEEDED. 60 tablet 2  . amLODipine (NORVASC) 2.5 MG tablet Take 1 tablet (2.5 mg total) by mouth daily. 90 tablet 3  . calcium-vitamin D (OSCAL WITH D) 250-125 MG-UNIT tablet Take 1 tablet by mouth daily.    . citalopram (CELEXA) 40 MG tablet Take 1 tablet (40 mg total) by mouth daily. 90 tablet 3  . estradiol (ESTRACE) 0.5 MG tablet Take 0.5 mg  by mouth daily.    . fluorometholone (FML) 0.1 % ophthalmic suspension 1 drop every 4 (four) hours.    Marland Kitchen latanoprost (XALATAN) 0.005 % ophthalmic solution 1 drop at bedtime.    Marland Kitchen levothyroxine (SYNTHROID, LEVOTHROID) 25 MCG tablet TAKE 1 TABLET BY MOUTH ONCE DAILY BEFORE BREAKFAST 90 tablet 3   No current facility-administered medications on file prior to visit.   Review of Systems All otherwise neg per pt     Objective:   Physical Exam BP (!) 142/80   Pulse (!) 104   Temp 98.3 F (36.8 C)   Ht 5\' 4"  (1.626 m)   Wt 143 lb (64.9 kg)   SpO2 97%   BMI 24.55 kg/m  VS noted,  Constitutional: Pt appears in NAD HENT: Head: NCAT.  Right Ear: External ear normal.  Left Ear: External ear normal.  Eyes: . Pupils are equal, round, and reactive to light. Conjunctivae and EOM are normal Nose: without d/c or deformity Neck: Neck supple. Gross normal ROM Cardiovascular: Normal rate and regular rhythm.   Pulmonary/Chest: Effort normal and breath sounds without rales or wheezing.  Abd:  Soft, NT, ND, + BS, no organomegaly Neurological: Pt is alert. At baseline orientation, motor grossly intact Skin: Skin is warm. No rashes, other new lesions, no LE edema Psychiatric: Pt behavior is normal without agitation \ All otherwise neg per pt Lab Results  Component Value Date   WBC 5.2 01/16/2019   HGB 13.7 01/16/2019   HCT 39.5 01/16/2019   PLT 323.0 01/16/2019   GLUCOSE 111 (H) 01/16/2019   CHOL 228 (H) 01/16/2019   TRIG 104.0 01/16/2019   HDL 77.40 01/16/2019   LDLDIRECT 161.4 06/13/2013   LDLCALC 130 (H) 01/16/2019   ALT 10 01/16/2019   AST 15 01/16/2019   NA 137 01/16/2019   K 4.6 01/16/2019   CL 101 01/16/2019   CREATININE 0.66 01/16/2019   BUN 19 01/16/2019   CO2 28 01/16/2019   TSH 3.72 01/16/2019   HGBA1C 5.8 01/16/2019          Assessment & Plan:

## 2020-01-14 NOTE — Patient Instructions (Signed)
You had the B12 shot today  Ok to then change to the B12 pills (done in prescription today)  You will be contacted regarding the referral for: CT scan for the chest  Please continue all other medications as before, and refills have been done if requested.  Please have the pharmacy call with any other refills you may need.  Please continue your efforts at being more active, low cholesterol diet, and weight control.  You are otherwise up to date with prevention measures today.  Please keep your appointments with your specialists as you may have planned  Please go to the LAB at the blood drawing area for the tests to be done  You will be contacted by phone if any changes need to be made immediately.  Otherwise, you will receive a letter about your results with an explanation, but please check with MyChart first.  Please remember to sign up for MyChart if you have not done so, as this will be important to you in the future with finding out test results, communicating by private email, and scheduling acute appointments online when needed.  Please make an Appointment to return in 6 months, or sooner if needed

## 2020-01-14 NOTE — Assessment & Plan Note (Signed)

## 2020-01-14 NOTE — Assessment & Plan Note (Signed)
For b12 1000 mcg IM today, then oral daily

## 2020-01-14 NOTE — Assessment & Plan Note (Signed)
Declines statin 

## 2020-01-15 LAB — URINALYSIS, ROUTINE W REFLEX MICROSCOPIC
Bilirubin Urine: NEGATIVE
Hgb urine dipstick: NEGATIVE
Ketones, ur: NEGATIVE
Leukocytes,Ua: NEGATIVE
Nitrite: NEGATIVE
RBC / HPF: NONE SEEN (ref 0–?)
Specific Gravity, Urine: 1.01 (ref 1.000–1.030)
Total Protein, Urine: NEGATIVE
Urine Glucose: NEGATIVE
Urobilinogen, UA: 0.2 (ref 0.0–1.0)
pH: 6 (ref 5.0–8.0)

## 2020-01-24 ENCOUNTER — Ambulatory Visit
Admission: RE | Admit: 2020-01-24 | Discharge: 2020-01-24 | Disposition: A | Discharge status: Home / Self Care | Payer: Medicare HMO | Source: Ambulatory Visit | Attending: Internal Medicine | Admitting: Internal Medicine

## 2020-01-24 ENCOUNTER — Other Ambulatory Visit: Payer: Self-pay

## 2020-01-24 DIAGNOSIS — R918 Other nonspecific abnormal finding of lung field: Secondary | ICD-10-CM

## 2020-02-07 DIAGNOSIS — R69 Illness, unspecified: Secondary | ICD-10-CM | POA: Diagnosis not present

## 2020-02-12 DIAGNOSIS — H04123 Dry eye syndrome of bilateral lacrimal glands: Secondary | ICD-10-CM | POA: Diagnosis not present

## 2020-02-12 DIAGNOSIS — H401222 Low-tension glaucoma, left eye, moderate stage: Secondary | ICD-10-CM | POA: Diagnosis not present

## 2020-02-12 DIAGNOSIS — H401211 Low-tension glaucoma, right eye, mild stage: Secondary | ICD-10-CM | POA: Diagnosis not present

## 2020-02-12 DIAGNOSIS — H2513 Age-related nuclear cataract, bilateral: Secondary | ICD-10-CM | POA: Diagnosis not present

## 2020-02-13 ENCOUNTER — Ambulatory Visit: Payer: Medicare HMO | Attending: Internal Medicine

## 2020-02-13 DIAGNOSIS — Z23 Encounter for immunization: Secondary | ICD-10-CM

## 2020-02-13 NOTE — Progress Notes (Signed)
   Covid-19 Vaccination Clinic  Name:  Cindy Higgins    MRN: [id number] DOB: 1946/07/05  02/13/2020  [name] was observed post Covid-19 immunization for 15 minutes without incident. She was provided with Vaccine Information Sheet and instruction to access the V-Safe system.   [name] was instructed to call 911 with any severe reactions post vaccine: Marland Kitchen Difficulty breathing  . Swelling of face and throat  . A fast heartbeat  . A bad rash all over body  . Dizziness and weakness   Immunizations Administered    Name Date Dose VIS Date Route   Pfizer COVID-19 Vaccine 02/13/2020 10:23 AM 0.3 mL 10/19/2019 Intramuscular   Manufacturer: [employer]   Lot: [id number]   [place]: [id number]

## 2020-02-15 ENCOUNTER — Other Ambulatory Visit: Payer: Self-pay | Admitting: Internal Medicine

## 2020-02-25 DIAGNOSIS — R69 Illness, unspecified: Secondary | ICD-10-CM | POA: Diagnosis not present

## 2020-03-10 DIAGNOSIS — L718 Other rosacea: Secondary | ICD-10-CM | POA: Diagnosis not present

## 2020-03-10 DIAGNOSIS — L821 Other seborrheic keratosis: Secondary | ICD-10-CM | POA: Diagnosis not present

## 2020-03-10 DIAGNOSIS — L218 Other seborrheic dermatitis: Secondary | ICD-10-CM | POA: Diagnosis not present

## 2020-03-10 DIAGNOSIS — L2089 Other atopic dermatitis: Secondary | ICD-10-CM | POA: Diagnosis not present

## 2020-03-10 DIAGNOSIS — L918 Other hypertrophic disorders of the skin: Secondary | ICD-10-CM | POA: Diagnosis not present

## 2020-03-10 DIAGNOSIS — L570 Actinic keratosis: Secondary | ICD-10-CM | POA: Diagnosis not present

## 2020-03-10 DIAGNOSIS — L820 Inflamed seborrheic keratosis: Secondary | ICD-10-CM | POA: Diagnosis not present

## 2020-03-14 DIAGNOSIS — H401211 Low-tension glaucoma, right eye, mild stage: Secondary | ICD-10-CM | POA: Diagnosis not present

## 2020-03-14 DIAGNOSIS — H2513 Age-related nuclear cataract, bilateral: Secondary | ICD-10-CM | POA: Diagnosis not present

## 2020-03-14 DIAGNOSIS — H25013 Cortical age-related cataract, bilateral: Secondary | ICD-10-CM | POA: Diagnosis not present

## 2020-03-14 DIAGNOSIS — H401222 Low-tension glaucoma, left eye, moderate stage: Secondary | ICD-10-CM | POA: Diagnosis not present

## 2020-03-14 DIAGNOSIS — H2511 Age-related nuclear cataract, right eye: Secondary | ICD-10-CM | POA: Diagnosis not present

## 2020-03-27 ENCOUNTER — Other Ambulatory Visit: Payer: Self-pay | Admitting: Internal Medicine

## 2020-03-27 MED ORDER — ALPRAZOLAM 0.5 MG PO TABS: 0.5000 mg | ORAL_TABLET | Freq: Two times a day (BID) | ORAL | 2 refills | Status: DC | PRN

## 2020-03-27 NOTE — Telephone Encounter (Signed)
Done erx 

## 2020-04-16 DIAGNOSIS — H25811 Combined forms of age-related cataract, right eye: Secondary | ICD-10-CM | POA: Diagnosis not present

## 2020-04-16 DIAGNOSIS — H52201 Unspecified astigmatism, right eye: Secondary | ICD-10-CM | POA: Diagnosis not present

## 2020-04-16 DIAGNOSIS — H401211 Low-tension glaucoma, right eye, mild stage: Secondary | ICD-10-CM | POA: Diagnosis not present

## 2020-04-16 DIAGNOSIS — H2511 Age-related nuclear cataract, right eye: Secondary | ICD-10-CM | POA: Diagnosis not present

## 2020-04-28 DIAGNOSIS — H2512 Age-related nuclear cataract, left eye: Secondary | ICD-10-CM | POA: Diagnosis not present

## 2020-04-28 DIAGNOSIS — H25012 Cortical age-related cataract, left eye: Secondary | ICD-10-CM | POA: Diagnosis not present

## 2020-05-14 DIAGNOSIS — H2512 Age-related nuclear cataract, left eye: Secondary | ICD-10-CM | POA: Diagnosis not present

## 2020-05-14 DIAGNOSIS — H401222 Low-tension glaucoma, left eye, moderate stage: Secondary | ICD-10-CM | POA: Diagnosis not present

## 2020-05-14 DIAGNOSIS — H401122 Primary open-angle glaucoma, left eye, moderate stage: Secondary | ICD-10-CM | POA: Diagnosis not present

## 2020-05-14 DIAGNOSIS — H25012 Cortical age-related cataract, left eye: Secondary | ICD-10-CM | POA: Diagnosis not present

## 2020-05-14 DIAGNOSIS — H25812 Combined forms of age-related cataract, left eye: Secondary | ICD-10-CM | POA: Diagnosis not present

## 2020-05-14 DIAGNOSIS — H52222 Regular astigmatism, left eye: Secondary | ICD-10-CM | POA: Diagnosis not present

## 2020-05-20 ENCOUNTER — Other Ambulatory Visit: Payer: Self-pay | Admitting: Obstetrics and Gynecology

## 2020-05-20 DIAGNOSIS — Z1231 Encounter for screening mammogram for malignant neoplasm of breast: Secondary | ICD-10-CM

## 2020-05-29 DIAGNOSIS — Z01419 Encounter for gynecological examination (general) (routine) without abnormal findings: Secondary | ICD-10-CM | POA: Diagnosis not present

## 2020-06-12 DIAGNOSIS — L2089 Other atopic dermatitis: Secondary | ICD-10-CM | POA: Diagnosis not present

## 2020-06-12 DIAGNOSIS — L570 Actinic keratosis: Secondary | ICD-10-CM | POA: Diagnosis not present

## 2020-06-12 DIAGNOSIS — L718 Other rosacea: Secondary | ICD-10-CM | POA: Diagnosis not present

## 2020-06-13 ENCOUNTER — Ambulatory Visit
Admission: RE | Admit: 2020-06-13 | Discharge: 2020-06-13 | Disposition: A | Discharge status: Home / Self Care | Payer: Medicare HMO | Source: Ambulatory Visit | Attending: Obstetrics and Gynecology | Admitting: Obstetrics and Gynecology

## 2020-06-13 ENCOUNTER — Other Ambulatory Visit: Payer: Self-pay

## 2020-06-13 DIAGNOSIS — Z1231 Encounter for screening mammogram for malignant neoplasm of breast: Secondary | ICD-10-CM | POA: Diagnosis not present

## 2020-07-03 DIAGNOSIS — Z8249 Family history of ischemic heart disease and other diseases of the circulatory system: Secondary | ICD-10-CM | POA: Diagnosis not present

## 2020-07-03 DIAGNOSIS — R69 Illness, unspecified: Secondary | ICD-10-CM | POA: Diagnosis not present

## 2020-07-03 DIAGNOSIS — E039 Hypothyroidism, unspecified: Secondary | ICD-10-CM | POA: Diagnosis not present

## 2020-07-03 DIAGNOSIS — Z803 Family history of malignant neoplasm of breast: Secondary | ICD-10-CM | POA: Diagnosis not present

## 2020-07-03 DIAGNOSIS — R030 Elevated blood-pressure reading, without diagnosis of hypertension: Secondary | ICD-10-CM | POA: Diagnosis not present

## 2020-07-03 DIAGNOSIS — R32 Unspecified urinary incontinence: Secondary | ICD-10-CM | POA: Diagnosis not present

## 2020-07-03 DIAGNOSIS — H04129 Dry eye syndrome of unspecified lacrimal gland: Secondary | ICD-10-CM | POA: Diagnosis not present

## 2020-07-03 DIAGNOSIS — Z823 Family history of stroke: Secondary | ICD-10-CM | POA: Diagnosis not present

## 2020-07-03 DIAGNOSIS — Z79890 Hormone replacement therapy (postmenopausal): Secondary | ICD-10-CM | POA: Diagnosis not present

## 2020-07-10 ENCOUNTER — Other Ambulatory Visit: Payer: Self-pay | Admitting: Obstetrics and Gynecology

## 2020-07-10 DIAGNOSIS — N9089 Other specified noninflammatory disorders of vulva and perineum: Secondary | ICD-10-CM | POA: Diagnosis not present

## 2020-07-16 ENCOUNTER — Ambulatory Visit: Payer: Medicare HMO | Admitting: Internal Medicine

## 2020-07-16 DIAGNOSIS — Z0289 Encounter for other administrative examinations: Secondary | ICD-10-CM

## 2020-07-21 DIAGNOSIS — J3089 Other allergic rhinitis: Secondary | ICD-10-CM | POA: Diagnosis not present

## 2020-07-21 DIAGNOSIS — H1045 Other chronic allergic conjunctivitis: Secondary | ICD-10-CM | POA: Diagnosis not present

## 2020-07-23 DIAGNOSIS — L900 Lichen sclerosus et atrophicus: Secondary | ICD-10-CM | POA: Diagnosis not present

## 2020-07-24 ENCOUNTER — Ambulatory Visit (INDEPENDENT_AMBULATORY_CARE_PROVIDER_SITE_OTHER): Payer: Medicare HMO | Admitting: Internal Medicine

## 2020-07-24 ENCOUNTER — Other Ambulatory Visit: Payer: Self-pay

## 2020-07-24 ENCOUNTER — Encounter: Payer: Self-pay | Admitting: Internal Medicine

## 2020-07-24 VITALS — BP 136/84 | HR 85 | Temp 98.0°F | Ht 64.0 in | Wt 141.0 lb

## 2020-07-24 DIAGNOSIS — Z23 Encounter for immunization: Secondary | ICD-10-CM | POA: Diagnosis not present

## 2020-07-24 DIAGNOSIS — E538 Deficiency of other specified B group vitamins: Secondary | ICD-10-CM

## 2020-07-24 DIAGNOSIS — E7800 Pure hypercholesterolemia, unspecified: Secondary | ICD-10-CM

## 2020-07-24 DIAGNOSIS — R7302 Impaired glucose tolerance (oral): Secondary | ICD-10-CM

## 2020-07-24 DIAGNOSIS — E039 Hypothyroidism, unspecified: Secondary | ICD-10-CM | POA: Insufficient documentation

## 2020-07-24 DIAGNOSIS — E559 Vitamin D deficiency, unspecified: Secondary | ICD-10-CM

## 2020-07-24 DIAGNOSIS — I10 Essential (primary) hypertension: Secondary | ICD-10-CM

## 2020-07-24 HISTORY — DX: Hypothyroidism, unspecified: E03.9

## 2020-07-24 MED ORDER — TRAVOPROST (BAK FREE) 0.004 % OP SOLN: 1.0000 [drp] | Freq: Every day | OPHTHALMIC | 11 refills | Status: DC

## 2020-07-24 MED ORDER — RESTASIS 0.05 % OP EMUL: 1.0000 [drp] | Freq: Two times a day (BID) | OPHTHALMIC | 11 refills | Status: DC

## 2020-07-24 NOTE — Assessment & Plan Note (Signed)
stable overall by history and exam, recent data reviewed with pt, and pt to continue medical treatment as before,  to f/u any worsening symptoms or concerns  

## 2020-07-24 NOTE — Assessment & Plan Note (Addendum)
stable overall by history and exam, recent data reviewed with pt, and pt to continue medical treatment as before,  to f/u any worsening symptoms or concerns  I spent 31 minutes in preparing to see the patient by review of recent labs, imaging and procedures, obtaining and reviewing separately obtained history, communicating with the patient and family or caregiver, ordering medications, tests or procedures, and documenting clinical information in the EHR including the differential Dx, treatment, and any further evaluation and other management of b12 def, htn, hld, hyperglyemia, vit d def

## 2020-07-24 NOTE — Assessment & Plan Note (Signed)
stable overall by history and exam, recent data reviewed with pt, and pt to continue medical treatment as before,  to f/u any worsening symptoms or concerns, for f/u tft's

## 2020-07-24 NOTE — Assessment & Plan Note (Signed)
Cont replacement 

## 2020-07-24 NOTE — Patient Instructions (Addendum)
You had the Tdap tetanus shot and the flu shot today  Please continue all other medications as before, and refills have been done if requested.  Please have the pharmacy call with any other refills you may need.  Please continue your efforts at being more active, low cholesterol diet, and weight control.  Please keep your appointments with your specialists as you may have planned  Please go to the LAB at the blood drawing area for the tests to be done  You will be contacted by phone if any changes need to be made immediately.  Otherwise, you will receive a letter about your results with an explanation, but please check with MyChart first.  Please remember to sign up for MyChart if you have not done so, as this will be important to you in the future with finding out test results, communicating by private email, and scheduling acute appointments online when needed.  Please make an Appointment to return in 6 months, or sooner if needed

## 2020-07-24 NOTE — Assessment & Plan Note (Signed)
Cont oral replacement 

## 2020-07-24 NOTE — Progress Notes (Addendum)
Subjective:    Patient ID: Cindy Higgins, female    DOB: 1946/01/16, 74 y.o.   MRN: [id number]  HPI  Here to f/u; overall doing ok,  Pt denies chest pain, increasing sob or doe, wheezing, orthopnea, PND, increased LE swelling, palpitations, dizziness or syncope.  Pt denies new neurological symptoms such as new headache, or facial or extremity weakness or numbness.  Pt denies polydipsia, polyuria, or low sugar episode.  Pt states overall good compliance with meds, mostly trying to follow appropriate diet, with wt overall stable,  but little exercise however.  Denies hyper or hypo thyroid symptoms such as voice, skin or hair change.  S/p cataract surgury, also has glaucoma.   Past Medical History:  Diagnosis Date  . ALLERGIC RHINITIS 12/22/2007  . Anxiety   . ANXIETY DEPRESSION 08/30/2007  . Arthritis    Left thumb bone on bone  . BLURRED VISION, INTERMITTENT 12/22/2007  . COLONIC POLYPS, HX OF 12/22/2007   01/18/2006  . Glaucoma    NTG Glaucoma  . GLUCOSE INTOLERANCE 07/08/2009  . Headache(784.0) 12/22/2007  . Herpes zoster of eye 05/10/2011  . HYPERCHOLESTEROLEMIA 08/30/2007  . HYPERLIPIDEMIA 03/26/2008  . Impaired glucose tolerance 05/09/2011  . MITRAL VALVE PROLAPSE, HX OF 08/30/2007  . OSTEOPOROSIS 12/22/2007  . POSTMENOPAUSAL STATUS 08/30/2007  . SINUSITIS- ACUTE-NOS 02/06/2010   Past Surgical History:  Procedure Laterality Date  . ABDOMINAL HYSTERECTOMY    . AUGMENTATION MAMMAPLASTY    . COLONOSCOPY    . COSMETIC SURGERY    . DILATION AND CURETTAGE OF UTERUS  2007  . OOPHORECTOMY    . POLYPECTOMY      reports that she has never smoked. She has never used smokeless tobacco. She reports current alcohol use of about 2.0 standard drinks of alcohol per week. She reports that she does not use drugs. family history includes Alcohol abuse in her father; Breast cancer in her maternal grandmother and sister; Cancer in her sister; Diabetes in her mother; Heart disease in her mother;  Hyperlipidemia in her mother; Testicular cancer in her son. Allergies  Allergen Reactions  . Sulfa Antibiotics Anaphylaxis  . Scallops [Shellfish Allergy] Nausea And Vomiting  . Codeine Itching and Rash  . Penicillins Itching    Has patient had a PCN reaction causing immediate rash, facial/tongue/throat swelling, SOB or lightheadedness with hypotension:  Unknown think it was rash Has patient had a PCN reaction causing severe rash involving mucus membranes or skin necrosis: unknown childhood Has patient had a PCN reaction that required hospitalization: unknown Has patient had a PCN reaction occurring within the last 10 years: no childhood If all of the above answers are "NO", then may proceed with Cephalosporin use.     Current Outpatient Medications on File Prior to Visit  Medication Sig Dispense Refill  . ALPRAZolam (XANAX) 0.5 MG tablet Take 1 tablet (0.5 mg total) by mouth 2 (two) times daily as needed. 60 tablet 2  . calcium-vitamin D (OSCAL WITH D) 250-125 MG-UNIT tablet Take 1 tablet by mouth daily.    . citalopram (CELEXA) 40 MG tablet TAKE 1 TABLET BY MOUTH EVERY DAY 90 tablet 2  . estradiol (ESTRACE) 0.5 MG tablet Take 0.5 mg by mouth daily.    Marland Kitchen levothyroxine (SYNTHROID) 25 MCG tablet TAKE 1 TABLET BY MOUTH ONCE DAILY BEFORE BREAKFAST 90 tablet 2  . amLODipine (NORVASC) 2.5 MG tablet Take 1 tablet (2.5 mg total) by mouth daily. (Patient not taking: Reported on 07/24/2020) 90 tablet 3  .  fluorometholone (FML) 0.1 % ophthalmic suspension 1 drop every 4 (four) hours. (Patient not taking: Reported on 07/24/2020)    . latanoprost (XALATAN) 0.005 % ophthalmic solution 1 drop at bedtime. (Patient not taking: Reported on 07/24/2020)     No current facility-administered medications on file prior to visit.   Review of Systems All otherwise neg per pt    Objective:   Physical Exam BP 136/84   Pulse 85   Temp 98 F (36.7 C) (Oral)   Ht 5\' 4"  (1.626 m)   Wt 141 lb (64 kg)   SpO2 98%    BMI 24.20 kg/m  VS noted,  Constitutional: Pt appears in NAD HENT: Head: NCAT.  Right Ear: External ear normal.  Left Ear: External ear normal.  Eyes: . Pupils are equal, round, and reactive to light. Conjunctivae and EOM are normal Nose: without d/c or deformity Neck: Neck supple. Gross normal ROM Cardiovascular: Normal rate and regular rhythm.   Pulmonary/Chest: Effort normal and breath sounds without rales or wheezing.  Abd:  Soft, NT, ND, + BS, no organomegaly Neurological: Pt is alert. At baseline orientation, motor grossly intact Skin: Skin is warm. No rashes, other new lesions, no LE edema Psychiatric: Pt behavior is normal without agitation  All otherwise neg per pt  Lab Results  Component Value Date   WBC 7.2 01/14/2020   HGB 13.2 01/14/2020   HCT 38.8 01/14/2020   PLT 334.0 01/14/2020   GLUCOSE 91 01/14/2020   CHOL 247 (H) 01/14/2020   TRIG 248.0 (H) 01/14/2020   HDL 70.00 01/14/2020   LDLDIRECT 142.0 01/14/2020   LDLCALC 130 (H) 01/16/2019   ALT 14 01/14/2020   AST 18 01/14/2020   NA 137 01/14/2020   K 4.0 01/14/2020   CL 101 01/14/2020   CREATININE 0.60 01/14/2020   BUN 16 01/14/2020   CO2 29 01/14/2020   TSH 3.49 01/14/2020   HGBA1C 5.6 01/14/2020       Assessment & Plan:

## 2020-07-25 ENCOUNTER — Encounter: Payer: Self-pay | Admitting: Internal Medicine

## 2020-07-25 LAB — VITAMIN B12: Vitamin B-12: 423 pg/mL (ref 200–1100)

## 2020-07-25 LAB — T4, FREE: Free T4: 1.1 ng/dL (ref 0.8–1.8)

## 2020-07-25 LAB — VITAMIN D 25 HYDROXY (VIT D DEFICIENCY, FRACTURES): Vit D, 25-Hydroxy: 38 ng/mL (ref 30–100)

## 2020-07-25 LAB — TSH: TSH: 2.1 mIU/L (ref 0.40–4.50)

## 2020-09-11 DIAGNOSIS — H04123 Dry eye syndrome of bilateral lacrimal glands: Secondary | ICD-10-CM | POA: Diagnosis not present

## 2020-09-11 DIAGNOSIS — H35371 Puckering of macula, right eye: Secondary | ICD-10-CM | POA: Diagnosis not present

## 2020-09-11 DIAGNOSIS — H401211 Low-tension glaucoma, right eye, mild stage: Secondary | ICD-10-CM | POA: Diagnosis not present

## 2020-09-11 DIAGNOSIS — H401222 Low-tension glaucoma, left eye, moderate stage: Secondary | ICD-10-CM | POA: Diagnosis not present

## 2020-10-08 ENCOUNTER — Ambulatory Visit: Payer: Medicare HMO | Admitting: Internal Medicine

## 2020-10-09 ENCOUNTER — Other Ambulatory Visit: Payer: Self-pay | Admitting: Internal Medicine

## 2020-10-15 ENCOUNTER — Encounter: Payer: Self-pay | Admitting: Internal Medicine

## 2020-10-20 IMAGING — MG DIGITAL SCREENING BILATERAL MAMMOGRAM WITH IMPLANTS, CAD AND TOM
8 of 12 series · 8 of 28 positions shown · non-contrast
Comparison: Previous exam(s).

CLINICAL DATA: Screening.

EXAM:
DIGITAL SCREENING BILATERAL MAMMOGRAM WITH IMPLANTS, CAD AND TOMO
The patient has retropectoral implants. Standard and implant
displaced views were performed.

[L CC]
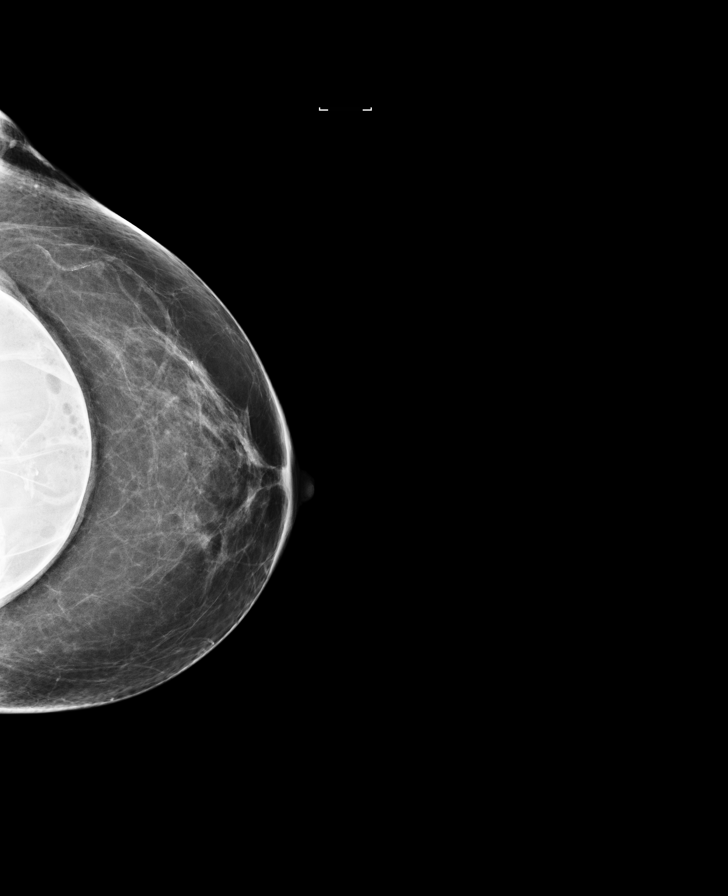

[R MLO]
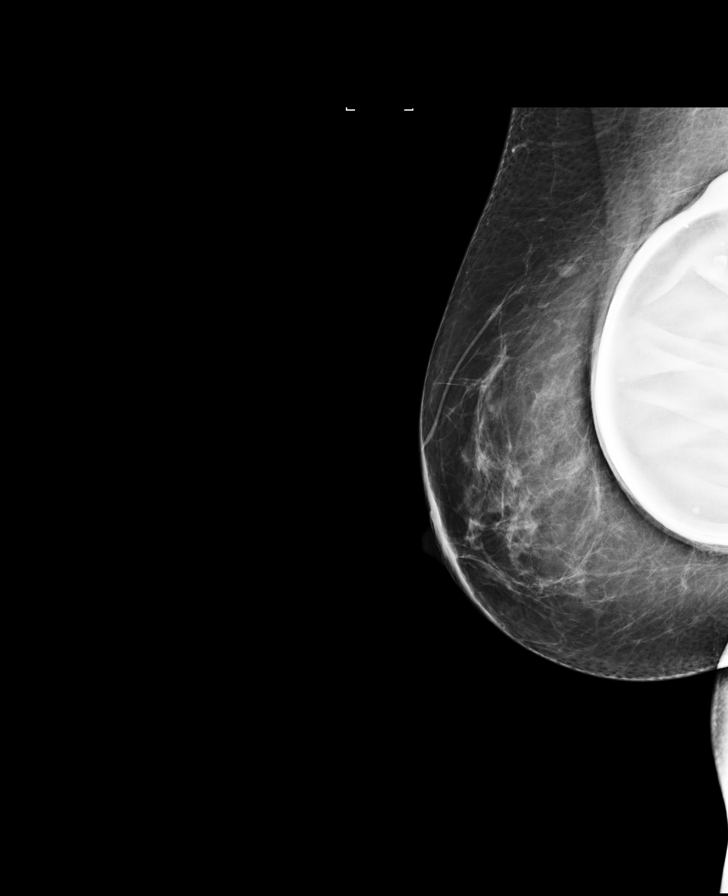

[R CC]
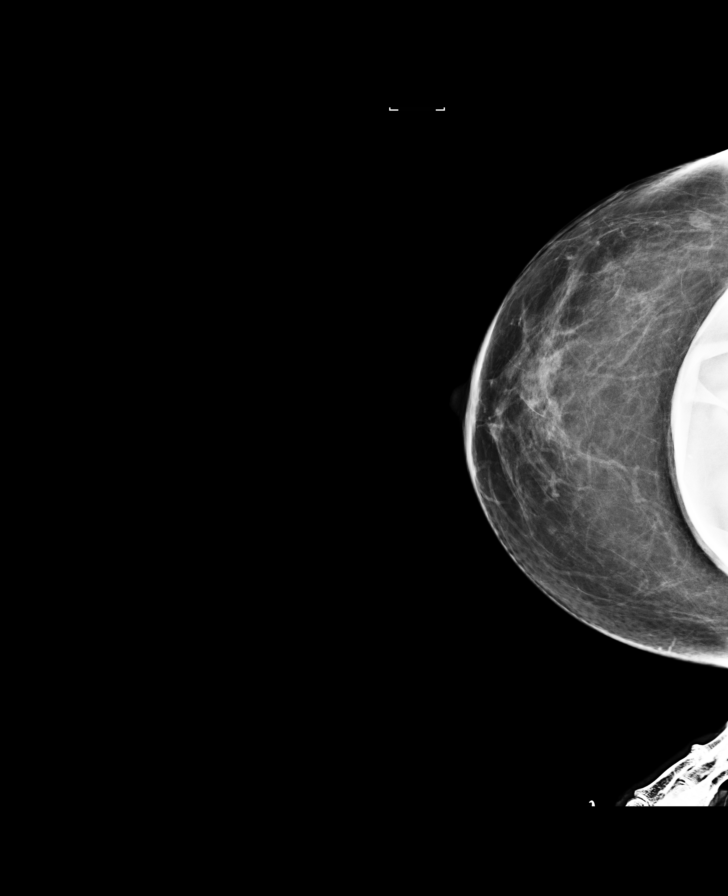

[L MLO]
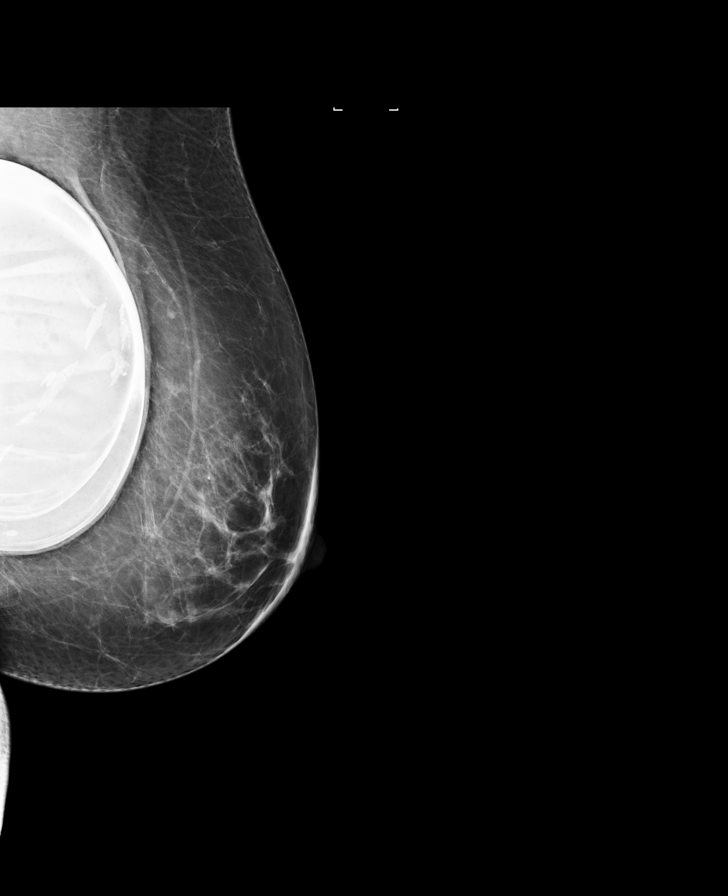

[L MLO synth-2D]
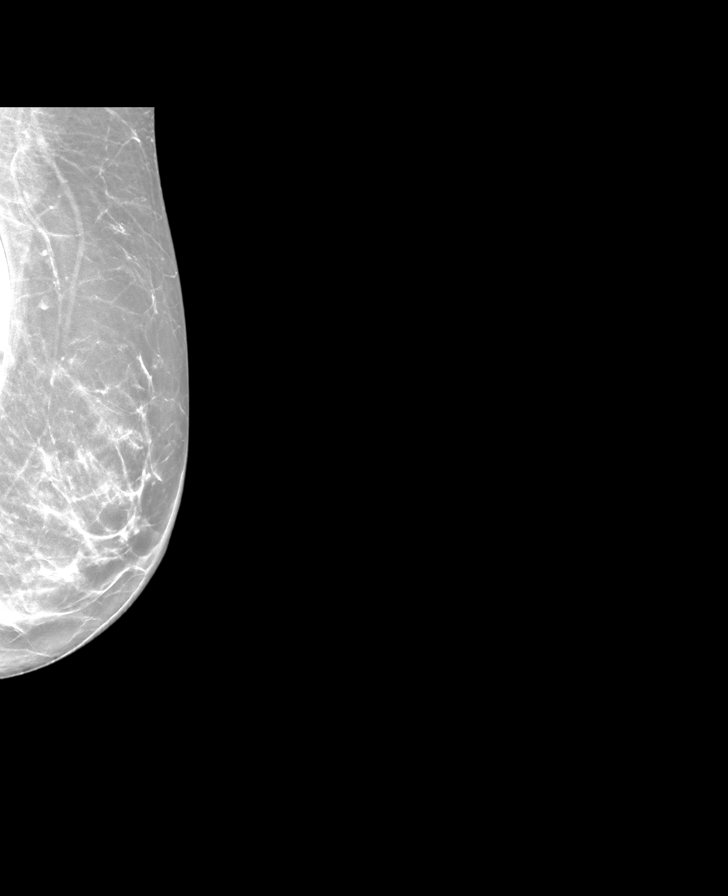

[R CC synth-2D]
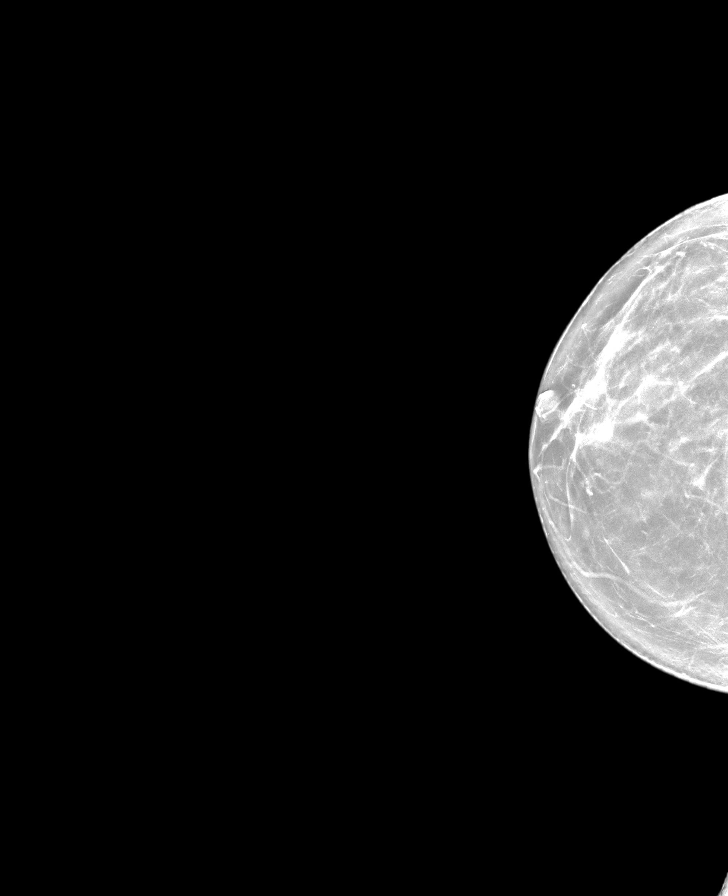

[L CC synth-2D]
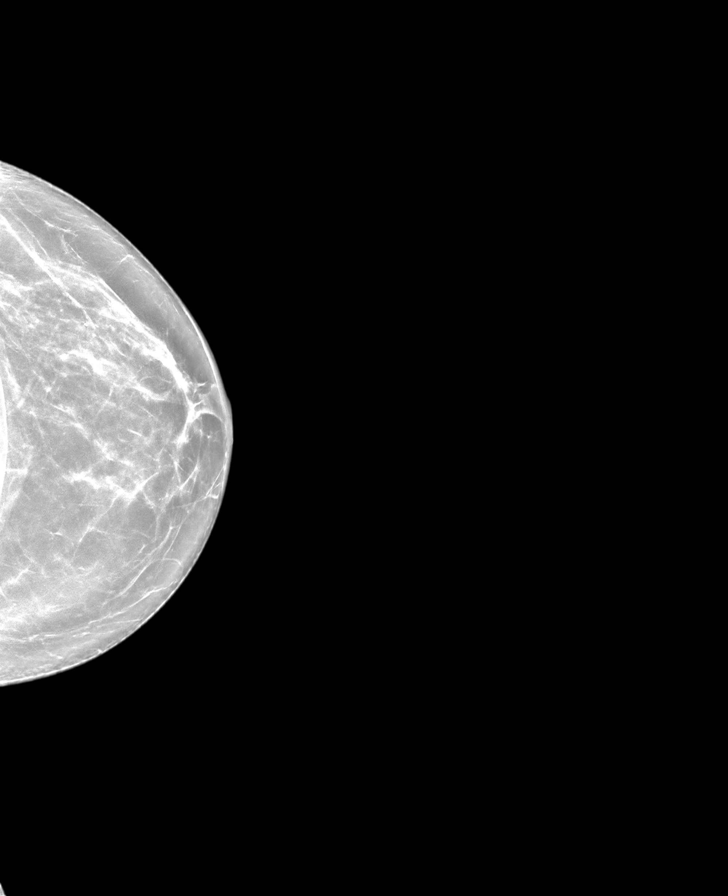

[R MLO synth-2D]
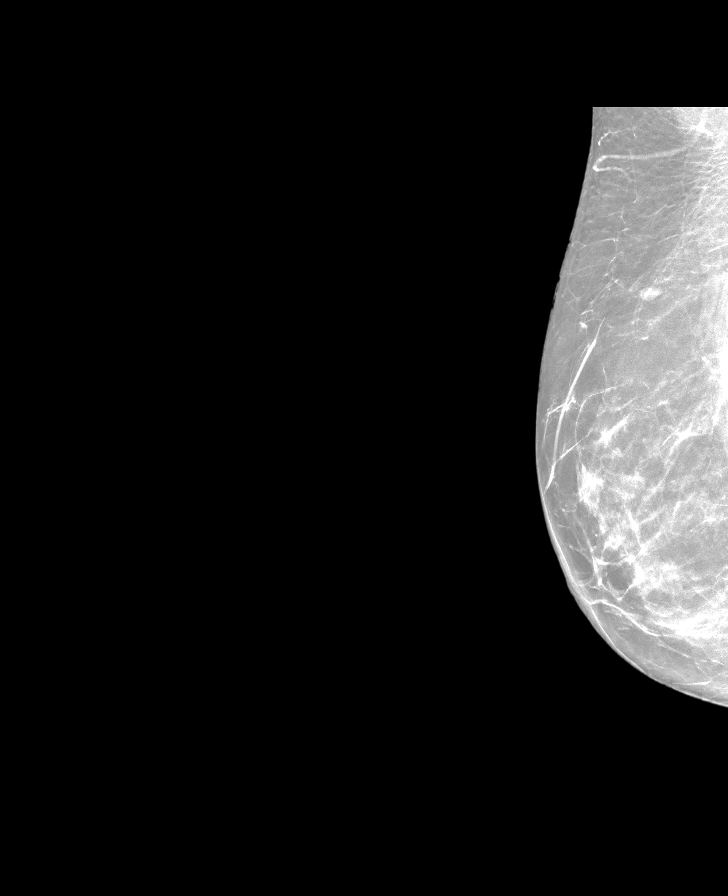

[8 of 28 positions shown; findings below may reference images not displayed]

ACR Breast Density Category b: There are scattered areas of
fibroglandular density.
FINDINGS: There are no findings suspicious for malignancy. Images were
processed with CAD.
IMPRESSION: No mammographic evidence of malignancy. A result letter of this
screening mammogram will be mailed directly to the patient.

RECOMMENDATION:
Screening mammogram in one year. (Code:[id number]-T-8Z4)

BI-RADS CATEGORY  1:  Negative.

## 2020-10-21 DIAGNOSIS — H401211 Low-tension glaucoma, right eye, mild stage: Secondary | ICD-10-CM | POA: Diagnosis not present

## 2020-10-21 DIAGNOSIS — H35371 Puckering of macula, right eye: Secondary | ICD-10-CM | POA: Diagnosis not present

## 2020-10-21 DIAGNOSIS — H26493 Other secondary cataract, bilateral: Secondary | ICD-10-CM | POA: Diagnosis not present

## 2020-10-21 DIAGNOSIS — H401222 Low-tension glaucoma, left eye, moderate stage: Secondary | ICD-10-CM | POA: Diagnosis not present

## 2020-10-21 DIAGNOSIS — H04123 Dry eye syndrome of bilateral lacrimal glands: Secondary | ICD-10-CM | POA: Diagnosis not present

## 2020-10-23 NOTE — Progress Notes (Signed)
[facility] Note  10/27/2020     CHIEF COMPLAINT Patient presents for Retina Evaluation   HISTORY OF PRESENT ILLNESS: Cindy Higgins is a 74 y.o. female who presents to the clinic today for:   HPI    Retina Evaluation    In both eyes.  This started weeks ago.  Duration of weeks.  Context:  distance vision and near vision.          Comments    NP retina eval ref by Dr. Kathlen Mody Pt states vision appears distorted in her right eye, especially while driving.  Patient states left eye seems stable and is her "reading eye".  Patient denies eye pain or discomfort--patient was on Restasis at one time but has discontinued.  Patient denies new or worsening floaters or fol OU.  Pt currently using Pred-Brom: QD OS Pred-Brom: BID OD PF Cosopt: BID OU  Hx of: CEIOL OU (Dr. Kathlen Mody) Goniotomy OU       Last edited by Doneen Poisson on 10/27/2020  9:52 AM. (History)    pt is here on the referral of Dr. Kathlen Mody for concern of ERM, pt states she has had cataract sx on both eyes with Dr. Kathlen Mody, pt is on PF Cospt BID OU and Pred-Brom QD OS and QID OD, pt states the day after she had sx on her right eye, she woke up and her pupil was shaped like a football and it felt like she had a thorn in her eye, she states Dr. Kathlen Mody was able to fix the pupil so it is round again and he had to "clip off" a part of the lens    Referring physician: Hortencia Pilar, MD [place],  [place] [id number]  HISTORICAL INFORMATION:   Selected notes from the MEDICAL RECORD NUMBER Referred by Dr. Kathlen Mody for ERM eval OD   CURRENT MEDICATIONS: Current Outpatient Medications (Ophthalmic Drugs)  Medication Sig  . cycloSPORINE (RESTASIS) 0.05 % ophthalmic emulsion Place 1 drop into both eyes 2 (two) times daily.  . fluorometholone (FML) 0.1 % ophthalmic suspension 1 drop every 4 (four) hours. (Patient not taking: Reported on 07/24/2020)  . latanoprost (XALATAN) 0.005  % ophthalmic solution 1 drop at bedtime. (Patient not taking: Reported on 07/24/2020)  . Travoprost, BAK Free, (TRAVATAN) 0.004 % SOLN ophthalmic solution Place 1 drop into both eyes at bedtime.   No current facility-administered medications for this visit. (Ophthalmic Drugs)   Current Outpatient Medications (Other)  Medication Sig  . ALPRAZolam (XANAX) 0.5 MG tablet TAKE 1 TABLET (0.5 MG TOTAL) BY MOUTH 2 (TWO) TIMES DAILY AS NEEDED.  Marland Kitchen amLODipine (NORVASC) 2.5 MG tablet Take 1 tablet (2.5 mg total) by mouth daily. (Patient not taking: Reported on 07/24/2020)  . calcium-vitamin D (OSCAL WITH D) 250-125 MG-UNIT tablet Take 1 tablet by mouth daily.  . citalopram (CELEXA) 40 MG tablet TAKE 1 TABLET BY MOUTH EVERY DAY  . estradiol (ESTRACE) 0.5 MG tablet Take 0.5 mg by mouth daily.  Marland Kitchen levothyroxine (SYNTHROID) 25 MCG tablet TAKE 1 TABLET BY MOUTH ONCE DAILY BEFORE BREAKFAST   No current facility-administered medications for this visit. (Other)      REVIEW OF SYSTEMS: ROS    Positive for: Endocrine, Eyes, Psychiatric   Negative for: Constitutional, Gastrointestinal, Neurological, Skin, Genitourinary, Musculoskeletal, HENT, Cardiovascular, Respiratory, Allergic/Imm, Heme/Lymph   Last edited by Doneen Poisson on 10/27/2020  9:52 AM. (History)       ALLERGIES Allergies  Allergen Reactions  . Sulfa Antibiotics Anaphylaxis  . Scallops [Shellfish Allergy] Nausea And Vomiting  . Codeine Itching and Rash  . Penicillins Itching    Has patient had a PCN reaction causing immediate rash, facial/tongue/throat swelling, SOB or lightheadedness with hypotension:  Unknown think it was rash Has patient had a PCN reaction causing severe rash involving mucus membranes or skin necrosis: unknown childhood Has patient had a PCN reaction that required hospitalization: unknown Has patient had a PCN reaction occurring within the last 10 years: no childhood If all of the above answers are "NO", then may  proceed with Cephalosporin use.      PAST MEDICAL HISTORY Past Medical History:  Diagnosis Date  . ALLERGIC RHINITIS 12/22/2007  . Anxiety   . ANXIETY DEPRESSION 08/30/2007  . Arthritis    Left thumb bone on bone  . BLURRED VISION, INTERMITTENT 12/22/2007  . COLONIC POLYPS, HX OF 12/22/2007   01/18/2006  . Glaucoma    NTG Glaucoma  . GLUCOSE INTOLERANCE 07/08/2009  . Headache(784.0) 12/22/2007  . Herpes zoster of eye 05/10/2011  . HYPERCHOLESTEROLEMIA 08/30/2007  . HYPERLIPIDEMIA 03/26/2008  . Hypothyroidism 07/24/2020  . Impaired glucose tolerance 05/09/2011  . MITRAL VALVE PROLAPSE, HX OF 08/30/2007  . OSTEOPOROSIS 12/22/2007  . POSTMENOPAUSAL STATUS 08/30/2007  . SINUSITIS- ACUTE-NOS 02/06/2010   Past Surgical History:  Procedure Laterality Date  . ABDOMINAL HYSTERECTOMY    . AUGMENTATION MAMMAPLASTY    . COLONOSCOPY    . COSMETIC SURGERY    . DILATION AND CURETTAGE OF UTERUS  2007  . OOPHORECTOMY    . POLYPECTOMY      FAMILY HISTORY Family History  Problem Relation Age of Onset  . Breast cancer Sister   . Cancer Sister   . Heart disease Mother   . Diabetes Mother   . Hyperlipidemia Mother   . Alcohol abuse Father   . Testicular cancer Son   . Breast cancer Maternal Grandmother     SOCIAL HISTORY Social History   Tobacco Use  . Smoking status: Never Smoker  . Smokeless tobacco: Never Used  Substance Use Topics  . Alcohol use: Yes    Alcohol/week: 2.0 standard drinks    Types: 2 drink(s) per week    Comment: daily  . Drug use: No         OPHTHALMIC EXAM:  Base Eye Exam    Visual Acuity (Snellen - Linear)      Right Left   Dist [place] 20/40 -2 20/80 -1   Dist ph [place] 20/30 -2 20/40 -2       Tonometry (Tonopen, 9:49 AM)      Right Left   Pressure 15 14       Pupils      Dark Light Shape React APD   Right 3 2 Round Brisk 0   Left 3 2 Round Brisk 0       Visual Fields      Left Right    Full Full       Extraocular Movement      Right Left     Full Full       Neuro/Psych    Oriented x3: Yes   Mood/Affect: Normal       Dilation    Both eyes: 1.0% Mydriacyl, 2.5% Phenylephrine @ 9:49 AM        Slit Lamp and Fundus Exam    Slit Lamp Exam      Right Left   Lids/Lashes Dermatochalasis -  upper lid, mild MGD Dermatochalasis - upper lid, mild MGD   Conjunctiva/Sclera White and quiet White and quiet   Cornea mild arcus, well healed temporal cataract wounds mild arcus, well healed temporal cataract wounds, mild Debris in tear film   Anterior Chamber Deep and quiet Deep and quiet, no cell or flare   Iris Round and dilated Round and dilated   Lens Toric PC IOL in good position with marks at 0130 and 0730, trace Posterior capsular opacification Toric PC IOL in good position with marks at 1200 and 0600   Vitreous mild syneresis mild syneresis, Posterior vitreous detachment       Fundus Exam      Right Left   Disc Mild temporal Pallor, Sharp rim, thin inferior rim Mild temporal Pallor, Sharp rim, thin inferior rim, temporal PPA/PPP   C/D Ratio 0.65 0.65   Macula Flat, Blunted foveal reflex, ERM, mild fibrosis superior macula, +central thickening Flat, Blunted foveal reflex, mild RPE mottling and clumping, No heme or edema   Vessels attenuated, Tortuous, mild Copper wiring, mild AV crossing changes attenuated, Tortuous, mild Copper wiring, mild AV crossing changes   Periphery Attached, mild reticular degeneration, mild peripheral drusen Attached, mild reticular degeneration, mild peripheral drusen        Refraction    Manifest Refraction      Sphere Cylinder Axis Dist VA   Right -1.50 +1.00 125 20/30-2   Left -2.00 +1.25 135 20/30-2          IMAGING AND PROCEDURES  Imaging and Procedures for 10/27/2020  OCT, Retina - OU - Both Eyes       Right Eye Quality was good. Central Foveal Thickness: 454. Progression has no prior data. Findings include abnormal foveal contour, no IRF, no SRF, epiretinal membrane, preretinal  fibrosis.   Left Eye Quality was good. Central Foveal Thickness: 256. Progression has no prior data. Findings include no SRF, normal foveal contour, no IRF.   Notes *Images captured and stored on drive  Diagnosis / Impression:  OD: ERM w/central thickening/loss of foveal contour OS: NFP, No IRF/SRF  Clinical management:  See below  Abbreviations: NFP - Normal foveal profile. CME - cystoid macular edema. PED - pigment epithelial detachment. IRF - intraretinal fluid. SRF - subretinal fluid. EZ - ellipsoid zone. ERM - epiretinal membrane. ORA - outer retinal atrophy. ORT - outer retinal tubulation. SRHM - subretinal hyper-reflective material. IRHM - intraretinal hyper-reflective material                ASSESSMENT/PLAN:    ICD-10-CM   1. Epiretinal membrane (ERM) of right eye  H35.371   2. Retinal edema  H35.81 OCT, Retina - OU - Both Eyes  3. Essential hypertension  I10   4. Hypertensive retinopathy of both eyes  H35.033   5. Pseudophakia, both eyes  Z96.1     1,2. Epiretinal membrane, OD - The natural history, anatomy, potential for loss of vision, and treatment options including vitrectomy techniques and the complications of endophthalmitis, retinal detachment, vitreous hemorrhage, cataract progression and permanent vision loss discussed with the patient. - +ERM w/ preretinal fibrosis and central retinal thickening / loss of foveal contour - BCVA 20/30 - asymptomatic, no metamorphopsia - no indication for surgery at this time - monitor for now - f/u 2-3 mos -- DFE/OCT  3,4. Hypertensive retinopathy OU - discussed importance of tight BP control - monitor  5. Pseudophakia OU  - s/p CE/IOL OU (Dr. Kathlen Mody)  - IOLs in good position,  doing well  - history of mild CME OD -- currently tapering Pred-Brom OU - monitor    Ophthalmic Meds Ordered this visit:  No orders of the defined types were placed in this encounter.     Return for f/u 2-3 months, ERM OD, DFE,  OCT.  There are no Patient Instructions on file for this visit.  Explained the diagnoses, plan, and follow up with the patient and they expressed understanding.  Patient expressed understanding of the importance of proper follow up care.   This document serves as a record of services personally performed by Gardiner Sleeper, MD, PhD. It was created on their behalf by Leeann Must, [place], an ophthalmic technician. The creation of this record is the provider's dictation and/or activities during the visit.    Electronically signed by: Leeann Must, [place] 12.16.2021 1:00 PM   This document serves as a record of services personally performed by Gardiner Sleeper, MD, PhD. It was created on their behalf by San Jetty. Owens Shark, OA an ophthalmic technician. The creation of this record is the provider's dictation and/or activities during the visit.    Electronically signed by: San Jetty. Owens Shark, [place] 12.20.2021 1:00 PM  Gardiner Sleeper, M.D., Ph.D. Diseases & Surgery of the Retina and [place] 10/27/2020   I have reviewed the above documentation for accuracy and completeness, and I agree with the above. Gardiner Sleeper, M.D., Ph.D. 10/27/20 1:00 PM   Abbreviations: M myopia (nearsighted); A astigmatism; H hyperopia (farsighted); P presbyopia; Mrx spectacle prescription;  CTL contact lenses; OD right eye; OS left eye; OU both eyes  XT exotropia; ET esotropia; PEK punctate epithelial keratitis; PEE punctate epithelial erosions; DES dry eye syndrome; MGD meibomian gland dysfunction; ATs artificial tears; PFAT's preservative free artificial tears; [place] nuclear sclerotic cataract; PSC posterior subcapsular cataract; ERM epi-retinal membrane; PVD posterior vitreous detachment; RD retinal detachment; DM diabetes mellitus; DR diabetic retinopathy; NPDR non-proliferative diabetic retinopathy; PDR proliferative diabetic retinopathy; CSME clinically significant macular edema; DME diabetic  macular edema; dbh dot blot hemorrhages; CWS cotton wool spot; POAG primary open angle glaucoma; C/D cup-to-disc ratio; HVF humphrey visual field; GVF goldmann visual field; OCT optical coherence tomography; IOP intraocular pressure; BRVO Branch retinal vein occlusion; CRVO central retinal vein occlusion; CRAO central retinal artery occlusion; BRAO branch retinal artery occlusion; RT retinal tear; SB scleral buckle; PPV pars plana vitrectomy; VH Vitreous hemorrhage; PRP panretinal laser photocoagulation; IVK intravitreal kenalog; VMT vitreomacular traction; MH Macular hole;  NVD neovascularization of the disc; NVE neovascularization elsewhere; AREDS age related eye disease study; ARMD age related macular degeneration; POAG primary open angle glaucoma; EBMD epithelial/anterior basement membrane dystrophy; ACIOL anterior chamber intraocular lens; IOL intraocular lens; PCIOL posterior chamber intraocular lens; Phaco/IOL phacoemulsification with intraocular lens placement; [place] photorefractive keratectomy; LASIK laser assisted in situ keratomileusis; HTN hypertension; DM diabetes mellitus; COPD chronic obstructive pulmonary disease

## 2020-10-27 ENCOUNTER — Ambulatory Visit (INDEPENDENT_AMBULATORY_CARE_PROVIDER_SITE_OTHER): Payer: Medicare HMO | Admitting: Ophthalmology

## 2020-10-27 ENCOUNTER — Other Ambulatory Visit: Payer: Self-pay

## 2020-10-27 ENCOUNTER — Encounter (INDEPENDENT_AMBULATORY_CARE_PROVIDER_SITE_OTHER): Payer: Self-pay | Admitting: Ophthalmology

## 2020-10-27 DIAGNOSIS — H35033 Hypertensive retinopathy, bilateral: Secondary | ICD-10-CM

## 2020-10-27 DIAGNOSIS — I10 Essential (primary) hypertension: Secondary | ICD-10-CM

## 2020-10-27 DIAGNOSIS — H3581 Retinal edema: Secondary | ICD-10-CM

## 2020-10-27 DIAGNOSIS — H35371 Puckering of macula, right eye: Secondary | ICD-10-CM | POA: Diagnosis not present

## 2020-10-27 DIAGNOSIS — Z961 Presence of intraocular lens: Secondary | ICD-10-CM | POA: Diagnosis not present

## 2020-11-27 DIAGNOSIS — H401222 Low-tension glaucoma, left eye, moderate stage: Secondary | ICD-10-CM | POA: Diagnosis not present

## 2020-11-27 DIAGNOSIS — H401211 Low-tension glaucoma, right eye, mild stage: Secondary | ICD-10-CM | POA: Diagnosis not present

## 2020-11-27 DIAGNOSIS — H26493 Other secondary cataract, bilateral: Secondary | ICD-10-CM | POA: Diagnosis not present

## 2020-11-27 DIAGNOSIS — H35371 Puckering of macula, right eye: Secondary | ICD-10-CM | POA: Diagnosis not present

## 2020-12-03 DIAGNOSIS — H5319 Other subjective visual disturbances: Secondary | ICD-10-CM | POA: Diagnosis not present

## 2020-12-03 DIAGNOSIS — H26493 Other secondary cataract, bilateral: Secondary | ICD-10-CM | POA: Diagnosis not present

## 2020-12-10 LAB — HM DIABETES EYE EXAM

## 2020-12-11 DIAGNOSIS — H35371 Puckering of macula, right eye: Secondary | ICD-10-CM | POA: Diagnosis not present

## 2020-12-11 DIAGNOSIS — H26493 Other secondary cataract, bilateral: Secondary | ICD-10-CM | POA: Diagnosis not present

## 2020-12-11 DIAGNOSIS — H5319 Other subjective visual disturbances: Secondary | ICD-10-CM | POA: Diagnosis not present

## 2020-12-15 ENCOUNTER — Encounter: Payer: Self-pay | Admitting: Internal Medicine

## 2020-12-22 NOTE — Progress Notes (Signed)
[facility] Note  12/25/2020     CHIEF COMPLAINT Patient presents for Retina Follow Up   HISTORY OF PRESENT ILLNESS: Cindy Higgins is a 75 y.o. female who presents to the clinic today for:   HPI    Retina Follow Up    Patient presents with  Other.  In right eye.  This started 2 months ago.  I, the attending physician,  performed the HPI with the patient and updated documentation appropriately.          Comments    Patient here for 2 ;months retina follow up for ERM OD. Patient states vision doing real well with new glasses. No eye pain.        Last edited by Bernarda Caffey, MD on 12/25/2020 10:27 PM. (History)    pt is here early at the request of Dr. Kathlen Mody due to new fol and floaters, pt saw him on February 3 and was scheduled to have Yag, but Dr. Kathlen Mody decided to hold off after new complains, pt states the flashes lasted for about 15 mins and looked like "stright lines", she states afterwards, she had "webbing" in her vision, she states it looked like a spider web and was stationary when she moved her head, she states it stayed in her vision for a couple of days, pt is taking travoprost at night and Pred-Brom once a day, pt got new glasses from Dr. Kathlen Mody last week and states her vision is much better  Referring physician: Hortencia Pilar, MD [place],  [place] [address]  HISTORICAL INFORMATION:   Selected notes from the MEDICAL RECORD NUMBER Referred by Dr. Kathlen Mody for ERM eval OD   CURRENT MEDICATIONS: Current Outpatient Medications (Ophthalmic Drugs)  Medication Sig  . cycloSPORINE (RESTASIS) 0.05 % ophthalmic emulsion Place 1 drop into both eyes 2 (two) times daily.  . fluorometholone (FML) 0.1 % ophthalmic suspension 1 drop every 4 (four) hours. (Patient not taking: Reported on 07/24/2020)  . latanoprost (XALATAN) 0.005 % ophthalmic solution 1 drop at bedtime. (Patient not taking: Reported on 07/24/2020)  . Travoprost,  BAK Free, (TRAVATAN) 0.004 % SOLN ophthalmic solution Place 1 drop into both eyes at bedtime.   No current facility-administered medications for this visit. (Ophthalmic Drugs)   Current Outpatient Medications (Other)  Medication Sig  . ALPRAZolam (XANAX) 0.5 MG tablet TAKE 1 TABLET (0.5 MG TOTAL) BY MOUTH 2 (TWO) TIMES DAILY AS NEEDED.  Marland Kitchen amLODipine (NORVASC) 2.5 MG tablet Take 1 tablet (2.5 mg total) by mouth daily. (Patient not taking: Reported on 07/24/2020)  . calcium-vitamin D (OSCAL WITH D) 250-125 MG-UNIT tablet Take 1 tablet by mouth daily.  . citalopram (CELEXA) 40 MG tablet TAKE 1 TABLET BY MOUTH EVERY DAY  . estradiol (ESTRACE) 0.5 MG tablet Take 0.5 mg by mouth daily.  Marland Kitchen levothyroxine (SYNTHROID) 25 MCG tablet TAKE 1 TABLET BY MOUTH ONCE DAILY BEFORE BREAKFAST   No current facility-administered medications for this visit. (Other)      REVIEW OF SYSTEMS: ROS    Positive for: Endocrine, Eyes, Psychiatric   Negative for: Constitutional, Gastrointestinal, Neurological, Skin, Genitourinary, Musculoskeletal, HENT, Cardiovascular, Respiratory, Allergic/Imm, Heme/Lymph   Last edited by Theodore Demark, COA on 12/25/2020  8:59 AM. (History)       ALLERGIES Allergies  Allergen Reactions  . Sulfa Antibiotics Anaphylaxis  . Scallops [Shellfish Allergy] Nausea And Vomiting  . Codeine Itching and Rash  . Penicillins Itching  Has patient had a PCN reaction causing immediate rash, facial/tongue/throat swelling, SOB or lightheadedness with hypotension:  Unknown think it was rash Has patient had a PCN reaction causing severe rash involving mucus membranes or skin necrosis: unknown childhood Has patient had a PCN reaction that required hospitalization: unknown Has patient had a PCN reaction occurring within the last 10 years: no childhood If all of the above answers are "NO", then may proceed with Cephalosporin use.      PAST MEDICAL HISTORY Past Medical History:  Diagnosis  Date  . ALLERGIC RHINITIS 12/22/2007  . Anxiety   . ANXIETY DEPRESSION 08/30/2007  . Arthritis    Left thumb bone on bone  . BLURRED VISION, INTERMITTENT 12/22/2007  . COLONIC POLYPS, HX OF 12/22/2007   01/18/2006  . Glaucoma    NTG Glaucoma  . GLUCOSE INTOLERANCE 07/08/2009  . Headache(784.0) 12/22/2007  . Herpes zoster of eye 05/10/2011  . HYPERCHOLESTEROLEMIA 08/30/2007  . HYPERLIPIDEMIA 03/26/2008  . Hypothyroidism 07/24/2020  . Impaired glucose tolerance 05/09/2011  . MITRAL VALVE PROLAPSE, HX OF 08/30/2007  . OSTEOPOROSIS 12/22/2007  . POSTMENOPAUSAL STATUS 08/30/2007  . SINUSITIS- ACUTE-NOS 02/06/2010   Past Surgical History:  Procedure Laterality Date  . ABDOMINAL HYSTERECTOMY    . AUGMENTATION MAMMAPLASTY    . COLONOSCOPY    . COSMETIC SURGERY    . DILATION AND CURETTAGE OF UTERUS  2007  . OOPHORECTOMY    . POLYPECTOMY      FAMILY HISTORY Family History  Problem Relation Age of Onset  . Breast cancer Sister   . Cancer Sister   . Heart disease Mother   . Diabetes Mother   . Hyperlipidemia Mother   . Alcohol abuse Father   . Testicular cancer Son   . Breast cancer Maternal Grandmother     SOCIAL HISTORY Social History   Tobacco Use  . Smoking status: Never Smoker  . Smokeless tobacco: Never Used  Substance Use Topics  . Alcohol use: Yes    Alcohol/week: 2.0 standard drinks    Types: 2 drink(s) per week    Comment: daily  . Drug use: No         OPHTHALMIC EXAM:  Base Eye Exam    Visual Acuity (Snellen - Linear)      Right Left   Dist cc 20/30 20/40 -1   Dist ph cc NI NI   Correction: Glasses       Tonometry (Tonopen, 8:56 AM)      Right Left   Pressure 19 16       Pupils      Dark Light Shape React APD   Right 3 2 Round Brisk None   Left 3 2 Round Brisk None       Visual Fields (Counting fingers)      Left Right    Full Full       Extraocular Movement      Right Left    Full Full       Neuro/Psych    Oriented x3: Yes    Mood/Affect: Normal       Dilation    Both eyes: 1.0% Mydriacyl, 2.5% Phenylephrine @ 8:56 AM        Slit Lamp and Fundus Exam    Slit Lamp Exam      Right Left   Lids/Lashes Dermatochalasis - upper lid, mild MGD Dermatochalasis - upper lid, mild MGD   Conjunctiva/Sclera White and quiet White and quiet   Cornea mild arcus, well  healed temporal cataract wounds, trace tear film debris mild arcus, well healed temporal cataract wounds, mild Debris in tear film   Anterior Chamber Deep and quiet Deep and quiet, no cell or flare   Iris Round and dilated Round and dilated   Lens Toric PC IOL in good position with marks at 0130 and 0730, 1+Posterior capsular opacification Toric PC IOL in good position with marks at 1200 and 0600   Vitreous mild syneresis mild syneresis, Posterior vitreous detachment       Fundus Exam      Right Left   Disc Mild temporal Pallor, Sharp rim, thin inferior rim, temporal PPA Mild temporal Pallor, Sharp rim, thin inferior rim, temporal PPA/PPP   C/D Ratio 0.65 0.65   Macula Flat, Blunted foveal reflex, ERM mild fibrosis superior macula, +central thickening Flat, Blunted foveal reflex, mild RPE mottling and clumping, No heme or edema   Vessels attenuated, Tortuous, mild Copper wiring, mild AV crossing changes attenuated, Tortuous, mild Copper wiring, mild AV crossing changes   Periphery Attached, mild reticular degeneration, mild peripheral drusen, No RT/RD Attached, mild reticular degeneration, mild peripheral drusen        Refraction    Wearing Rx      Sphere Cylinder Axis Add   Right -1.25 +1.75 111 +1.75   Left -0.75 +0.25 084 +1.75          IMAGING AND PROCEDURES  Imaging and Procedures for 12/25/2020  OCT, Retina - OU - Both Eyes       Right Eye Quality was good. Central Foveal Thickness: 468. Progression has worsened. Findings include abnormal foveal contour, no IRF, no SRF, epiretinal membrane, preretinal fibrosis, macular pucker (ERM with  central thickening and PRF, loss of foveal contour, focal increase in PRF superior macula).   Left Eye Quality was good. Central Foveal Thickness: 256. Progression has been stable. Findings include no SRF, normal foveal contour, no IRF.   Notes *Images captured and stored on drive  Diagnosis / Impression:  OD: ERM with central thickening and PRF, loss of foveal contour, focal increase in PRF superior macula OS: NFP, No IRF/SRF  Clinical management:  See below  Abbreviations: NFP - Normal foveal profile. CME - cystoid macular edema. PED - pigment epithelial detachment. IRF - intraretinal fluid. SRF - subretinal fluid. EZ - ellipsoid zone. ERM - epiretinal membrane. ORA - outer retinal atrophy. ORT - outer retinal tubulation. SRHM - subretinal hyper-reflective material. IRHM - intraretinal hyper-reflective material                ASSESSMENT/PLAN:    ICD-10-CM   1. Epiretinal membrane (ERM) of right eye  H35.371   2. Retinal edema  H35.81 OCT, Retina - OU - Both Eyes  3. Essential hypertension  I10   4. Hypertensive retinopathy of both eyes  H35.033   5. Pseudophakia, both eyes  Z96.1     1,2. Epiretinal membrane, OD  - appt pushed forward at the request of Dr. Kathlen Mody due to new flashes of lights and floaters OD  - onset of f/f about 2 wks ago and symptoms have improved since then - BCVA 20/30 -- stable - OCT shows focal thickening of ERM / preretinal fibrosis, superior macula - asymptomatic, no metamorphopsia - no indication for surgery at this time - monitor for now - f/u 2-3 mos -- DFE/OCT - no RT/RD on peripheral examination - clear from a retina standpoint to proceed with YAG capsulotomy OD when pt and surgeon are ready  3,4. Hypertensive retinopathy OU - discussed importance of tight BP control - monitor  5. Pseudophakia OU  - s/p CE/IOL OU (Dr. Kathlen Mody)  - IOLs in good position, doing well  - history of mild CME OD -- currently tapering Pred-Brom OU - was  scheduled for YAG OD earlier this month, but got cancelled due to flashes/floaters as above  - clear from a retina standpoint to proceed with YAG capsulotomy OD when pt and surgeon are ready   Ophthalmic Meds Ordered this visit:  No orders of the defined types were placed in this encounter.     Return for f/u 2-3 months, ERM OD, DFE, OCT.  There are no Patient Instructions on file for this visit.  Explained the diagnoses, plan, and follow up with the patient and they expressed understanding.  Patient expressed understanding of the importance of proper follow up care.   This document serves as a record of services personally performed by Gardiner Sleeper, MD, PhD. It was created on their behalf by Leonie Douglas, an ophthalmic technician. The creation of this record is the provider's dictation and/or activities during the visit.    Electronically signed by: Leonie Douglas COA, 12/25/20  10:34 PM   This document serves as a record of services personally performed by Gardiner Sleeper, MD, PhD. It was created on their behalf by San Jetty. Owens Shark, OA an ophthalmic technician. The creation of this record is the provider's dictation and/or activities during the visit.    Electronically signed by: San Jetty. Marguerita Merles 02.17.2022 10:34 PM   Gardiner Sleeper, M.D., Ph.D. Diseases & Surgery of the Retina and Vitreous [facility]  I have reviewed the above documentation for accuracy and completeness, and I agree with the above. Gardiner Sleeper, M.D., Ph.D. 12/25/20 10:34 PM   Abbreviations: M myopia (nearsighted); A astigmatism; H hyperopia (farsighted); P presbyopia; Mrx spectacle prescription;  CTL contact lenses; OD right eye; OS left eye; OU both eyes  XT exotropia; ET esotropia; PEK punctate epithelial keratitis; PEE punctate epithelial erosions; DES dry eye syndrome; MGD meibomian gland dysfunction; ATs artificial tears; PFAT's preservative free artificial tears; [place] nuclear  sclerotic cataract; PSC posterior subcapsular cataract; ERM epi-retinal membrane; PVD posterior vitreous detachment; RD retinal detachment; DM diabetes mellitus; DR diabetic retinopathy; NPDR non-proliferative diabetic retinopathy; PDR proliferative diabetic retinopathy; CSME clinically significant macular edema; DME diabetic macular edema; dbh dot blot hemorrhages; CWS cotton wool spot; POAG primary open angle glaucoma; C/D cup-to-disc ratio; HVF humphrey visual field; GVF goldmann visual field; OCT optical coherence tomography; IOP intraocular pressure; BRVO Branch retinal vein occlusion; CRVO central retinal vein occlusion; CRAO central retinal artery occlusion; BRAO branch retinal artery occlusion; RT retinal tear; SB scleral buckle; PPV pars plana vitrectomy; VH Vitreous hemorrhage; PRP panretinal laser photocoagulation; IVK intravitreal kenalog; VMT vitreomacular traction; MH Macular hole;  NVD neovascularization of the disc; NVE neovascularization elsewhere; AREDS age related eye disease study; ARMD age related macular degeneration; POAG primary open angle glaucoma; EBMD epithelial/anterior basement membrane dystrophy; ACIOL anterior chamber intraocular lens; IOL intraocular lens; PCIOL posterior chamber intraocular lens; Phaco/IOL phacoemulsification with intraocular lens placement; [place] photorefractive keratectomy; LASIK laser assisted in situ keratomileusis; HTN hypertension; DM diabetes mellitus; COPD chronic obstructive pulmonary disease

## 2020-12-25 ENCOUNTER — Other Ambulatory Visit: Payer: Self-pay

## 2020-12-25 ENCOUNTER — Encounter (INDEPENDENT_AMBULATORY_CARE_PROVIDER_SITE_OTHER): Payer: Self-pay | Admitting: Ophthalmology

## 2020-12-25 ENCOUNTER — Ambulatory Visit (INDEPENDENT_AMBULATORY_CARE_PROVIDER_SITE_OTHER): Payer: Medicare HMO | Admitting: Ophthalmology

## 2020-12-25 DIAGNOSIS — H35371 Puckering of macula, right eye: Secondary | ICD-10-CM

## 2020-12-25 DIAGNOSIS — H35033 Hypertensive retinopathy, bilateral: Secondary | ICD-10-CM

## 2020-12-25 DIAGNOSIS — Z961 Presence of intraocular lens: Secondary | ICD-10-CM

## 2020-12-25 DIAGNOSIS — I10 Essential (primary) hypertension: Secondary | ICD-10-CM | POA: Diagnosis not present

## 2020-12-25 DIAGNOSIS — H3581 Retinal edema: Secondary | ICD-10-CM | POA: Diagnosis not present

## 2020-12-29 ENCOUNTER — Other Ambulatory Visit: Payer: Self-pay | Admitting: Internal Medicine

## 2020-12-29 NOTE — Telephone Encounter (Signed)
Please refill as per office routine med refill policy (all routine meds refilled for 3 mo or monthly per pt preference up to one year from last visit, then month to month grace period for 3 mo, then further med refills will have to be denied)  

## 2021-01-05 ENCOUNTER — Encounter (INDEPENDENT_AMBULATORY_CARE_PROVIDER_SITE_OTHER): Payer: Managed Care, Other (non HMO) | Admitting: Ophthalmology

## 2021-01-12 DIAGNOSIS — H26491 Other secondary cataract, right eye: Secondary | ICD-10-CM | POA: Diagnosis not present

## 2021-01-30 ENCOUNTER — Other Ambulatory Visit: Payer: Self-pay | Admitting: Internal Medicine

## 2021-02-25 NOTE — Progress Notes (Signed)
[facility] Note  03/02/2021     CHIEF COMPLAINT Patient presents for Retina Follow Up   HISTORY OF PRESENT ILLNESS: Cindy Higgins is a 75 y.o. female who presents to the clinic today for:   HPI    Retina Follow Up    Patient presents with  Other.  In right eye.  I, the attending physician,  performed the HPI with the patient and updated documentation appropriately.          Comments    Pt here for 9 week retinal follow up ERM OD. Pt states she feels vision with her glasses is doing OK. She isnt driving much more unless she is familiar with the area. No ocular pain or discomfort. Pt saw Dr. Kathlen Mody and reports having a laser done on OD which she reports did not work, performed 3/28. Pt now on gtts for dry eyes, Eysuvis.        Last edited by Bernarda Caffey, MD on 03/02/2021  1:04 PM. (History)    pt states her vision is stable, Dr. Kathlen Mody did a yag OD, but pt doesn't feel like it helped her vision.   Referring physician: Hortencia Pilar, MD [place],  [place] [address]  HISTORICAL INFORMATION:   Selected notes from the MEDICAL RECORD NUMBER Referred by Dr. Kathlen Mody for ERM eval OD   CURRENT MEDICATIONS: Current Outpatient Medications (Ophthalmic Drugs)  Medication Sig  . Loteprednol Etabonate (EYSUVIS) 0.25 % SUSP Apply 1 drop to eye 2 (two) times daily. OU  . cycloSPORINE (RESTASIS) 0.05 % ophthalmic emulsion Place 1 drop into both eyes 2 (two) times daily.  . fluorometholone (FML) 0.1 % ophthalmic suspension 1 drop every 4 (four) hours. (Patient not taking: Reported on 07/24/2020)  . latanoprost (XALATAN) 0.005 % ophthalmic solution 1 drop at bedtime. (Patient not taking: Reported on 07/24/2020)  . Travoprost, BAK Free, (TRAVATAN) 0.004 % SOLN ophthalmic solution Place 1 drop into both eyes at bedtime.   No current facility-administered medications for this visit. (Ophthalmic Drugs)   Current Outpatient Medications (Other)   Medication Sig  . levothyroxine (SYNTHROID) 25 MCG tablet TAKE 1 TABLET BY MOUTH ONCE DAILY BEFORE BREAKFAST  . ALPRAZolam (XANAX) 0.5 MG tablet TAKE 1 TABLET (0.5 MG TOTAL) BY MOUTH 2 (TWO) TIMES DAILY AS NEEDED.  Marland Kitchen amLODipine (NORVASC) 2.5 MG tablet Take 1 tablet (2.5 mg total) by mouth daily. (Patient not taking: Reported on 07/24/2020)  . calcium-vitamin D (OSCAL WITH D) 250-125 MG-UNIT tablet Take 1 tablet by mouth daily.  . citalopram (CELEXA) 40 MG tablet TAKE 1 TABLET BY MOUTH EVERY DAY  . estradiol (ESTRACE) 0.5 MG tablet Take 0.5 mg by mouth daily.   No current facility-administered medications for this visit. (Other)      REVIEW OF SYSTEMS: ROS    Positive for: Endocrine, Eyes, Psychiatric   Negative for: Constitutional, Gastrointestinal, Neurological, Skin, Genitourinary, Musculoskeletal, HENT, Cardiovascular, Respiratory, Allergic/Imm, Heme/Lymph   Last edited by Kingsley Spittle, COT on 03/02/2021  9:58 AM. (History)       ALLERGIES Allergies  Allergen Reactions  . Sulfa Antibiotics Anaphylaxis  . Scallops [Shellfish Allergy] Nausea And Vomiting  . Codeine Itching and Rash  . Penicillins Itching    Has patient had a PCN reaction causing immediate rash, facial/tongue/throat swelling, SOB or lightheadedness with hypotension:  Unknown think it was rash Has patient had a PCN reaction causing severe rash involving mucus membranes or skin necrosis: unknown  childhood Has patient had a PCN reaction that required hospitalization: unknown Has patient had a PCN reaction occurring within the last 10 years: no childhood If all of the above answers are "NO", then may proceed with Cephalosporin use.      PAST MEDICAL HISTORY Past Medical History:  Diagnosis Date  . ALLERGIC RHINITIS 12/22/2007  . Anxiety   . ANXIETY DEPRESSION 08/30/2007  . Arthritis    Left thumb bone on bone  . BLURRED VISION, INTERMITTENT 12/22/2007  . COLONIC POLYPS, HX OF 12/22/2007   01/18/2006  .  Glaucoma    NTG Glaucoma  . GLUCOSE INTOLERANCE 07/08/2009  . Headache(784.0) 12/22/2007  . Herpes zoster of eye 05/10/2011  . HYPERCHOLESTEROLEMIA 08/30/2007  . HYPERLIPIDEMIA 03/26/2008  . Hypothyroidism 07/24/2020  . Impaired glucose tolerance 05/09/2011  . MITRAL VALVE PROLAPSE, HX OF 08/30/2007  . OSTEOPOROSIS 12/22/2007  . POSTMENOPAUSAL STATUS 08/30/2007  . SINUSITIS- ACUTE-NOS 02/06/2010   Past Surgical History:  Procedure Laterality Date  . ABDOMINAL HYSTERECTOMY    . AUGMENTATION MAMMAPLASTY    . COLONOSCOPY    . COSMETIC SURGERY    . DILATION AND CURETTAGE OF UTERUS  2007  . OOPHORECTOMY    . POLYPECTOMY      FAMILY HISTORY Family History  Problem Relation Age of Onset  . Breast cancer Sister   . Cancer Sister   . Heart disease Mother   . Diabetes Mother   . Hyperlipidemia Mother   . Alcohol abuse Father   . Testicular cancer Son   . Breast cancer Maternal Grandmother     SOCIAL HISTORY Social History   Tobacco Use  . Smoking status: Never Smoker  . Smokeless tobacco: Never Used  Substance Use Topics  . Alcohol use: Yes    Alcohol/week: 2.0 standard drinks    Types: 2 drink(s) per week    Comment: daily  . Drug use: No         OPHTHALMIC EXAM:  Base Eye Exam    Visual Acuity (Snellen - Linear)      Right Left   Dist cc 20/25 20/40 +1   Dist ph cc NI NI   Correction: Glasses       Tonometry (Tonopen, 10:11 AM)      Right Left   Pressure 14 14       Pupils      Dark Light Shape React APD   Right 3 2 Round Brisk None   Left 3 2 Round Brisk None       Visual Fields (Counting fingers)      Left Right    Full Full       Extraocular Movement      Right Left    Full, Ortho Full, Ortho       Neuro/Psych    Oriented x3: Yes   Mood/Affect: Normal       Dilation    Both eyes: 1.0% Mydriacyl, 2.5% Phenylephrine @ 10:12 AM        Slit Lamp and Fundus Exam    Slit Lamp Exam      Right Left   Lids/Lashes Dermatochalasis - upper lid,  mild MGD Dermatochalasis - upper lid, mild MGD   Conjunctiva/Sclera White and quiet White and quiet   Cornea mild arcus, well healed temporal cataract wounds, trace tear film debris mild arcus, well healed temporal cataract wounds, mild Debris in tear film   Anterior Chamber Deep and quiet Deep and quiet, no cell or flare  Iris Round and dilated Round and dilated   Lens Toric PC IOL in good position with marks at 0130 and 0730 with open PC Toric PC IOL in good position with marks at 1200 and 0600   Vitreous mild syneresis mild syneresis, Posterior vitreous detachment       Fundus Exam      Right Left   Disc Mild temporal Pallor, Sharp rim, thin inferior rim, temporal PPA, +cupping Mild temporal Pallor, Sharp rim, thin inferior rim, temporal PPA/PPP, +cupping   C/D Ratio 0.65 0.65   Macula Flat, Blunted foveal reflex, ERM mild fibrosis superior macula, +central thickening Flat, Blunted foveal reflex, mild RPE mottling and clumping, No heme or edema   Vessels attenuated, Tortuous attenuated, Tortuous, mild Copper wiring, mild AV crossing changes   Periphery Attached, mild reticular degeneration, mild peripheral drusen, No RT/RD Attached, mild reticular degeneration, mild peripheral drusen        Refraction    Wearing Rx      Sphere Cylinder Axis Add   Right -1.25 +1.75 111 +1.75   Left -1.75 +1.00 117 +1.75          IMAGING AND PROCEDURES  Imaging and Procedures for 03/02/2021  OCT, Retina - OU - Both Eyes       Right Eye Quality was good. Central Foveal Thickness: 484. Progression has worsened. Findings include abnormal foveal contour, no IRF, no SRF, epiretinal membrane, preretinal fibrosis, macular pucker (Mild increase in central pucker and PRF).   Left Eye Quality was good. Central Foveal Thickness: 256. Progression has been stable. Findings include no SRF, normal foveal contour, no IRF.   Notes *Images captured and stored on drive  Diagnosis / Impression:  OD: ERM  with Mild increase in central pucker and PRF OS: NFP, No IRF/SRF  Clinical management:  See below  Abbreviations: NFP - Normal foveal profile. CME - cystoid macular edema. PED - pigment epithelial detachment. IRF - intraretinal fluid. SRF - subretinal fluid. EZ - ellipsoid zone. ERM - epiretinal membrane. ORA - outer retinal atrophy. ORT - outer retinal tubulation. SRHM - subretinal hyper-reflective material. IRHM - intraretinal hyper-reflective material                ASSESSMENT/PLAN:    ICD-10-CM   1. Epiretinal membrane (ERM) of right eye  H35.371   2. Retinal edema  H35.81 OCT, Retina - OU - Both Eyes  3. Essential hypertension  I10   4. Hypertensive retinopathy of both eyes  H35.033   5. Pseudophakia, both eyes  Z96.1     1,2. Epiretinal membrane, OD - BCVA 20/25 -- improved - OCT shows mild increase in central pucker and PRF - remains asymptomatic, no metamorphopsia - no indication for surgery at this time - monitor for now - f/u 3 mos -- DFE/OCT - no RT/RD on peripheral examination  3,4. Hypertensive retinopathy OU - discussed importance of tight BP control - monitor   5. Pseudophakia OU  - s/p CE/IOL OU (Dr. Kathlen Mody)  - IOLs in good position, doing well  - history of mild CME OD -- resolved today - s/p YAG OD good PC opening   Ophthalmic Meds Ordered this visit:  No orders of the defined types were placed in this encounter.     Return in about 3 months (around 06/01/2021) for f/u ERM OD, DFE, OCT.  There are no Patient Instructions on file for this visit.  Explained the diagnoses, plan, and follow up with the patient and they expressed  understanding.  Patient expressed understanding of the importance of proper follow up care.   This document serves as a record of services personally performed by Gardiner Sleeper, MD, PhD. It was created on their behalf by Leonie Douglas, an ophthalmic technician. The creation of this record is the provider's dictation  and/or activities during the visit.    Electronically signed by: Leonie Douglas COA, 03/02/21  1:07 PM   Gardiner Sleeper, M.D., Ph.D. Diseases & Surgery of the Retina and [place] 04.25.22  I have reviewed the above documentation for accuracy and completeness, and I agree with the above. Gardiner Sleeper, M.D., Ph.D. 03/02/21 1:07 PM   Abbreviations: M myopia (nearsighted); A astigmatism; H hyperopia (farsighted); P presbyopia; Mrx spectacle prescription;  CTL contact lenses; OD right eye; OS left eye; OU both eyes  XT exotropia; ET esotropia; PEK punctate epithelial keratitis; PEE punctate epithelial erosions; DES dry eye syndrome; MGD meibomian gland dysfunction; ATs artificial tears; PFAT's preservative free artificial tears; [place] nuclear sclerotic cataract; PSC posterior subcapsular cataract; ERM epi-retinal membrane; PVD posterior vitreous detachment; RD retinal detachment; DM diabetes mellitus; DR diabetic retinopathy; NPDR non-proliferative diabetic retinopathy; PDR proliferative diabetic retinopathy; CSME clinically significant macular edema; DME diabetic macular edema; dbh dot blot hemorrhages; CWS cotton wool spot; POAG primary open angle glaucoma; C/D cup-to-disc ratio; HVF humphrey visual field; GVF goldmann visual field; OCT optical coherence tomography; IOP intraocular pressure; BRVO Branch retinal vein occlusion; CRVO central retinal vein occlusion; CRAO central retinal artery occlusion; BRAO branch retinal artery occlusion; RT retinal tear; SB scleral buckle; PPV pars plana vitrectomy; VH Vitreous hemorrhage; PRP panretinal laser photocoagulation; IVK intravitreal kenalog; VMT vitreomacular traction; MH Macular hole;  NVD neovascularization of the disc; NVE neovascularization elsewhere; AREDS age related eye disease study; ARMD age related macular degeneration; POAG primary open angle glaucoma; EBMD epithelial/anterior basement membrane dystrophy; ACIOL  anterior chamber intraocular lens; IOL intraocular lens; PCIOL posterior chamber intraocular lens; Phaco/IOL phacoemulsification with intraocular lens placement; [place] photorefractive keratectomy; LASIK laser assisted in situ keratomileusis; HTN hypertension; DM diabetes mellitus; COPD chronic obstructive pulmonary disease

## 2021-03-02 ENCOUNTER — Other Ambulatory Visit: Payer: Self-pay

## 2021-03-02 ENCOUNTER — Ambulatory Visit (INDEPENDENT_AMBULATORY_CARE_PROVIDER_SITE_OTHER): Payer: Medicare HMO | Admitting: Ophthalmology

## 2021-03-02 ENCOUNTER — Encounter (INDEPENDENT_AMBULATORY_CARE_PROVIDER_SITE_OTHER): Payer: Self-pay | Admitting: Ophthalmology

## 2021-03-02 DIAGNOSIS — H3581 Retinal edema: Secondary | ICD-10-CM | POA: Diagnosis not present

## 2021-03-02 DIAGNOSIS — H35033 Hypertensive retinopathy, bilateral: Secondary | ICD-10-CM | POA: Diagnosis not present

## 2021-03-02 DIAGNOSIS — Z961 Presence of intraocular lens: Secondary | ICD-10-CM | POA: Diagnosis not present

## 2021-03-02 DIAGNOSIS — H35371 Puckering of macula, right eye: Secondary | ICD-10-CM

## 2021-03-02 DIAGNOSIS — I10 Essential (primary) hypertension: Secondary | ICD-10-CM

## 2021-04-12 ENCOUNTER — Other Ambulatory Visit: Payer: Self-pay | Admitting: Internal Medicine

## 2021-04-15 ENCOUNTER — Telehealth: Payer: Self-pay

## 2021-04-15 ENCOUNTER — Telehealth (INDEPENDENT_AMBULATORY_CARE_PROVIDER_SITE_OTHER): Payer: Medicare HMO | Admitting: Internal Medicine

## 2021-04-15 DIAGNOSIS — Z7289 Other problems related to lifestyle: Secondary | ICD-10-CM | POA: Diagnosis not present

## 2021-04-15 DIAGNOSIS — J069 Acute upper respiratory infection, unspecified: Secondary | ICD-10-CM | POA: Diagnosis not present

## 2021-04-15 DIAGNOSIS — R69 Illness, unspecified: Secondary | ICD-10-CM | POA: Diagnosis not present

## 2021-04-15 DIAGNOSIS — F341 Dysthymic disorder: Secondary | ICD-10-CM | POA: Diagnosis not present

## 2021-04-15 DIAGNOSIS — R7302 Impaired glucose tolerance (oral): Secondary | ICD-10-CM

## 2021-04-15 DIAGNOSIS — E039 Hypothyroidism, unspecified: Secondary | ICD-10-CM | POA: Diagnosis not present

## 2021-04-15 NOTE — Telephone Encounter (Signed)
Contact patient to schedule a ROV per Dr Jenny Reichmann. Left patient a voicemail to call office back.

## 2021-04-15 NOTE — Progress Notes (Signed)
Patient ID: Cindy Higgins, female   DOB: 09/28/1946, 75 y.o.   MRN: [id number]  Virtual Visit via Video Note  I connected with Cindy Higgins on 04/15/21 at  1:40 PM EDT by a video enabled telemedicine application and verified that I am speaking with the correct person using two identifiers.  Location of all participants tonight Patient: at home Provider: at office   I discussed the limitations of evaluation and management by telemedicine and the availability of in person appointments. The patient expressed understanding and agreed to proceed.  History of Present Illness: Here with 2 wks acute onset uRI liek symptoms with ha, feverish, cough and head congestion with clearish draiange, mild ST but Pt denies chest pain, increased sob or doe, wheezing, orthopnea, PND, increased LE swelling, palpitations, dizziness or syncope.  Had some mild diarrhea yesterday but now resolved.  Overall feels much improved since made her appt.  COVID neg x 2.  Denies worsening reflux, abd pain, dysphagia, n/v, or blood.   Pt denies polydipsia, polyuria, or new focal neuro s/s.  Marland KitchenDenies hyper or hypo thyroid symptoms such as voice, skin or hair change.  Has some worseing anxiety and alcohol use but does not want to go into detail for now - wants to discuss in person next available.  Denies SI or HI.   Past Medical History:  Diagnosis Date   ALLERGIC RHINITIS 12/22/2007   Anxiety    ANXIETY DEPRESSION 08/30/2007   Arthritis    Left thumb bone on bone   BLURRED VISION, INTERMITTENT 12/22/2007   COLONIC POLYPS, HX OF 12/22/2007   01/18/2006   Glaucoma    NTG Glaucoma   GLUCOSE INTOLERANCE 07/08/2009   Headache(784.0) 12/22/2007   Herpes zoster of eye 05/10/2011   HYPERCHOLESTEROLEMIA 08/30/2007   HYPERLIPIDEMIA 03/26/2008   Hypothyroidism 07/24/2020   Impaired glucose tolerance 05/09/2011   MITRAL VALVE PROLAPSE, HX OF 08/30/2007   OSTEOPOROSIS 12/22/2007   POSTMENOPAUSAL STATUS 08/30/2007   SINUSITIS- ACUTE-NOS  02/06/2010   Past Surgical History:  Procedure Laterality Date   ABDOMINAL HYSTERECTOMY     AUGMENTATION MAMMAPLASTY     COLONOSCOPY     COSMETIC SURGERY     DILATION AND CURETTAGE OF UTERUS  2007   OOPHORECTOMY     POLYPECTOMY      reports that she has never smoked. She has never used smokeless tobacco. She reports current alcohol use of about 2.0 standard drinks of alcohol per week. She reports that she does not use drugs. family history includes Alcohol abuse in her father; Breast cancer in her maternal grandmother and sister; Cancer in her sister; Diabetes in her mother; Heart disease in her mother; Hyperlipidemia in her mother; Testicular cancer in her son. Allergies  Allergen Reactions   Sulfa Antibiotics Anaphylaxis   Other    Scallops [Shellfish Allergy] Nausea And Vomiting   Codeine Itching and Rash   Penicillins Itching    Has patient had a PCN reaction causing immediate rash, facial/tongue/throat swelling, SOB or lightheadedness with hypotension:  Unknown think it was rash Has patient had a PCN reaction causing severe rash involving mucus membranes or skin necrosis: unknown childhood Has patient had a PCN reaction that required hospitalization: unknown Has patient had a PCN reaction occurring within the last 10 years: no childhood If all of the above answers are "NO", then may proceed with Cephalosporin use.     Current Outpatient Medications on File Prior to Visit  Medication Sig Dispense Refill   levothyroxine (SYNTHROID)  25 MCG tablet TAKE 1 TABLET BY MOUTH ONCE DAILY BEFORE BREAKFAST (Patient not taking: Reported on 04/20/2021) 90 tablet 2   ALPRAZolam (XANAX) 0.5 MG tablet TAKE 1 TABLET BY MOUTH 2 TIMES DAILY AS NEEDED. 60 tablet 2   calcium-vitamin D (OSCAL WITH D) 250-125 MG-UNIT tablet Take 1 tablet by mouth daily. (Patient not taking: Reported on 04/20/2021)     citalopram (CELEXA) 40 MG tablet TAKE 1 TABLET BY MOUTH EVERY DAY 90 tablet 1   cycloSPORINE (RESTASIS)  0.05 % ophthalmic emulsion Place 1 drop into both eyes 2 (two) times daily. (Patient not taking: Reported on 04/20/2021) 0.4 mL 11   estradiol (ESTRACE) 0.5 MG tablet Take 0.5 mg by mouth daily.     fluorometholone (FML) 0.1 % ophthalmic suspension 1 drop every 4 (four) hours. (Patient not taking: No sig reported)     latanoprost (XALATAN) 0.005 % ophthalmic solution 1 drop at bedtime. (Patient not taking: No sig reported)     Loteprednol Etabonate (EYSUVIS) 0.25 % SUSP Apply 1 drop to eye 2 (two) times daily. OU (Patient not taking: Reported on 04/20/2021)     Travoprost, BAK Free, (TRAVATAN) 0.004 % SOLN ophthalmic solution Place 1 drop into both eyes at bedtime. 5 mL 11   No current facility-administered medications on file prior to visit.    Observations/Objective: Alert, NAD, appropriate mood and affect, resps normal, cn 2-12 intact, moves all 4s, no visible rash or swelling Lab Results  Component Value Date   WBC 7.2 01/14/2020   HGB 13.2 01/14/2020   HCT 38.8 01/14/2020   PLT 334.0 01/14/2020   GLUCOSE 91 01/14/2020   CHOL 247 (H) 01/14/2020   TRIG 248.0 (H) 01/14/2020   HDL 70.00 01/14/2020   LDLDIRECT 142.0 01/14/2020   LDLCALC 130 (H) 01/16/2019   ALT 14 01/14/2020   AST 18 01/14/2020   NA 137 01/14/2020   K 4.0 01/14/2020   CL 101 01/14/2020   CREATININE 0.60 01/14/2020   BUN 16 01/14/2020   CO2 29 01/14/2020   TSH 2.10 07/24/2020   HGBA1C 5.6 01/14/2020   Assessment and Plan: See notes  Follow Up Instructions: See notes   I discussed the assessment and treatment plan with the patient. The patient was provided an opportunity to ask questions and all were answered. The patient agreed with the plan and demonstrated an understanding of the instructions.   The patient was advised to call back or seek an in-person evaluation if the symptoms worsen or if the condition fails to improve as anticipated.  Cathlean Cower, MD

## 2021-04-20 ENCOUNTER — Encounter: Payer: Self-pay | Admitting: Internal Medicine

## 2021-04-20 ENCOUNTER — Other Ambulatory Visit: Payer: Self-pay

## 2021-04-20 ENCOUNTER — Ambulatory Visit (INDEPENDENT_AMBULATORY_CARE_PROVIDER_SITE_OTHER): Payer: Medicare HMO | Admitting: Internal Medicine

## 2021-04-20 VITALS — BP 164/88 | HR 106 | Ht 64.0 in | Wt 141.0 lb

## 2021-04-20 DIAGNOSIS — R7302 Impaired glucose tolerance (oral): Secondary | ICD-10-CM

## 2021-04-20 DIAGNOSIS — E7800 Pure hypercholesterolemia, unspecified: Secondary | ICD-10-CM

## 2021-04-20 DIAGNOSIS — F1090 Alcohol use, unspecified, uncomplicated: Secondary | ICD-10-CM | POA: Insufficient documentation

## 2021-04-20 DIAGNOSIS — E538 Deficiency of other specified B group vitamins: Secondary | ICD-10-CM

## 2021-04-20 DIAGNOSIS — J069 Acute upper respiratory infection, unspecified: Secondary | ICD-10-CM | POA: Insufficient documentation

## 2021-04-20 DIAGNOSIS — F341 Dysthymic disorder: Secondary | ICD-10-CM | POA: Diagnosis not present

## 2021-04-20 DIAGNOSIS — I10 Essential (primary) hypertension: Secondary | ICD-10-CM

## 2021-04-20 DIAGNOSIS — E559 Vitamin D deficiency, unspecified: Secondary | ICD-10-CM | POA: Diagnosis not present

## 2021-04-20 DIAGNOSIS — R69 Illness, unspecified: Secondary | ICD-10-CM | POA: Diagnosis not present

## 2021-04-20 DIAGNOSIS — Z0001 Encounter for general adult medical examination with abnormal findings: Secondary | ICD-10-CM | POA: Diagnosis not present

## 2021-04-20 DIAGNOSIS — M8580 Other specified disorders of bone density and structure, unspecified site: Secondary | ICD-10-CM | POA: Insufficient documentation

## 2021-04-20 DIAGNOSIS — Z789 Other specified health status: Secondary | ICD-10-CM

## 2021-04-20 DIAGNOSIS — Z7289 Other problems related to lifestyle: Secondary | ICD-10-CM

## 2021-04-20 LAB — LIPID PANEL
Cholesterol: 275 mg/dL — ABNORMAL HIGH (ref 0–200)
HDL: 68.9 mg/dL (ref >39.00)
NonHDL: 205.91
Total CHOL/HDL Ratio: 4
Triglycerides: 257 mg/dL — ABNORMAL HIGH (ref 0.0–149.0)
VLDL: 51.4 mg/dL — ABNORMAL HIGH (ref 0.0–40.0)

## 2021-04-20 LAB — CBC WITH DIFFERENTIAL/PLATELET
Basophils Absolute: 0.1 10*3/uL (ref 0.0–0.1)
Basophils Relative: 1.5 % (ref 0.0–3.0)
Eosinophils Absolute: 0.2 10*3/uL (ref 0.0–0.7)
Eosinophils Relative: 3.8 % (ref 0.0–5.0)
HCT: 40.1 % (ref 36.0–46.0)
Hemoglobin: 13.4 g/dL (ref 12.0–15.0)
Lymphocytes Relative: 29.1 % (ref 12.0–46.0)
Lymphs Abs: 1.9 10*3/uL (ref 0.7–4.0)
MCHC: 33.4 g/dL (ref 30.0–36.0)
MCV: 101.5 fl — ABNORMAL HIGH (ref 78.0–100.0)
Monocytes Absolute: 0.6 10*3/uL (ref 0.1–1.0)
Monocytes Relative: 9.2 % (ref 3.0–12.0)
Neutro Abs: 3.8 10*3/uL (ref 1.4–7.7)
Neutrophils Relative %: 56.4 % (ref 43.0–77.0)
Platelets: 301 10*3/uL (ref 150.0–400.0)
RBC: 3.95 Mil/uL (ref 3.87–5.11)
RDW: 13.4 % (ref 11.5–15.5)
WBC: 6.7 10*3/uL (ref 4.0–10.5)

## 2021-04-20 LAB — BASIC METABOLIC PANEL
BUN: 17 mg/dL (ref 6–23)
CO2: 28 mEq/L (ref 19–32)
Calcium: 9.7 mg/dL (ref 8.4–10.5)
Chloride: 99 mEq/L (ref 96–112)
Creatinine, Ser: 0.66 mg/dL (ref 0.40–1.20)
GFR: 87.5 mL/min (ref >60.00)
Glucose, Bld: 103 mg/dL — ABNORMAL HIGH (ref 70–99)
Potassium: 4.8 mEq/L (ref 3.5–5.1)
Sodium: 135 mEq/L (ref 135–145)

## 2021-04-20 LAB — HEPATIC FUNCTION PANEL
ALT: 20 U/L (ref 0–35)
AST: 19 U/L (ref 0–37)
Albumin: 4.3 g/dL (ref 3.5–5.2)
Alkaline Phosphatase: 63 U/L (ref 39–117)
Bilirubin, Direct: 0.1 mg/dL (ref 0.0–0.3)
Total Bilirubin: 0.4 mg/dL (ref 0.2–1.2)
Total Protein: 7.5 g/dL (ref 6.0–8.3)

## 2021-04-20 LAB — VITAMIN D 25 HYDROXY (VIT D DEFICIENCY, FRACTURES): VITD: 32.09 ng/mL (ref 30.00–100.00)

## 2021-04-20 LAB — LDL CHOLESTEROL, DIRECT: Direct LDL: 190 mg/dL

## 2021-04-20 LAB — VITAMIN B12: Vitamin B-12: 192 pg/mL — ABNORMAL LOW (ref 211–911)

## 2021-04-20 LAB — HEMOGLOBIN A1C: Hgb A1c MFr Bld: 6 % (ref 4.6–6.5)

## 2021-04-20 LAB — TSH: TSH: 2.96 u[IU]/mL (ref 0.35–4.50)

## 2021-04-20 MED ORDER — BUPROPION HCL ER (XL) 150 MG PO TB24: 150.0000 mg | ORAL_TABLET | Freq: Every day | ORAL | 3 refills | Status: DC

## 2021-04-20 NOTE — Assessment & Plan Note (Signed)
Pt admits to increased use, denies w/d symptoms, will f/u next visit

## 2021-04-20 NOTE — Patient Instructions (Signed)
Please continue all other medications as before, and refills have been done if requested.  Please have the pharmacy call with any other refills you may need.  Please continue your efforts at being more active, low cholesterol diet, and weight control.  Please keep your appointments with your specialists as you may have planned     

## 2021-04-20 NOTE — Assessment & Plan Note (Signed)
Mild, likely viral , symptoms now imiproving , ok to continue to treat with mucinex bid prn,  to f/u any worsening symptoms or concerns

## 2021-04-20 NOTE — Assessment & Plan Note (Signed)
midl worsening recently, delcines change in tx today, will f/u at next visit

## 2021-04-20 NOTE — Patient Instructions (Signed)
Please take all new medication as prescribed - the wellbutrin xl 150 mg per day  Please call in 3 weeks if you would want the higher 300 mg dose  Please check your BP at home once per day, and call in 10 days with the average  Please continue all other medications as before, and refills have been done if requested.  Please have the pharmacy call with any other refills you may need.  Please continue your efforts at being more active, low cholesterol diet, and weight control.  You are otherwise up to date with prevention measures today.  Please keep your appointments with your specialists as you may have planned  Please go to the LAB at the blood drawing area for the tests to be done  You will be contacted by phone if any changes need to be made immediately.  Otherwise, you will receive a letter about your results with an explanation, but please check with MyChart first.  Please remember to sign up for MyChart if you have not done so, as this will be important to you in the future with finding out test results, communicating by private email, and scheduling acute appointments online when needed.  Please make an Appointment to return in 6 months, or sooner if needed

## 2021-04-20 NOTE — Assessment & Plan Note (Signed)
Lab Results  Component Value Date   TSH 2.10 07/24/2020   Stable, pt to continue levothyroxine, will f/u lab next visit

## 2021-04-20 NOTE — Progress Notes (Signed)
Patient ID: Cindy Higgins, female   DOB: [birth year], 74 y.o.   MRN: [id number]         Chief Complaint:: wellness exam and Diarrhea and Sore Throat , anxiety depression, B12, Vit d deficiency, ETOH use, hld, hyperglycemia       HPI:  Cindy Higgins is a 75 y.o. female here for wellness exam; declines covid booster, shingrix, ow up to date with preventive referrals and immunizations.                Also s/p bilateral cataract surgury but also glaucoma recent onset and interfered with postop vision now back to wearing glasses but cant read road signs well.  Sister #2 with breast ca, now s/p bilat mastectomy and pt taking care of her, but her husband died during her postop care with a fall and severe bleeding.  So much more stress lately, admits to much more ETOH recently to excess as well, did also travel recently with URI and diarrhea symptoms (covid neg) but fortunately that aspect from last wk has resolved yesterday,  but also has 4 purplish lesions come up for some reason tot he left mid forearm now improving, already has one lesion to right arm now improved as well without itching, or pain.  No other rash or lesions noted  BP ok at home < 140/90.  Admits to worsening depressive symptoms with low mood, hard to sleep, wt stable, anxiety and increased ETOH   Wt Readings from Last 3 Encounters:  04/20/21 141 lb (64 kg)  07/24/20 141 lb (64 kg)  01/14/20 143 lb (64.9 kg)   BP Readings from Last 3 Encounters:  04/20/21 (!) 164/88  07/24/20 136/84  01/14/20 (!) 142/80   Immunization History  Administered Date(s) Administered   Fluad Quad(high Dose 65+) 07/02/2019, 07/24/2020   Influenza Inj Mdck Quad Pf 09/10/2017   Influenza, High Dose Seasonal PF 12/09/2014, 08/10/2018, 06/28/2019   Influenza,inj,Quad PF,6+ Mos 08/16/2014, 08/21/2015   Influenza-Unspecified 11/09/2015, 08/08/2017   PFIZER(Purple Top)SARS-COV-2 Vaccination 01/14/2020, 02/13/2020   Pneumococcal Conjugate-13 12/09/2014,  08/21/2015   Pneumococcal Polysaccharide-23 08/20/2014   Td 11/17/2009   Tdap 07/24/2020   Unspecified SARS-COV-2 Vaccination 01/14/2020, 02/13/2020   Zoster, Live 05/10/2011   There are no preventive care reminders to display for this patient.     Past Medical History:  Diagnosis Date   ALLERGIC RHINITIS 12/22/2007   Anxiety    ANXIETY DEPRESSION 08/30/2007   Arthritis    Left thumb bone on bone   BLURRED VISION, INTERMITTENT 12/22/2007   COLONIC POLYPS, HX OF 12/22/2007   01/18/2006   Glaucoma    NTG Glaucoma   GLUCOSE INTOLERANCE 07/08/2009   Headache(784.0) 12/22/2007   Herpes zoster of eye 05/10/2011   HYPERCHOLESTEROLEMIA 08/30/2007   HYPERLIPIDEMIA 03/26/2008   Hypothyroidism 07/24/2020   Impaired glucose tolerance 05/09/2011   MITRAL VALVE PROLAPSE, HX OF 08/30/2007   OSTEOPOROSIS 12/22/2007   POSTMENOPAUSAL STATUS 08/30/2007   SINUSITIS- ACUTE-NOS 02/06/2010   Past Surgical History:  Procedure Laterality Date   ABDOMINAL HYSTERECTOMY     AUGMENTATION MAMMAPLASTY     COLONOSCOPY     COSMETIC SURGERY     DILATION AND CURETTAGE OF UTERUS  2007   OOPHORECTOMY     POLYPECTOMY      reports that she has never smoked. She has never used smokeless tobacco. She reports current alcohol use of about 2.0 standard drinks of alcohol per week. She reports that she does not use drugs. family history includes  Alcohol abuse in her father; Breast cancer in her maternal grandmother and sister; Cancer in her sister; Diabetes in her mother; Heart disease in her mother; Hyperlipidemia in her mother; Testicular cancer in her son. Allergies  Allergen Reactions   Sulfa Antibiotics Anaphylaxis   Other    Scallops [Shellfish Allergy] Nausea And Vomiting   Codeine Itching and Rash   Penicillins Itching    Has patient had a PCN reaction causing immediate rash, facial/tongue/throat swelling, SOB or lightheadedness with hypotension:  Unknown think it was rash Has patient had a PCN reaction causing  severe rash involving mucus membranes or skin necrosis: unknown childhood Has patient had a PCN reaction that required hospitalization: unknown Has patient had a PCN reaction occurring within the last 10 years: no childhood If all of the above answers are "NO", then may proceed with Cephalosporin use.     Current Outpatient Medications on File Prior to Visit  Medication Sig Dispense Refill   ALPRAZolam (XANAX) 0.5 MG tablet TAKE 1 TABLET BY MOUTH 2 TIMES DAILY AS NEEDED. 60 tablet 2   citalopram (CELEXA) 40 MG tablet TAKE 1 TABLET BY MOUTH EVERY DAY 90 tablet 1   estradiol (ESTRACE) 0.5 MG tablet Take 0.5 mg by mouth daily.     levothyroxine (SYNTHROID) 25 MCG tablet TAKE 1 TABLET BY MOUTH ONCE DAILY BEFORE BREAKFAST (Patient not taking: Reported on 04/20/2021) 90 tablet 2   Travoprost, BAK Free, (TRAVATAN) 0.004 % SOLN ophthalmic solution Place 1 drop into both eyes at bedtime. 5 mL 11   calcium-vitamin D (OSCAL WITH D) 250-125 MG-UNIT tablet Take 1 tablet by mouth daily. (Patient not taking: Reported on 04/20/2021)     cycloSPORINE (RESTASIS) 0.05 % ophthalmic emulsion Place 1 drop into both eyes 2 (two) times daily. (Patient not taking: Reported on 04/20/2021) 0.4 mL 11   fluorometholone (FML) 0.1 % ophthalmic suspension 1 drop every 4 (four) hours. (Patient not taking: No sig reported)     latanoprost (XALATAN) 0.005 % ophthalmic solution 1 drop at bedtime. (Patient not taking: No sig reported)     Loteprednol Etabonate (EYSUVIS) 0.25 % SUSP Apply 1 drop to eye 2 (two) times daily. OU (Patient not taking: Reported on 04/20/2021)     No current facility-administered medications on file prior to visit.        ROS:  All others reviewed and negative.  Objective        PE:  BP (!) 164/88   Pulse (!) 106   Ht 5\' 4"  (1.626 m)   Wt 141 lb (64 kg)   SpO2 97%   BMI 24.20 kg/m                 Constitutional: Pt appears in NAD               HENT: Head: NCAT.                Right Ear: External  ear normal.                 Left Ear: External ear normal.                Eyes: . Pupils are equal, round, and reactive to light. Conjunctivae and EOM are normal               Nose: without d/c or deformity               Neck: Neck supple. Gross normal ROM  Cardiovascular: Normal rate and regular rhythm.                 Pulmonary/Chest: Effort normal and breath sounds without rales or wheezing.                Abd:  Soft, NT, ND, + BS, no organomegaly               Neurological: Pt is alert. At baseline orientation, motor grossly intact               Skin: LE edema - none; 4 small round non raised purplish lesions at the left antecubital space non tender no swelling               Psychiatric: Pt behavior is normal without agitation, nervous with depressed affect   Micro: none  Cardiac tracings I have personally interpreted today:  none  Pertinent Radiological findings (summarize): none   Lab Results  Component Value Date   WBC 6.7 04/20/2021   HGB 13.4 04/20/2021   HCT 40.1 04/20/2021   PLT 301.0 04/20/2021   GLUCOSE 103 (H) 04/20/2021   CHOL 275 (H) 04/20/2021   TRIG 257.0 (H) 04/20/2021   HDL 68.90 04/20/2021   LDLDIRECT 190.0 04/20/2021   LDLCALC 130 (H) 01/16/2019   ALT 20 04/20/2021   AST 19 04/20/2021   NA 135 04/20/2021   K 4.8 04/20/2021   CL 99 04/20/2021   CREATININE 0.66 04/20/2021   BUN 17 04/20/2021   CO2 28 04/20/2021   TSH 2.96 04/20/2021   HGBA1C 6.0 04/20/2021   Assessment/Plan:  Cindy Higgins is a 75 y.o. White or Caucasian [1] female with  has a past medical history of ALLERGIC RHINITIS (12/22/2007), Anxiety, ANXIETY DEPRESSION (08/30/2007), Arthritis, BLURRED VISION, INTERMITTENT (12/22/2007), COLONIC POLYPS, HX OF (12/22/2007), Glaucoma, GLUCOSE INTOLERANCE (07/08/2009), Headache(784.0) (12/22/2007), Herpes zoster of eye (05/10/2011), HYPERCHOLESTEROLEMIA (08/30/2007), HYPERLIPIDEMIA (03/26/2008), Hypothyroidism (07/24/2020), Impaired glucose  tolerance (05/09/2011), MITRAL VALVE PROLAPSE, HX OF (08/30/2007), OSTEOPOROSIS (12/22/2007), POSTMENOPAUSAL STATUS (08/30/2007), and SINUSITIS- ACUTE-NOS (02/06/2010).  Encounter for well adult exam with abnormal findings Age and sex appropriate education and counseling updated with regular exercise and diet Referrals for preventative services - none needed Immunizations addressed - declines covid booster and shingrix Smoking counseling  - none needed Evidence for depression or other mood disorder - + worsening depression/anxiety Most recent labs reviewed. I have personally reviewed and have noted: 1) the patient's medical and social history 2) The patient's current medications and supplements 3) The patient's height, weight, and BMI have been recorded in the chart   Alcohol use Counseled to abstain, pt not ready  HYPERCHOLESTEROLEMIA Lab Results  Component Value Date   LDLCALC 130 (H) 01/16/2019   Stable, pt to continue current statin crestor 20 but recheck labs     ANXIETY DEPRESSION With mild uncontrolled, no SI or HI, for add wellbutrin xl 150, continue celexa 40, declines referral for counseling or psychiatry  Impaired glucose tolerance Lab Results  Component Value Date   HGBA1C 6.0 04/20/2021   Stable, pt to continue current medical treatment  - diet   Vitamin d deficiency Last vitamin D Lab Results  Component Value Date   VD25OH 32.09 04/20/2021   Low normal, to start oral replacement   Hypertension, uncontrolled Mild uncontrolled here, but pt reports < 14090 at home decliens further med change, to continue to monitor daily at home for 10 days and call with results  Cobalamin deficiency Lab Results  Component Value  Date   VITAMINB12 192 (L) 04/20/2021   Low, to start oral replacement - b12 1000 mcg qd  Followup: Return in about 6 months (around 10/20/2021).  Cathlean Cower, MD 04/24/2021 8:55 PM [facility]

## 2021-04-20 NOTE — Assessment & Plan Note (Signed)
Lab Results  Component Value Date   HGBA1C 5.6 01/14/2020   Stable, pt to continue current medical treatment  - diet

## 2021-04-21 ENCOUNTER — Encounter: Payer: Self-pay | Admitting: Internal Medicine

## 2021-04-21 ENCOUNTER — Other Ambulatory Visit: Payer: Self-pay | Admitting: Internal Medicine

## 2021-04-21 LAB — URINALYSIS, ROUTINE W REFLEX MICROSCOPIC
Bilirubin Urine: NEGATIVE
Hgb urine dipstick: NEGATIVE
Ketones, ur: NEGATIVE
Leukocytes,Ua: NEGATIVE
Nitrite: NEGATIVE
RBC / HPF: NONE SEEN (ref 0–?)
Specific Gravity, Urine: 1.025 (ref 1.000–1.030)
Total Protein, Urine: NEGATIVE
Urine Glucose: NEGATIVE
Urobilinogen, UA: 0.2 (ref 0.0–1.0)
pH: 5.5 (ref 5.0–8.0)

## 2021-04-21 MED ORDER — ROSUVASTATIN CALCIUM 20 MG PO TABS
20.0000 mg | ORAL_TABLET | Freq: Every day | ORAL | 3 refills | Status: DC
Start: 2021-04-21 — End: 2021-10-20

## 2021-04-21 MED ORDER — VITAMIN B-12 1000 MCG PO TABS: 1000.0000 ug | ORAL_TABLET | Freq: Every day | ORAL | 3 refills | Status: DC

## 2021-04-24 ENCOUNTER — Encounter: Payer: Self-pay | Admitting: Internal Medicine

## 2021-04-24 NOTE — Assessment & Plan Note (Signed)
Lab Results  Component Value Date   HGBA1C 6.0 04/20/2021   Stable, pt to continue current medical treatment  - diet

## 2021-04-24 NOTE — Assessment & Plan Note (Signed)
Mild uncontrolled here, but pt reports < 14090 at home decliens further med change, to continue to monitor daily at home for 10 days and call with results

## 2021-04-24 NOTE — Assessment & Plan Note (Signed)
Lab Results  Component Value Date   VITAMINB12 192 (L) 04/20/2021   Low, to start oral replacement - b12 1000 mcg qd

## 2021-04-24 NOTE — Assessment & Plan Note (Signed)
Last vitamin D Lab Results  Component Value Date   VD25OH 32.09 04/20/2021   Low normal, to start oral replacement

## 2021-04-24 NOTE — Assessment & Plan Note (Signed)
With mild uncontrolled, no SI or HI, for add wellbutrin xl 150, continue celexa 40, declines referral for counseling or psychiatry

## 2021-04-24 NOTE — Assessment & Plan Note (Signed)
Lab Results  Component Value Date   LDLCALC 130 (H) 01/16/2019   Stable, pt to continue current statin crestor 20 but recheck labs

## 2021-04-24 NOTE — Assessment & Plan Note (Signed)
Age and sex appropriate education and counseling updated with regular exercise and diet Referrals for preventative services - none needed Immunizations addressed - declines covid booster and shingrix Smoking counseling  - none needed Evidence for depression or other mood disorder - + worsening depression/anxiety Most recent labs reviewed. I have personally reviewed and have noted: 1) the patient's medical and social history 2) The patient's current medications and supplements 3) The patient's height, weight, and BMI have been recorded in the chart

## 2021-04-24 NOTE — Assessment & Plan Note (Signed)
Counseled to abstain, pt not ready

## 2021-05-08 ENCOUNTER — Encounter: Payer: Self-pay | Admitting: Internal Medicine

## 2021-05-18 DIAGNOSIS — H401211 Low-tension glaucoma, right eye, mild stage: Secondary | ICD-10-CM | POA: Diagnosis not present

## 2021-05-18 DIAGNOSIS — H04123 Dry eye syndrome of bilateral lacrimal glands: Secondary | ICD-10-CM | POA: Diagnosis not present

## 2021-05-20 ENCOUNTER — Other Ambulatory Visit: Payer: Self-pay | Admitting: Obstetrics and Gynecology

## 2021-05-20 DIAGNOSIS — Z1231 Encounter for screening mammogram for malignant neoplasm of breast: Secondary | ICD-10-CM

## 2021-06-01 ENCOUNTER — Encounter (INDEPENDENT_AMBULATORY_CARE_PROVIDER_SITE_OTHER): Payer: Medicare HMO | Admitting: Ophthalmology

## 2021-06-15 ENCOUNTER — Encounter (INDEPENDENT_AMBULATORY_CARE_PROVIDER_SITE_OTHER): Payer: Medicare HMO | Admitting: Ophthalmology

## 2021-07-03 ENCOUNTER — Encounter (INDEPENDENT_AMBULATORY_CARE_PROVIDER_SITE_OTHER): Payer: Self-pay

## 2021-07-03 DIAGNOSIS — H04123 Dry eye syndrome of bilateral lacrimal glands: Secondary | ICD-10-CM | POA: Diagnosis not present

## 2021-07-03 DIAGNOSIS — H35371 Puckering of macula, right eye: Secondary | ICD-10-CM | POA: Diagnosis not present

## 2021-07-03 DIAGNOSIS — H401211 Low-tension glaucoma, right eye, mild stage: Secondary | ICD-10-CM | POA: Diagnosis not present

## 2021-07-06 ENCOUNTER — Other Ambulatory Visit: Payer: Self-pay

## 2021-07-06 ENCOUNTER — Ambulatory Visit (INDEPENDENT_AMBULATORY_CARE_PROVIDER_SITE_OTHER): Payer: Medicare HMO

## 2021-07-06 VITALS — BP 120/60 | HR 81 | Temp 98.3°F | Ht 64.0 in | Wt 141.6 lb

## 2021-07-06 DIAGNOSIS — Z0000 Encounter for general adult medical examination without abnormal findings: Secondary | ICD-10-CM

## 2021-07-06 NOTE — Patient Instructions (Addendum)
[name] , Thank you for taking time to come for your Medicare Wellness Visit. I appreciate your ongoing commitment to your health goals. Please review the following plan we discussed and let me know if I can assist you in the future.   Screening recommendations/referrals: Colonoscopy: last done 05/18/2013; due every 10 years Mammogram: last done 06/13/2020; scheduled for 07/15/2021 Bone Density: last done 04/10/2018; due every 2 years Recommended yearly ophthalmology/optometry visit for glaucoma screening and checkup Recommended yearly dental visit for hygiene and checkup  Vaccinations: Influenza vaccine: 07/24/2020 Pneumococcal vaccine: 08/20/2014, 08/21/2015 Tdap vaccine: 07/24/2020; due every 10 years Shingles vaccine: allergic reaction Covid-19: 01/14/2020, 02/13/2020, 09/27/2020  Advanced directives: Please bring a copy of your health care power of attorney and living will to the office at your convenience.  Conditions/risks identified: Yes; Client understands the importance of follow-up with providers by attending scheduled visits and discussed goals to eat healthier, increase physical activity, exercise the brain, socialize more, get enough sleep and make time for laughter.  Next appointment: Please schedule your next Medicare Wellness Visit with your Nurse Health Advisor in 1 year by calling [phone].   Preventive Care 44 Years and Older, Female Preventive care refers to lifestyle choices and visits with your health care provider that can promote health and wellness. What does preventive care include? A yearly physical exam. This is also called an annual well check. Dental exams once or twice a year. Routine eye exams. Ask your health care provider how often you should have your eyes checked. Personal lifestyle choices, including: Daily care of your teeth and gums. Regular physical activity. Eating a healthy diet. Avoiding tobacco and drug use. Limiting alcohol use. Practicing  safe sex. Taking low-dose aspirin every day. Taking vitamin and mineral supplements as recommended by your health care provider. What happens during an annual well check? The services and screenings done by your health care provider during your annual well check will depend on your age, overall health, lifestyle risk factors, and family history of disease. Counseling  Your health care provider may ask you questions about your: Alcohol use. Tobacco use. Drug use. Emotional well-being. Home and relationship well-being. Sexual activity. Eating habits. History of falls. Memory and ability to understand (cognition). Work and work Statistician. Reproductive health. Screening  You may have the following tests or measurements: Height, weight, and BMI. Blood pressure. Lipid and cholesterol levels. These may be checked every 5 years, or more frequently if you are over 34 years old. Skin check. Lung cancer screening. You may have this screening every year starting at age 13 if you have a 30-pack-year history of smoking and currently smoke or have quit within the past 15 years. Fecal occult blood test (FOBT) of the stool. You may have this test every year starting at age 77. Flexible sigmoidoscopy or colonoscopy. You may have a sigmoidoscopy every 5 years or a colonoscopy every 10 years starting at age 38. Hepatitis C blood test. Hepatitis B blood test. Sexually transmitted disease (STD) testing. Diabetes screening. This is done by checking your blood sugar (glucose) after you have not eaten for a while (fasting). You may have this done every 1-3 years. Bone density scan. This is done to screen for osteoporosis. You may have this done starting at age 73. Mammogram. This may be done every 1-2 years. Talk to your health care provider about how often you should have regular mammograms. Talk with your health care provider about your test results, treatment options, and if necessary, the  need for more  tests. Vaccines  Your health care provider may recommend certain vaccines, such as: Influenza vaccine. This is recommended every year. Tetanus, diphtheria, and acellular pertussis (Tdap, Td) vaccine. You may need a Td booster every 10 years. Zoster vaccine. You may need this after age 21. Pneumococcal 13-valent conjugate (PCV13) vaccine. One dose is recommended after age 16. Pneumococcal polysaccharide (PPSV23) vaccine. One dose is recommended after age 26. Talk to your health care provider about which screenings and vaccines you need and how often you need them. This information is not intended to replace advice given to you by your health care provider. Make sure you discuss any questions you have with your health care provider. Document Released: 11/21/2015 Document Revised: 07/14/2016 Document Reviewed: 08/26/2015 Elsevier Interactive Patient Education  2017 [place] Prevention in the Home Falls can cause injuries. They can happen to people of all ages. There are many things you can do to make your home safe and to help prevent falls. What can I do on the outside of my home? Regularly fix the edges of walkways and driveways and fix any cracks. Remove anything that might make you trip as you walk through a door, such as a raised step or threshold. Trim any bushes or trees on the path to your home. Use bright outdoor lighting. Clear any walking paths of anything that might make someone trip, such as rocks or tools. Regularly check to see if handrails are loose or broken. Make sure that both sides of any steps have handrails. Any raised decks and porches should have guardrails on the edges. Have any leaves, snow, or ice cleared regularly. Use sand or salt on walking paths during winter. Clean up any spills in your garage right away. This includes oil or grease spills. What can I do in the bathroom? Use night lights. Install grab bars by the toilet and in the tub and shower.  Do not use towel bars as grab bars. Use non-skid mats or decals in the tub or shower. If you need to sit down in the shower, use a plastic, non-slip stool. Keep the floor dry. Clean up any water that spills on the floor as soon as it happens. Remove soap buildup in the tub or shower regularly. Attach bath mats securely with double-sided non-slip rug tape. Do not have throw rugs and other things on the floor that can make you trip. What can I do in the bedroom? Use night lights. Make sure that you have a light by your bed that is easy to reach. Do not use any sheets or blankets that are too big for your bed. They should not hang down onto the floor. Have a firm chair that has side arms. You can use this for support while you get dressed. Do not have throw rugs and other things on the floor that can make you trip. What can I do in the kitchen? Clean up any spills right away. Avoid walking on wet floors. Keep items that you use a lot in easy-to-reach places. If you need to reach something above you, use a strong step stool that has a grab bar. Keep electrical cords out of the way. Do not use floor polish or wax that makes floors slippery. If you must use wax, use non-skid floor wax. Do not have throw rugs and other things on the floor that can make you trip. What can I do with my stairs? Do not leave any items on the  stairs. Make sure that there are handrails on both sides of the stairs and use them. Fix handrails that are broken or loose. Make sure that handrails are as long as the stairways. Check any carpeting to make sure that it is firmly attached to the stairs. Fix any carpet that is loose or worn. Avoid having throw rugs at the top or bottom of the stairs. If you do have throw rugs, attach them to the floor with carpet tape. Make sure that you have a light switch at the top of the stairs and the bottom of the stairs. If you do not have them, ask someone to add them for you. What else  can I do to help prevent falls? Wear shoes that: Do not have high heels. Have rubber bottoms. Are comfortable and fit you well. Are closed at the toe. Do not wear sandals. If you use a stepladder: Make sure that it is fully opened. Do not climb a closed stepladder. Make sure that both sides of the stepladder are locked into place. Ask someone to hold it for you, if possible. Clearly mark and make sure that you can see: Any grab bars or handrails. First and last steps. Where the edge of each step is. Use tools that help you move around (mobility aids) if they are needed. These include: Canes. Walkers. Scooters. Crutches. Turn on the lights when you go into a dark area. Replace any light bulbs as soon as they burn out. Set up your furniture so you have a clear path. Avoid moving your furniture around. If any of your floors are uneven, fix them. If there are any pets around you, be aware of where they are. Review your medicines with your doctor. Some medicines can make you feel dizzy. This can increase your chance of falling. Ask your doctor what other things that you can do to help prevent falls. This information is not intended to replace advice given to you by your health care provider. Make sure you discuss any questions you have with your health care provider. Document Released: 08/21/2009 Document Revised: 04/01/2016 Document Reviewed: 11/29/2014 Elsevier Interactive Patient Education  2017 [employer].

## 2021-07-06 NOTE — Progress Notes (Signed)
Subjective:   Cindy Higgins is a 75 y.o. female who presents for Medicare Annual (Subsequent) preventive examination.  Review of Systems     Cardiac Risk Factors include: advanced age (>64mn, >>2women);dyslipidemia;family history of premature cardiovascular disease;hypertension     Objective:    Today's Vitals   07/06/21 1551  BP: 120/60  Pulse: 81  Temp: 98.3 F (36.8 C)  SpO2: 97%  Weight: 141 lb 9.6 oz (64.2 kg)  Height: '5\' 4"'$  (1.626 m)  PainSc: 0-No pain   Body mass index is 24.31 kg/m.  Advanced Directives 07/06/2021 12/10/2016  Does Patient Have a Medical Advance Directive? Yes No  Type of Advance Directive HGeraldine-  Does patient want to make changes to medical advance directive? No - Patient declined -  Would patient like information on creating a medical advance directive? - No - Patient declined    Current Medications (verified) Outpatient Encounter Medications as of 07/06/2021  Medication Sig   ALPRAZolam (XANAX) 0.5 MG tablet TAKE 1 TABLET BY MOUTH 2 TIMES DAILY AS NEEDED.   brimonidine-timolol (COMBIGAN) 0.2-0.5 % ophthalmic solution Place 1 drop into both eyes every 12 (twelve) hours.   citalopram (CELEXA) 40 MG tablet TAKE 1 TABLET BY MOUTH EVERY DAY   estradiol (ESTRACE) 0.5 MG tablet Take 0.5 mg by mouth daily.   levothyroxine (SYNTHROID) 25 MCG tablet TAKE 1 TABLET BY MOUTH ONCE DAILY BEFORE BREAKFAST   vitamin B-12 (CYANOCOBALAMIN) 1000 MCG tablet Take 1 tablet (1,000 mcg total) by mouth daily.   buPROPion (WELLBUTRIN XL) 150 MG 24 hr tablet Take 1 tablet (150 mg total) by mouth daily. (Patient not taking: Reported on 07/06/2021)   calcium-vitamin D (OSCAL WITH D) 250-125 MG-UNIT tablet Take 1 tablet by mouth daily. (Patient not taking: Reported on 04/20/2021)   cycloSPORINE (RESTASIS) 0.05 % ophthalmic emulsion Place 1 drop into both eyes 2 (two) times daily. (Patient not taking: Reported on 04/20/2021)   fluorometholone (FML) 0.1 %  ophthalmic suspension 1 drop every 4 (four) hours. (Patient not taking: No sig reported)   latanoprost (XALATAN) 0.005 % ophthalmic solution 1 drop at bedtime. (Patient not taking: No sig reported)   Loteprednol Etabonate (EYSUVIS) 0.25 % SUSP Apply 1 drop to eye 2 (two) times daily. OU (Patient not taking: Reported on 04/20/2021)   rosuvastatin (CRESTOR) 20 MG tablet Take 1 tablet (20 mg total) by mouth daily. (Patient not taking: Reported on 07/06/2021)   Travoprost, BAK Free, (TRAVATAN) 0.004 % SOLN ophthalmic solution Place 1 drop into both eyes at bedtime. (Patient not taking: Reported on 07/06/2021)   No facility-administered encounter medications on file as of 07/06/2021.    Allergies (verified) Sulfa antibiotics, Other, Scallops [shellfish allergy], Codeine, and Penicillins   History: Past Medical History:  Diagnosis Date   ALLERGIC RHINITIS 12/22/2007   Anxiety    ANXIETY DEPRESSION 08/30/2007   Arthritis    Left thumb bone on bone   BLURRED VISION, INTERMITTENT 12/22/2007   COLONIC POLYPS, HX OF 12/22/2007   01/18/2006   Glaucoma    NTG Glaucoma   GLUCOSE INTOLERANCE 07/08/2009   Headache(784.0) 12/22/2007   Herpes zoster of eye 05/10/2011   HYPERCHOLESTEROLEMIA 08/30/2007   HYPERLIPIDEMIA 03/26/2008   Hypothyroidism 07/24/2020   Impaired glucose tolerance 05/09/2011   MITRAL VALVE PROLAPSE, HX OF 08/30/2007   OSTEOPOROSIS 12/22/2007   POSTMENOPAUSAL STATUS 08/30/2007   SINUSITIS- ACUTE-NOS 02/06/2010   Past Surgical History:  Procedure Laterality Date   ABDOMINAL HYSTERECTOMY  AUGMENTATION MAMMAPLASTY     COLONOSCOPY     COSMETIC SURGERY     DILATION AND CURETTAGE OF UTERUS  2007   OOPHORECTOMY     POLYPECTOMY     Family History  Problem Relation Age of Onset   Breast cancer Sister    Cancer Sister    Heart disease Mother    Diabetes Mother    Hyperlipidemia Mother    Alcohol abuse Father    Testicular cancer Son    Breast cancer Maternal Grandmother    Social  History   Socioeconomic History   Marital status: Married    Spouse name: Not on file   Number of children: 2   Years of education: Not on file   Highest education level: Not on file  Occupational History   Occupation: Retired  Tobacco Use   Smoking status: Never   Smokeless tobacco: Never  Substance and Sexual Activity   Alcohol use: Yes    Alcohol/week: 2.0 standard drinks    Types: 2 drink(s) per week    Comment: daily   Drug use: No   Sexual activity: Yes  Other Topics Concern   Not on file  Social History Narrative   Not on file   Social Determinants of Health   Financial Resource Strain: Low Risk    Difficulty of Paying Living Expenses: Not hard at all  Food Insecurity: No Food Insecurity   Worried About Charity fundraiser in the Last Year: Never true   [place] in the Last Year: Never true  Transportation Needs: No Transportation Needs   Lack of Transportation (Medical): No   Lack of Transportation (Non-Medical): No  Physical Activity: Sufficiently Active   Days of Exercise per Week: 5 days   Minutes of Exercise per Session: 30 min  Stress: No Stress Concern Present   Feeling of Stress : Not at all  Social Connections: Socially Integrated   Frequency of Communication with Friends and Family: More than three times a week   Frequency of Social Gatherings with Friends and Family: Once a week   Attends Religious Services: 1 to 4 times per year   Active Member of [employer] or Organizations: Yes   Attends Archivist Meetings: 1 to 4 times per year   Marital Status: Married    Tobacco Counseling Counseling given: Not Answered   Clinical Intake:  Pre-visit preparation completed: Yes  Pain : No/denies pain Pain Score: 0-No pain     BMI - recorded: 24.31 Nutritional Status: BMI of 19-24  Normal Nutritional Risks: None Diabetes: No  How often do you need to have someone help you when you read instructions, pamphlets, or other written  materials from your doctor or pharmacy?: 1 - Never What is the last grade level you completed in school?: 1 year of college  Diabetic? no  Interpreter Needed?: No  Information entered by :: Lisette Abu, LPN   Activities of Daily Living In your present state of health, do you have any difficulty performing the following activities: 07/06/2021 04/20/2021  Hearing? N N  Vision? Y N  Difficulty concentrating or making decisions? N N  Walking or climbing stairs? N N  Dressing or bathing? N N  Doing errands, shopping? N N  Preparing Food and eating ? N -  Using the Toilet? N -  In the past six months, have you accidently leaked urine? N -  Do you have problems with loss of bowel control? N -  Managing your Medications? N -  Managing your Finances? N -  Housekeeping or managing your Housekeeping? N -  Some recent data might be hidden    Patient Care Team: Biagio Borg, MD as PCP - General Rankin, Clent Demark, MD as Consulting Physician (Ophthalmology) Hortencia Pilar, MD as Consulting Physician (Ophthalmology)  Indicate any recent Medical Services you may have received from other than [facility] providers in the past year (date may be approximate).     Assessment:   This is a routine wellness examination for Carlene.  Hearing/Vision screen Hearing Screening - Comments:: Patient denied any hearing difficulty. Vision Screening - Comments:: Patient wears glasses.  Eye exam done every 3-6 months for retinal detachment by Dr. Marshall Cork and Dr. Deloria Lair.  Dietary issues and exercise activities discussed: Current Exercise Habits: Home exercise routine, Type of exercise: Other - see comments;stretching;walking (video exercises), Time (Minutes): 30, Frequency (Times/Week): 5, Weekly Exercise (Minutes/Week): 150, Intensity: Moderate, Exercise limited by: None identified   Goals Addressed               This Visit's Progress     Patient Stated (pt-stated)        My goal  is to lose 10-15 pounds.      Depression Screen PHQ 2/9 Scores 07/06/2021 04/20/2021 04/20/2021 07/24/2020 01/14/2020 01/09/2019 10/14/2016  PHQ - 2 Score 2 0 2 0 1 0 1  PHQ- 9 Score - 0 - - - - -    Fall Risk Fall Risk  07/06/2021 04/20/2021 07/24/2020 01/14/2020 01/09/2019  Falls in the past year? 0 0 0 1 0  Number falls in past yr: 0 0 - 0 -  Comment - - - - -  Injury with Fall? 0 0 - 0 -  Risk for fall due to : No Fall Risks - - - -  Follow up Falls evaluation completed - - - -    FALL RISK PREVENTION PERTAINING TO THE HOME:  Any stairs in or around the home? Yes  If so, are there any without handrails? No  Home free of loose throw rugs in walkways, pet beds, electrical cords, etc? Yes  Adequate lighting in your home to reduce risk of falls? Yes   ASSISTIVE DEVICES UTILIZED TO PREVENT FALLS:  Life alert? No  Use of a cane, walker or w/c? No  Grab bars in the bathroom? Yes  Shower chair or bench in shower? No  Elevated toilet seat or a handicapped toilet? No   TIMED UP AND GO:  Was the test performed? Yes .  Length of time to ambulate 10 feet: 6 sec.   Gait steady and fast without use of assistive device  Cognitive Function: Normal cognitive status assessed by direct observation by this Nurse Health Advisor. No abnormalities found.          Immunizations Immunization History  Administered Date(s) Administered   Fluad Quad(high Dose 65+) 07/02/2019, 07/24/2020   Influenza Inj Mdck Quad Pf 09/10/2017   Influenza, High Dose Seasonal PF 12/09/2014, 08/10/2018, 06/28/2019   Influenza,inj,Quad PF,6+ Mos 08/16/2014, 08/21/2015   Influenza-Unspecified 11/09/2015, 08/08/2017   PFIZER(Purple Top)SARS-COV-2 Vaccination 01/14/2020, 02/13/2020, 09/27/2020   Pneumococcal Conjugate-13 12/09/2014, 08/21/2015   Pneumococcal Polysaccharide-23 08/20/2014   Td 11/17/2009   Tdap 07/24/2020   Unspecified SARS-COV-2 Vaccination 01/14/2020, 02/13/2020   Zoster Recombinat (Shingrix) 06/08/2021    Zoster, Live 05/10/2011    TDAP status: Up to date  Flu Vaccine status: Up to date  Pneumococcal vaccine status: Up  to date  Covid-19 vaccine status: Completed vaccines  Qualifies for Shingles Vaccine? Yes   Zostavax completed Yes   Shingrix Completed?: No.    Education has been provided regarding the importance of this vaccine. Patient has been advised to call insurance company to determine out of pocket expense if they have not yet received this vaccine. Advised may also receive vaccine at local pharmacy or Health Dept. Verbalized acceptance and understanding. Patient had an allergic reaction to vaccine.  Screening Tests Health Maintenance  Topic Date Due   COVID-19 Vaccine (4 - Booster for Pfizer series) 01/25/2021   INFLUENZA VACCINE  06/08/2021   MAMMOGRAM  06/13/2022   COLONOSCOPY (Pts 45-12yr Insurance coverage will need to be confirmed)  05/19/2023   TETANUS/TDAP  07/24/2030   DEXA SCAN  Completed   Hepatitis C Screening  Completed   PNA vac Low Risk Adult  Completed   HPV VACCINES  Aged Out   Zoster Vaccines- Shingrix  Discontinued    Health Maintenance  Health Maintenance Due  Topic Date Due   COVID-19 Vaccine (4 - Booster for PJedditoseries) 01/25/2021   INFLUENZA VACCINE  06/08/2021    Colorectal cancer screening: Type of screening: Colonoscopy. Completed 05/18/2013. Repeat every 10 years  Mammogram status: Completed 06/13/2020. Repeat every year (Scheduled for 07/15/2021)  Bone Density status: Completed 04/10/2018. Results reflect: Bone density results: OSTEOPENIA. Repeat every 2-3 years.  Lung Cancer Screening: (Low Dose CT Chest recommended if Age 73-80years, 30 pack-year currently smoking OR have quit w/in 15years.) does not qualify.   Lung Cancer Screening Referral: no  Additional Screening:  Hepatitis C Screening: does qualify; Completed yes  Vision Screening: Recommended annual ophthalmology exams for early detection of glaucoma and other disorders  of the eye. Is the patient up to date with their annual eye exam?  Yes  Who is the provider or what is the name of the office in which the patient attends annual eye exams? Dr. GDeloria Lairand Dr. CMarshall CorkIf pt is not established with a provider, would they like to be referred to a provider to establish care? No .   Dental Screening: Recommended annual dental exams for proper oral hygiene  Community Resource Referral / Chronic Care Management: CRR required this visit?  No   CCM required this visit?  No      Plan:     I have personally reviewed and noted the following in the patient's chart:   Medical and social history Use of alcohol, tobacco or illicit drugs  Current medications and supplements including opioid prescriptions.  Functional ability and status Nutritional status Physical activity Advanced directives List of other physicians Hospitalizations, surgeries, and ER visits in previous 12 months Vitals Screenings to include cognitive, depression, and falls Referrals and appointments  In addition, I have reviewed and discussed with patient certain preventive protocols, quality metrics, and best practice recommendations. A written personalized care plan for preventive services as well as general preventive health recommendations were provided to patient.     SSheral Flow LPN   [id number]  Nurse Notes:  Hearing Screening - Comments:: Patient denied any hearing difficulty. Vision Screening - Comments:: Patient wears glasses.  Eye exam done every 3-6 months for retinal detachment by Dr. CMarshall Corkand Dr. GDeloria Lair

## 2021-07-09 ENCOUNTER — Encounter (INDEPENDENT_AMBULATORY_CARE_PROVIDER_SITE_OTHER): Payer: Self-pay | Admitting: Ophthalmology

## 2021-07-09 ENCOUNTER — Other Ambulatory Visit: Payer: Self-pay

## 2021-07-09 ENCOUNTER — Ambulatory Visit (INDEPENDENT_AMBULATORY_CARE_PROVIDER_SITE_OTHER): Payer: Medicare HMO | Admitting: Ophthalmology

## 2021-07-09 DIAGNOSIS — H35371 Puckering of macula, right eye: Secondary | ICD-10-CM | POA: Diagnosis not present

## 2021-07-09 DIAGNOSIS — H401212 Low-tension glaucoma, right eye, moderate stage: Secondary | ICD-10-CM | POA: Diagnosis not present

## 2021-07-09 NOTE — Assessment & Plan Note (Signed)
Under the care of Dr. Marshall Cork, follow-up glaucoma with Dr. Kathlen Mody and [facility]

## 2021-07-09 NOTE — Assessment & Plan Note (Signed)
Severe epiretinal membrane, macular thickening accounts for acuity OD  I will review with the patient and husband that the only treatment for this condition is observation with no chance visual acuity improvement versus surgical intervention whereby procedure lasting 12 to 18 minutes can successfully remove this area and and over the weeks to months thereafter visual acuity can only stabilize but more importantly have a 95% chance of improvement

## 2021-07-09 NOTE — Progress Notes (Signed)
07/09/2021     CHIEF COMPLAINT Patient presents for No chief complaint on file.     HISTORY OF PRESENT ILLNESS: Cindy Higgins is a 75 y.o. female who presents to the clinic today for:   HPI   NP- ERM eval OD- referred by by Dr. Read Drivers (1-2 weeks- 2nd opinion). Pt states "I went into cataract surgery for right eye, on day 2 after surgery my pupil looked like a football, due to pressure rising it - so he took an instrument and made it round again. It pushed the lens out so he clipped the part of the lens that Higgins slightly out. Right eye is distance, left eye is monovision. I had perfect vision for four months and then all of a sudden my distance vision changed." Pt states she is using [place] ou bid.  Pt states she is no longer using latanoprost or travatan due to irritation.  Pt states she also had laser surgery on the right eye. S She wanted a 2nd opinion because Dr Coralyn Pear, "the other retinal specialist Higgins not aggressive enough."  Last edited by Laurin Coder on 07/09/2021  2:56 PM.      Referring physician: Biagio Borg, MD [place],  [place] [id number]  HISTORICAL INFORMATION:   Selected notes from the MEDICAL RECORD NUMBER    Lab Results  Component Value Date   HGBA1C 6.0 04/20/2021     CURRENT MEDICATIONS: Current Outpatient Medications (Ophthalmic Drugs)  Medication Sig   brimonidine-timolol (COMBIGAN) 0.2-0.5 % ophthalmic solution Place 1 drop into both eyes every 12 (twelve) hours.   cycloSPORINE (RESTASIS) 0.05 % ophthalmic emulsion Place 1 drop into both eyes 2 (two) times daily. (Patient not taking: Reported on 04/20/2021)   fluorometholone (FML) 0.1 % ophthalmic suspension 1 drop every 4 (four) hours. (Patient not taking: No sig reported)   latanoprost (XALATAN) 0.005 % ophthalmic solution 1 drop at bedtime. (Patient not taking: No sig reported)   Loteprednol Etabonate (EYSUVIS) 0.25 % SUSP Apply 1 drop to eye 2 (two) times daily. OU (Patient  not taking: Reported on 04/20/2021)   Travoprost, BAK Free, (TRAVATAN) 0.004 % SOLN ophthalmic solution Place 1 drop into both eyes at bedtime. (Patient not taking: Reported on 07/06/2021)   No current facility-administered medications for this visit. (Ophthalmic Drugs)   Current Outpatient Medications (Other)  Medication Sig   ALPRAZolam (XANAX) 0.5 MG tablet TAKE 1 TABLET BY MOUTH 2 TIMES DAILY AS NEEDED.   buPROPion (WELLBUTRIN XL) 150 MG 24 hr tablet Take 1 tablet (150 mg total) by mouth daily. (Patient not taking: Reported on 07/06/2021)   calcium-vitamin D (OSCAL WITH D) 250-125 MG-UNIT tablet Take 1 tablet by mouth daily. (Patient not taking: Reported on 04/20/2021)   citalopram (CELEXA) 40 MG tablet TAKE 1 TABLET BY MOUTH EVERY DAY   estradiol (ESTRACE) 0.5 MG tablet Take 0.5 mg by mouth daily.   levothyroxine (SYNTHROID) 25 MCG tablet TAKE 1 TABLET BY MOUTH ONCE DAILY BEFORE BREAKFAST   rosuvastatin (CRESTOR) 20 MG tablet Take 1 tablet (20 mg total) by mouth daily. (Patient not taking: Reported on 07/06/2021)   vitamin B-12 (CYANOCOBALAMIN) 1000 MCG tablet Take 1 tablet (1,000 mcg total) by mouth daily.   No current facility-administered medications for this visit. (Other)      REVIEW OF SYSTEMS:    ALLERGIES Allergies  Allergen Reactions   Sulfa Antibiotics Anaphylaxis   Other    Scallops [Shellfish Allergy] Nausea And Vomiting  Codeine Itching and Rash   Penicillins Itching    Has patient had a PCN reaction causing immediate rash, facial/tongue/throat swelling, SOB or lightheadedness with hypotension:  Unknown think it Higgins rash Has patient had a PCN reaction causing severe rash involving mucus membranes or skin necrosis: unknown childhood Has patient had a PCN reaction that required hospitalization: unknown Has patient had a PCN reaction occurring within the last 10 years: no childhood If all of the above answers are "NO", then may proceed with Cephalosporin use.       PAST MEDICAL HISTORY Past Medical History:  Diagnosis Date   ALLERGIC RHINITIS 12/22/2007   Anxiety    ANXIETY DEPRESSION 08/30/2007   Arthritis    Left thumb bone on bone   BLURRED VISION, INTERMITTENT 12/22/2007   COLONIC POLYPS, HX OF 12/22/2007   01/18/2006   Glaucoma    NTG Glaucoma   GLUCOSE INTOLERANCE 07/08/2009   Headache(784.0) 12/22/2007   Herpes zoster of eye 05/10/2011   HYPERCHOLESTEROLEMIA 08/30/2007   HYPERLIPIDEMIA 03/26/2008   Hypothyroidism 07/24/2020   Impaired glucose tolerance 05/09/2011   MITRAL VALVE PROLAPSE, HX OF 08/30/2007   OSTEOPOROSIS 12/22/2007   POSTMENOPAUSAL STATUS 08/30/2007   SINUSITIS- ACUTE-NOS 02/06/2010   Past Surgical History:  Procedure Laterality Date   ABDOMINAL HYSTERECTOMY     AUGMENTATION MAMMAPLASTY     COLONOSCOPY     COSMETIC SURGERY     DILATION AND CURETTAGE OF UTERUS  2007   OOPHORECTOMY     POLYPECTOMY      FAMILY HISTORY Family History  Problem Relation Age of Onset   Breast cancer Sister    Cancer Sister    Heart disease Mother    Diabetes Mother    Hyperlipidemia Mother    Alcohol abuse Father    Testicular cancer Son    Breast cancer Maternal Grandmother     SOCIAL HISTORY Social History   Tobacco Use   Smoking status: Never   Smokeless tobacco: Never  Substance Use Topics   Alcohol use: Yes    Alcohol/week: 2.0 standard drinks    Types: 2 drink(s) per week    Comment: daily   Drug use: No         OPHTHALMIC EXAM:  Base Eye Exam     Visual Acuity (ETDRS)       Right Left   Dist cc 20/40 -2+2 20/30 -1   Dist ph cc NI   OD PH: "all I see are squiggles."        Tonometry (Tonopen, 3:03 PM)       Right Left   Pressure 7 7         Pupils       Pupils Dark Light Shape React APD   Right PERRL 4 3 Round Brisk None   Left PERRL 4 3 Round Brisk None         Visual Fields (Counting fingers)       Left Right    Full Full         Extraocular Movement       Right Left     Full Full         Neuro/Psych     Oriented x3: Yes   Mood/Affect: Normal         Dilation     Both eyes: 1.0% Mydriacyl, 2.5% Phenylephrine @ 3:03 PM           Slit Lamp and Fundus Exam     Slit Lamp  Exam       Right Left   Lids/Lashes Dermatochalasis - upper lid, mild MGD Dermatochalasis - upper lid, mild MGD   Conjunctiva/Sclera White and quiet White and quiet   Cornea mild arcus, well healed temporal cataract wounds, trace tear film debris mild arcus, well healed temporal cataract wounds, mild Debris in tear film   Anterior Chamber Deep and quiet Deep and quiet, no cell or flare   Iris Round and dilated Round and dilated   Lens Toric PC IOL in good position with marks at 0130 and 0730 with open PC Toric PC IOL in good position with marks at 1200 and 0600   Anterior Vitreous mild syneresis mild syneresis, Posterior vitreous detachment         Fundus Exam       Right Left   Posterior Vitreous mild syneresis mild syneresis, Posterior vitreous detachment   Disc Mild temporal Pallor, Sharp rim, thin inferior rim, temporal PPA, +cupping Mild temporal Pallor, Sharp rim, thin inferior rim, temporal PPA/PPP, +cupping   C/D Ratio 0.65 0.65   Macula Flat, Blunted foveal reflex,  Epiretinal membrane with severe topographic distortion Flat, Blunted foveal reflex, mild RPE mottling and clumping, No heme or edema   Vessels attenuated, Tortuous attenuated, Tortuous, mild Copper wiring, mild AV crossing changes   Periphery Attached, mild reticular degeneration, mild peripheral drusen, No RT/RD Attached, mild reticular degeneration, mild peripheral drusen            IMAGING AND PROCEDURES  Imaging and Procedures for 07/09/21  OCT, Retina - OU - Both Eyes       Right Eye Quality Higgins good. Central Foveal Thickness: 492. Progression has worsened. Findings include abnormal foveal contour, no IRF, no SRF, epiretinal membrane, preretinal fibrosis, macular pucker (Mild increase  in central pucker and PRF).   Left Eye Quality Higgins good. Central Foveal Thickness: 262. Progression has been stable. Findings include no SRF, normal foveal contour, no IRF.   Notes *Images captured and stored on drive  Severe epiretinal membrane OD with topographic distortion OD, accounts for acuity  Abbreviations: NFP - Normal foveal profile. CME - cystoid macular edema. PED - pigment epithelial detachment. IRF - intraretinal fluid. SRF - subretinal fluid. EZ - ellipsoid zone. ERM - epiretinal membrane. ORA - outer retinal atrophy. ORT - outer retinal tubulation. SRHM - subretinal hyper-reflective material. IRHM - intraretinal hyper-reflective material     Color Fundus Photography Optos - OU - Both Eyes       Right Eye Progression has no prior data. Disc findings include increased cup to disc ratio. Macula : epiretinal membrane. Vessels : normal observations. Periphery : normal observations.   Left Eye Progression has no prior data. Disc findings include increased cup to disc ratio. Macula : normal observations. Vessels : normal observations. Periphery : normal observations.   Notes OD, with epiretinal membrane             ASSESSMENT/PLAN:  Macular pucker, right eye Severe epiretinal membrane, macular thickening accounts for acuity OD  I will review with the patient and husband that the only treatment for this condition is observation with no chance visual acuity improvement versus surgical intervention whereby procedure lasting 12 to 18 minutes can successfully remove this area and and over the weeks to months thereafter visual acuity can only stabilize but more importantly have a 95% chance of improvement  Glaucoma, low tension Under the care of Dr. Marshall Cork, follow-up glaucoma with Dr. Kathlen Mody and [facility]  ICD-10-CM   1. Epiretinal membrane (ERM) of right eye  H35.371 OCT, Retina - OU - Both Eyes    Color Fundus Photography Optos - OU - Both Eyes     2. Macular pucker, right eye  H35.371     3. Low tension glaucoma of right eye, moderate stage  H40.1212       1.  OD, with severe epiretinal membrane and visual symptoms to match.  2.  Lengthy discussion with the patient and husband offering the potential for visual acuity stabilization but mostly visual acuity improvement with vitrectomy membrane peel.  Risk benefits reviewed.  3.  Patient to consider the options.  SHe can contact the office at any time to schedule.  Paperwork will be awaiting should she choose to proceed with surgery in the right eye  Ophthalmic Meds Ordered this visit:  No orders of the defined types were placed in this encounter.      Return ,, [facility], for Schedule vitrectomy membrane [phone], OD H35.371.  There are no Patient Instructions on file for this visit.   Explained the diagnoses, plan, and follow up with the patient and they expressed understanding.  Patient expressed understanding of the importance of proper follow up care.   Clent Demark Lavonta Tillis M.D. Diseases & Surgery of the Retina and Vitreous [facility] [place] 07/09/21     Abbreviations: M myopia (nearsighted); A astigmatism; H hyperopia (farsighted); P presbyopia; Mrx spectacle prescription;  CTL contact lenses; OD right eye; OS left eye; OU both eyes  XT exotropia; ET esotropia; PEK punctate epithelial keratitis; PEE punctate epithelial erosions; DES dry eye syndrome; MGD meibomian gland dysfunction; ATs artificial tears; PFAT's preservative free artificial tears; [place] nuclear sclerotic cataract; PSC posterior subcapsular cataract; ERM epi-retinal membrane; PVD posterior vitreous detachment; RD retinal detachment; DM diabetes mellitus; DR diabetic retinopathy; NPDR non-proliferative diabetic retinopathy; PDR proliferative diabetic retinopathy; CSME clinically significant macular edema; DME diabetic macular edema; dbh dot blot hemorrhages; CWS cotton wool spot;  POAG primary open angle glaucoma; C/D cup-to-disc ratio; HVF humphrey visual field; GVF goldmann visual field; OCT optical coherence tomography; IOP intraocular pressure; BRVO Branch retinal vein occlusion; CRVO central retinal vein occlusion; CRAO central retinal artery occlusion; BRAO branch retinal artery occlusion; RT retinal tear; SB scleral buckle; PPV pars plana vitrectomy; VH Vitreous hemorrhage; PRP panretinal laser photocoagulation; IVK intravitreal kenalog; VMT vitreomacular traction; MH Macular hole;  NVD neovascularization of the disc; NVE neovascularization elsewhere; AREDS age related eye disease study; ARMD age related macular degeneration; POAG primary open angle glaucoma; EBMD epithelial/anterior basement membrane dystrophy; ACIOL anterior chamber intraocular lens; IOL intraocular lens; PCIOL posterior chamber intraocular lens; Phaco/IOL phacoemulsification with intraocular lens placement; [place] photorefractive keratectomy; LASIK laser assisted in situ keratomileusis; HTN hypertension; DM diabetes mellitus; COPD chronic obstructive pulmonary disease

## 2021-07-15 ENCOUNTER — Other Ambulatory Visit: Payer: Self-pay

## 2021-07-15 ENCOUNTER — Ambulatory Visit
Admission: RE | Admit: 2021-07-15 | Discharge: 2021-07-15 | Disposition: A | Discharge status: Home / Self Care | Payer: Medicare HMO | Source: Ambulatory Visit | Attending: Obstetrics and Gynecology | Admitting: Obstetrics and Gynecology

## 2021-07-15 DIAGNOSIS — Z1231 Encounter for screening mammogram for malignant neoplasm of breast: Secondary | ICD-10-CM | POA: Diagnosis not present

## 2021-07-15 NOTE — Progress Notes (Signed)
Patient ID: Cindy Higgins, female   DOB: 03-24-1946, 75 y.o.   MRN: [id number]  Medical screening examination/treatment/procedure(s) were performed by non-physician practitioner and as supervising physician I was immediately available for consultation/collaboration.  I agree with above. Cathlean Cower, MD

## 2021-07-22 ENCOUNTER — Other Ambulatory Visit: Payer: Self-pay

## 2021-07-22 ENCOUNTER — Encounter (INDEPENDENT_AMBULATORY_CARE_PROVIDER_SITE_OTHER): Payer: Self-pay

## 2021-07-22 ENCOUNTER — Ambulatory Visit (INDEPENDENT_AMBULATORY_CARE_PROVIDER_SITE_OTHER): Payer: Medicare HMO

## 2021-07-22 DIAGNOSIS — H35371 Puckering of macula, right eye: Secondary | ICD-10-CM

## 2021-07-22 MED ORDER — OFLOXACIN 0.3 % OP SOLN: 1.0000 [drp] | Freq: Four times a day (QID) | OPHTHALMIC | 0 refills | Status: AC

## 2021-07-22 MED ORDER — PREDNISOLONE ACETATE 1 % OP SUSP: 1.0000 [drp] | Freq: Four times a day (QID) | OPHTHALMIC | 0 refills | Status: AC

## 2021-07-22 NOTE — Progress Notes (Signed)
07/22/2021     CHIEF COMPLAINT Patient presents for Pre-op Exam   HISTORY OF PRESENT ILLNESS: Cindy Higgins is a 75 y.o. female who presents to the clinic today for:   HPI   Pre op od sx 08/05/2021. Patient states vision is stable and unchanged since last visit. Denies any new floaters or FOL. Pt uses [place] bid ou.  Pt states she has an allergy to iodine, betadine.  Last edited by Laurin Coder on 07/22/2021  2:34 PM.        HISTORICAL INFORMATION:   Selected notes from the MEDICAL RECORD NUMBER    Lab Results  Component Value Date   HGBA1C 6.0 04/20/2021     CURRENT MEDICATIONS: Current Outpatient Medications (Ophthalmic Drugs)  Medication Sig   brimonidine-timolol (COMBIGAN) 0.2-0.5 % ophthalmic solution Place 1 drop into both eyes every 12 (twelve) hours.   cycloSPORINE (RESTASIS) 0.05 % ophthalmic emulsion Place 1 drop into both eyes 2 (two) times daily. (Patient not taking: Reported on 04/20/2021)   fluorometholone (FML) 0.1 % ophthalmic suspension 1 drop every 4 (four) hours. (Patient not taking: No sig reported)   latanoprost (XALATAN) 0.005 % ophthalmic solution 1 drop at bedtime. (Patient not taking: No sig reported)   Loteprednol Etabonate (EYSUVIS) 0.25 % SUSP Apply 1 drop to eye 2 (two) times daily. OU (Patient not taking: Reported on 04/20/2021)   Travoprost, BAK Free, (TRAVATAN) 0.004 % SOLN ophthalmic solution Place 1 drop into both eyes at bedtime. (Patient not taking: Reported on 07/06/2021)   No current facility-administered medications for this visit. (Ophthalmic Drugs)   Current Outpatient Medications (Other)  Medication Sig   ALPRAZolam (XANAX) 0.5 MG tablet TAKE 1 TABLET BY MOUTH 2 TIMES DAILY AS NEEDED.   buPROPion (WELLBUTRIN XL) 150 MG 24 hr tablet Take 1 tablet (150 mg total) by mouth daily. (Patient not taking: Reported on 07/06/2021)   calcium-vitamin D (OSCAL WITH D) 250-125 MG-UNIT tablet Take 1 tablet by mouth daily. (Patient not taking:  Reported on 04/20/2021)   citalopram (CELEXA) 40 MG tablet TAKE 1 TABLET BY MOUTH EVERY DAY   estradiol (ESTRACE) 0.5 MG tablet Take 0.5 mg by mouth daily.   levothyroxine (SYNTHROID) 25 MCG tablet TAKE 1 TABLET BY MOUTH ONCE DAILY BEFORE BREAKFAST   rosuvastatin (CRESTOR) 20 MG tablet Take 1 tablet (20 mg total) by mouth daily. (Patient not taking: Reported on 07/06/2021)   vitamin B-12 (CYANOCOBALAMIN) 1000 MCG tablet Take 1 tablet (1,000 mcg total) by mouth daily.   No current facility-administered medications for this visit. (Other)     ALLERGIES Allergies  Allergen Reactions   Sulfa Antibiotics Anaphylaxis   Iodine Other (See Comments)    Burning   Other    Scallops [Shellfish Allergy] Nausea And Vomiting   Codeine Itching and Rash   Penicillins Itching    Has patient had a PCN reaction causing immediate rash, facial/tongue/throat swelling, SOB or lightheadedness with hypotension:  Unknown think it was rash Has patient had a PCN reaction causing severe rash involving mucus membranes or skin necrosis: unknown childhood Has patient had a PCN reaction that required hospitalization: unknown Has patient had a PCN reaction occurring within the last 10 years: no childhood If all of the above answers are "NO", then may proceed with Cephalosporin use.      PAST MEDICAL HISTORY Past Medical History:  Diagnosis Date   ALLERGIC RHINITIS 12/22/2007   Anxiety    ANXIETY DEPRESSION 08/30/2007   Arthritis  Left thumb bone on bone   BLURRED VISION, INTERMITTENT 12/22/2007   COLONIC POLYPS, HX OF 12/22/2007   01/18/2006   Glaucoma    NTG Glaucoma   GLUCOSE INTOLERANCE 07/08/2009   Headache(784.0) 12/22/2007   Herpes zoster of eye 05/10/2011   HYPERCHOLESTEROLEMIA 08/30/2007   HYPERLIPIDEMIA 03/26/2008   Hypothyroidism 07/24/2020   Impaired glucose tolerance 05/09/2011   MITRAL VALVE PROLAPSE, HX OF 08/30/2007   OSTEOPOROSIS 12/22/2007   POSTMENOPAUSAL STATUS 08/30/2007   SINUSITIS-  ACUTE-NOS 02/06/2010   Past Surgical History:  Procedure Laterality Date   ABDOMINAL HYSTERECTOMY     AUGMENTATION MAMMAPLASTY     COLONOSCOPY     COSMETIC SURGERY     DILATION AND CURETTAGE OF UTERUS  2007   OOPHORECTOMY     POLYPECTOMY      FAMILY HISTORY Family History  Problem Relation Age of Onset   Heart disease Mother    Diabetes Mother    Hyperlipidemia Mother    Alcohol abuse Father    Breast cancer Sister    Cancer Sister    Breast cancer Sister    Breast cancer Maternal Grandmother    Testicular cancer Son     SOCIAL HISTORY Social History   Tobacco Use   Smoking status: Never   Smokeless tobacco: Never  Substance Use Topics   Alcohol use: Yes    Alcohol/week: 2.0 standard drinks    Types: 2 drink(s) per week    Comment: daily   Drug use: No         OPHTHALMIC EXAM:  Base Eye Exam     Visual Acuity (ETDRS)       Right Left   Dist cc 20/30 -1 20/40 -1   Dist ph cc NI NI    Correction: Glasses         Tonometry (Tonopen)       Right Left   Pressure 15          Pupils       Pupils Dark Light Shape React APD   Right PERRL 4 3 Round Brisk None   Left PERRL 4 3 Round Brisk None         Extraocular Movement       Right Left    Full Full         Neuro/Psych     Oriented x3: Yes   Mood/Affect: Normal         Dilation     Both eyes: No dilation             IMAGING AND PROCEDURES  Imaging and Procedures for '@TODAY'$ @           ASSESSMENT/PLAN:  No diagnosis found.  Ophthalmic Meds Ordered this visit:  No orders of the defined types were placed in this encounter.       Pre-op completed. Operative consent obtained with pre-op eye drops reviewed with [name] and sent via [facility] as needed. Post op instructions reviewed with patient and per patient all questions answered.  Laurin Coder

## 2021-08-05 ENCOUNTER — Encounter (AMBULATORY_SURGERY_CENTER): Payer: Medicare HMO | Admitting: Ophthalmology

## 2021-08-05 DIAGNOSIS — H35371 Puckering of macula, right eye: Secondary | ICD-10-CM | POA: Diagnosis not present

## 2021-08-06 ENCOUNTER — Ambulatory Visit (INDEPENDENT_AMBULATORY_CARE_PROVIDER_SITE_OTHER): Payer: Medicare HMO | Admitting: Ophthalmology

## 2021-08-06 ENCOUNTER — Other Ambulatory Visit: Payer: Self-pay | Admitting: Internal Medicine

## 2021-08-06 ENCOUNTER — Other Ambulatory Visit: Payer: Self-pay

## 2021-08-06 ENCOUNTER — Encounter (INDEPENDENT_AMBULATORY_CARE_PROVIDER_SITE_OTHER): Payer: Self-pay | Admitting: Ophthalmology

## 2021-08-06 DIAGNOSIS — H35371 Puckering of macula, right eye: Secondary | ICD-10-CM

## 2021-08-06 NOTE — Progress Notes (Signed)
08/06/2021     CHIEF COMPLAINT Patient presents for  Chief Complaint  Patient presents with   Post-op Follow-up      HISTORY OF PRESENT ILLNESS: Cindy Higgins is a 75 y.o. female who presents to the clinic today for:   HPI     Post-op Follow-up   In right eye.  Discomfort includes pain and tearing.  Negative for itching, foreign body sensation, discharge and floaters.        Comments   1D PO OD sx 08/05/2021. Pt states she took tylenol twice last night and one this morning around 7 AM. Pt states she woke up today and had a headache. Described the pain as a 5/10, states "it wasn't excruciating but I could tell it was there." Pt used the prednisolone and ofloxacin as directed before surgery, and is going to start again today four times a day in the right eye. Pt used Combigan OS this morning one time.       Last edited by Laurin Coder on 08/06/2021  8:57 AM.      Referring physician: Biagio Borg, MD [place],  [place] [id number]  HISTORICAL INFORMATION:   Selected notes from the MEDICAL RECORD NUMBER    Lab Results  Component Value Date   HGBA1C 6.0 04/20/2021     CURRENT MEDICATIONS: Current Outpatient Medications (Ophthalmic Drugs)  Medication Sig   brimonidine-timolol (COMBIGAN) 0.2-0.5 % ophthalmic solution Place 1 drop into both eyes every 12 (twelve) hours.   cycloSPORINE (RESTASIS) 0.05 % ophthalmic emulsion Place 1 drop into both eyes 2 (two) times daily. (Patient not taking: Reported on 04/20/2021)   fluorometholone (FML) 0.1 % ophthalmic suspension 1 drop every 4 (four) hours. (Patient not taking: No sig reported)   latanoprost (XALATAN) 0.005 % ophthalmic solution 1 drop at bedtime. (Patient not taking: No sig reported)   Loteprednol Etabonate (EYSUVIS) 0.25 % SUSP Apply 1 drop to eye 2 (two) times daily. OU (Patient not taking: Reported on 04/20/2021)   Travoprost, BAK Free, (TRAVATAN) 0.004 % SOLN ophthalmic solution Place 1 drop  into both eyes at bedtime. (Patient not taking: Reported on 07/06/2021)   No current facility-administered medications for this visit. (Ophthalmic Drugs)   Current Outpatient Medications (Other)  Medication Sig   ALPRAZolam (XANAX) 0.5 MG tablet TAKE 1 TABLET BY MOUTH 2 TIMES DAILY AS NEEDED.   buPROPion (WELLBUTRIN XL) 150 MG 24 hr tablet Take 1 tablet (150 mg total) by mouth daily. (Patient not taking: Reported on 07/06/2021)   calcium-vitamin D (OSCAL WITH D) 250-125 MG-UNIT tablet Take 1 tablet by mouth daily. (Patient not taking: Reported on 04/20/2021)   citalopram (CELEXA) 40 MG tablet TAKE 1 TABLET BY MOUTH EVERY DAY   estradiol (ESTRACE) 0.5 MG tablet Take 0.5 mg by mouth daily.   levothyroxine (SYNTHROID) 25 MCG tablet TAKE 1 TABLET BY MOUTH ONCE DAILY BEFORE BREAKFAST   rosuvastatin (CRESTOR) 20 MG tablet Take 1 tablet (20 mg total) by mouth daily. (Patient not taking: Reported on 07/06/2021)   vitamin B-12 (CYANOCOBALAMIN) 1000 MCG tablet Take 1 tablet (1,000 mcg total) by mouth daily.   No current facility-administered medications for this visit. (Other)      REVIEW OF SYSTEMS:    ALLERGIES Allergies  Allergen Reactions   Sulfa Antibiotics Anaphylaxis   Iodine Other (See Comments)    Burning   Other    Scallops [Shellfish Allergy] Nausea And Vomiting   Codeine Itching and Rash  Penicillins Itching    Has patient had a PCN reaction causing immediate rash, facial/tongue/throat swelling, SOB or lightheadedness with hypotension:  Unknown think it was rash Has patient had a PCN reaction causing severe rash involving mucus membranes or skin necrosis: unknown childhood Has patient had a PCN reaction that required hospitalization: unknown Has patient had a PCN reaction occurring within the last 10 years: no childhood If all of the above answers are "NO", then may proceed with Cephalosporin use.      PAST MEDICAL HISTORY Past Medical History:  Diagnosis Date   ALLERGIC  RHINITIS 12/22/2007   Anxiety    ANXIETY DEPRESSION 08/30/2007   Arthritis    Left thumb bone on bone   BLURRED VISION, INTERMITTENT 12/22/2007   COLONIC POLYPS, HX OF 12/22/2007   01/18/2006   Glaucoma    NTG Glaucoma   GLUCOSE INTOLERANCE 07/08/2009   Headache(784.0) 12/22/2007   Herpes zoster of eye 05/10/2011   HYPERCHOLESTEROLEMIA 08/30/2007   HYPERLIPIDEMIA 03/26/2008   Hypothyroidism 07/24/2020   Impaired glucose tolerance 05/09/2011   MITRAL VALVE PROLAPSE, HX OF 08/30/2007   OSTEOPOROSIS 12/22/2007   POSTMENOPAUSAL STATUS 08/30/2007   SINUSITIS- ACUTE-NOS 02/06/2010   Past Surgical History:  Procedure Laterality Date   ABDOMINAL HYSTERECTOMY     AUGMENTATION MAMMAPLASTY     COLONOSCOPY     COSMETIC SURGERY     DILATION AND CURETTAGE OF UTERUS  2007   OOPHORECTOMY     POLYPECTOMY      FAMILY HISTORY Family History  Problem Relation Age of Onset   Heart disease Mother    Diabetes Mother    Hyperlipidemia Mother    Alcohol abuse Father    Breast cancer Sister    Cancer Sister    Breast cancer Sister    Breast cancer Maternal Grandmother    Testicular cancer Son     SOCIAL HISTORY Social History   Tobacco Use   Smoking status: Never   Smokeless tobacco: Never  Substance Use Topics   Alcohol use: Yes    Alcohol/week: 2.0 standard drinks    Types: 2 drink(s) per week    Comment: daily   Drug use: No         OPHTHALMIC EXAM:  Base Eye Exam     Visual Acuity (ETDRS)       Right Left   Dist [place] 20/60 -2 20/40 -2   Dist ph [place] 20/30 -2          Tonometry (Tonopen, 9:04 AM)       Right Left   Pressure 11 13         Pupils       Dark Light React APD   Right [employer]. Dilated  Fixed None   Left 4 3           Extraocular Movement       Right Left    Full Full         Neuro/Psych     Oriented x3: Yes   Mood/Affect: Normal         Dilation     Right eye: 1.0% Mydriacyl, 2.5% Phenylephrine @ 9:04 AM           Slit Lamp and  Fundus Exam     Slit Lamp Exam       Right Left   Lids/Lashes Dermatochalasis - upper lid, mild MGD Dermatochalasis - upper lid, mild MGD   Conjunctiva/Sclera White and quiet White and quiet   Cornea  mild arcus, well healed temporal cataract wounds, trace tear film debris mild arcus, well healed temporal cataract wounds, mild Debris in tear film   Anterior Chamber Deep and quiet Deep and quiet, no cell or flare   Iris Round and dilated Round and dilated   Lens Toric PC IOL in good position with marks at 0130 and 0730 with open PC Toric PC IOL in good position with marks at 1200 and 0600   Anterior Vitreous  mild syneresis, Posterior vitreous detachment         Fundus Exam       Right Left   Posterior Vitreous clear avitric    Disc Mild temporal Pallor, Sharp rim, thin inferior rim, temporal PPA, +cupping    C/D Ratio 0.65    Macula Flat, Blunted foveal reflex, post ilm removal changes, less topo distortion    Vessels attenuated, Tortuous    Periphery Attached, mild reticular degeneration, mild peripheral drusen, No RT/RD             IMAGING AND PROCEDURES  Imaging and Procedures for 08/25/21           ASSESSMENT/PLAN:  Macular pucker, right eye Ofloxacin  4 times daily to the operative eye  Prednisolone acetate 1 drop to the operative eye 4 times daily  Patient instructed not to refill the medications and use them for maximum of 3 weeks.  Patient instructed do not rub the eye.  Patient has the option to use the patch at night. No lifting and bending for 1 week. No water IN the eye for 10 days. Do not rub the eye. Wear shield at night for 1-3 days.  Continue your topical medications for a total of 3 weeks.  Do not refill your postoperative medications unless instructed.  Refrain from exercise or intentional activity which increases our heart rate above resting levels.  Normal walking to complete normal activities of your day are appropriate.  Driving:  Legally,  you only need one good eye, of 20/40 or better to drive.  However, the practice does not recommend driving during first weeks after surgery, IF you are uncomfortable with your visual functioning or capabilities.   If you have known sleep apnea, wear your CPAP as you normally should.     ICD-10-CM   1. Macular pucker, right eye  H35.371       1.  OD looks great postop day #1 with improved acuity.  Patient reviewed medications and limitations today  2.  Patient allowed to resume full activity in 10 days  3.  Ophthalmic Meds Ordered this visit:  No orders of the defined types were placed in this encounter.      Return in about 2 weeks (around 08/20/2021) for POST OP, dilate, OD, OCT.  Patient Instructions  Ofloxacin  4 times daily to the operative eye  Prednisolone acetate 1 drop to the operative eye 4 times daily  Patient instructed not to refill the medications and use them for maximum of 3 weeks.  Patient instructed do not rub the eye.  Patient has the option to use the patch at night.  No lifting and bending for 1 week. No water IN the eye for 10 days. Do not rub the eye. Wear shield at night for 1-3 days.  Continue your topical medications for a total of 3 weeks.  Do not refill your postoperative medications unless instructed.  Refrain from exercise or intentional activity which increases our heart rate above resting levels.  Normal  walking to complete normal activities of your day are appropriate.  Driving:  Legally, you only need one good eye, of 20/40 or better to drive.  However, the practice does not recommend driving during first weeks after surgery, IF you are uncomfortable with your visual functioning or capabilities.   If you have known sleep apnea, wear your CPAP as you normally should.      Explained the diagnoses, plan, and follow up with the patient and they expressed understanding.  Patient expressed understanding of the importance of proper follow up care.    Clent Demark Pietra Zuluaga M.D. Diseases & Surgery of the Retina and Vitreous [facility]ina [facility] Diabetic [place] 08/25/21     Abbreviations: M myopia (nearsighted); A astigmatism; H hyperopia (farsighted); P presbyopia; Mrx spectacle prescription;  CTL contact lenses; OD right eye; OS left eye; OU both eyes  XT exotropia; ET esotropia; PEK punctate epithelial keratitis; PEE punctate epithelial erosions; DES dry eye syndrome; MGD meibomian gland dysfunction; ATs artificial tears; PFAT's preservative free artificial tears; [place] nuclear sclerotic cataract; PSC posterior subcapsular cataract; ERM epi-retinal membrane; PVD posterior vitreous detachment; RD retinal detachment; DM diabetes mellitus; DR diabetic retinopathy; NPDR non-proliferative diabetic retinopathy; PDR proliferative diabetic retinopathy; CSME clinically significant macular edema; DME diabetic macular edema; dbh dot blot hemorrhages; CWS cotton wool spot; POAG primary open angle glaucoma; C/D cup-to-disc ratio; HVF humphrey visual field; GVF goldmann visual field; OCT optical coherence tomography; IOP intraocular pressure; BRVO Branch retinal vein occlusion; CRVO central retinal vein occlusion; CRAO central retinal artery occlusion; BRAO branch retinal artery occlusion; RT retinal tear; SB scleral buckle; PPV pars plana vitrectomy; VH Vitreous hemorrhage; PRP panretinal laser photocoagulation; IVK intravitreal kenalog; VMT vitreomacular traction; MH Macular hole;  NVD neovascularization of the disc; NVE neovascularization elsewhere; AREDS age related eye disease study; ARMD age related macular degeneration; POAG primary open angle glaucoma; EBMD epithelial/anterior basement membrane dystrophy; ACIOL anterior chamber intraocular lens; IOL intraocular lens; PCIOL posterior chamber intraocular lens; Phaco/IOL phacoemulsification with intraocular lens placement; [place] photorefractive keratectomy; LASIK laser assisted in situ keratomileusis; HTN hypertension; DM  diabetes mellitus; COPD chronic obstructive pulmonary disease

## 2021-08-06 NOTE — Assessment & Plan Note (Signed)

## 2021-08-06 NOTE — Patient Instructions (Signed)

## 2021-08-17 ENCOUNTER — Encounter (INDEPENDENT_AMBULATORY_CARE_PROVIDER_SITE_OTHER): Payer: Self-pay | Admitting: Ophthalmology

## 2021-08-17 ENCOUNTER — Ambulatory Visit (INDEPENDENT_AMBULATORY_CARE_PROVIDER_SITE_OTHER): Payer: Medicare HMO | Admitting: Ophthalmology

## 2021-08-17 ENCOUNTER — Other Ambulatory Visit: Payer: Self-pay

## 2021-08-17 DIAGNOSIS — H35371 Puckering of macula, right eye: Secondary | ICD-10-CM | POA: Diagnosis not present

## 2021-08-17 NOTE — Assessment & Plan Note (Signed)
Now some 12 days post vitrectomy membrane peel, looks great,  Patient to continue topical medication use for total of 21 days thus for 9 more days.  Patient to use topical Pred forte 1 drop to the right eye once daily for 2 days thereafter then cease all topical medications

## 2021-08-17 NOTE — Progress Notes (Signed)
08/17/2021     CHIEF COMPLAINT Patient presents for  Chief Complaint  Patient presents with   Post-op Follow-up      HISTORY OF PRESENT ILLNESS: Cindy Higgins is a 75 y.o. female who presents to the clinic today for:   HPI     Post-op Follow-up   In right eye.  Discomfort includes Negative for pain, itching, foreign body sensation, tearing, discharge and floaters.  Vision is improved.        Comments   2 week PO OD oct sx 08/05/2021. Pt states "I can't wear my glasses because my vision has changed that much, which is good." Pt denies new FOL and floaters. Pt uses Combigan bid ou, pt is continuing prednisolone and ofloxacin OD 3-4 times a day.      Last edited by Laurin Coder on 08/17/2021  8:50 AM.      Referring physician: Biagio Borg, MD [place],  [place] [id number]  HISTORICAL INFORMATION:   Selected notes from the MEDICAL RECORD NUMBER    Lab Results  Component Value Date   HGBA1C 6.0 04/20/2021     CURRENT MEDICATIONS: Current Outpatient Medications (Ophthalmic Drugs)  Medication Sig   brimonidine-timolol (COMBIGAN) 0.2-0.5 % ophthalmic solution Place 1 drop into both eyes every 12 (twelve) hours.   cycloSPORINE (RESTASIS) 0.05 % ophthalmic emulsion Place 1 drop into both eyes 2 (two) times daily. (Patient not taking: Reported on 04/20/2021)   fluorometholone (FML) 0.1 % ophthalmic suspension 1 drop every 4 (four) hours. (Patient not taking: No sig reported)   latanoprost (XALATAN) 0.005 % ophthalmic solution 1 drop at bedtime. (Patient not taking: No sig reported)   Loteprednol Etabonate (EYSUVIS) 0.25 % SUSP Apply 1 drop to eye 2 (two) times daily. OU (Patient not taking: Reported on 04/20/2021)   Travoprost, BAK Free, (TRAVATAN) 0.004 % SOLN ophthalmic solution Place 1 drop into both eyes at bedtime. (Patient not taking: Reported on 07/06/2021)   No current facility-administered medications for this visit. (Ophthalmic Drugs)    Current Outpatient Medications (Other)  Medication Sig   ALPRAZolam (XANAX) 0.5 MG tablet TAKE 1 TABLET BY MOUTH 2 TIMES DAILY AS NEEDED.   buPROPion (WELLBUTRIN XL) 150 MG 24 hr tablet Take 1 tablet (150 mg total) by mouth daily. (Patient not taking: Reported on 07/06/2021)   calcium-vitamin D (OSCAL WITH D) 250-125 MG-UNIT tablet Take 1 tablet by mouth daily. (Patient not taking: Reported on 04/20/2021)   citalopram (CELEXA) 40 MG tablet TAKE 1 TABLET BY MOUTH EVERY DAY   estradiol (ESTRACE) 0.5 MG tablet Take 0.5 mg by mouth daily.   levothyroxine (SYNTHROID) 25 MCG tablet TAKE 1 TABLET BY MOUTH ONCE DAILY BEFORE BREAKFAST   rosuvastatin (CRESTOR) 20 MG tablet Take 1 tablet (20 mg total) by mouth daily. (Patient not taking: Reported on 07/06/2021)   vitamin B-12 (CYANOCOBALAMIN) 1000 MCG tablet Take 1 tablet (1,000 mcg total) by mouth daily.   No current facility-administered medications for this visit. (Other)      REVIEW OF SYSTEMS:    ALLERGIES Allergies  Allergen Reactions   Sulfa Antibiotics Anaphylaxis   Iodine Other (See Comments)    Burning   Other    Scallops [Shellfish Allergy] Nausea And Vomiting   Codeine Itching and Rash   Penicillins Itching    Has patient had a PCN reaction causing immediate rash, facial/tongue/throat swelling, SOB or lightheadedness with hypotension:  Unknown think it was rash Has patient had a PCN reaction  causing severe rash involving mucus membranes or skin necrosis: unknown childhood Has patient had a PCN reaction that required hospitalization: unknown Has patient had a PCN reaction occurring within the last 10 years: no childhood If all of the above answers are "NO", then may proceed with Cephalosporin use.      PAST MEDICAL HISTORY Past Medical History:  Diagnosis Date   ALLERGIC RHINITIS 12/22/2007   Anxiety    ANXIETY DEPRESSION 08/30/2007   Arthritis    Left thumb bone on bone   BLURRED VISION, INTERMITTENT 12/22/2007    COLONIC POLYPS, HX OF 12/22/2007   01/18/2006   Glaucoma    NTG Glaucoma   GLUCOSE INTOLERANCE 07/08/2009   Headache(784.0) 12/22/2007   Herpes zoster of eye 05/10/2011   HYPERCHOLESTEROLEMIA 08/30/2007   HYPERLIPIDEMIA 03/26/2008   Hypothyroidism 07/24/2020   Impaired glucose tolerance 05/09/2011   MITRAL VALVE PROLAPSE, HX OF 08/30/2007   OSTEOPOROSIS 12/22/2007   POSTMENOPAUSAL STATUS 08/30/2007   SINUSITIS- ACUTE-NOS 02/06/2010   Past Surgical History:  Procedure Laterality Date   ABDOMINAL HYSTERECTOMY     AUGMENTATION MAMMAPLASTY     COLONOSCOPY     COSMETIC SURGERY     DILATION AND CURETTAGE OF UTERUS  2007   OOPHORECTOMY     POLYPECTOMY      FAMILY HISTORY Family History  Problem Relation Age of Onset   Heart disease Mother    Diabetes Mother    Hyperlipidemia Mother    Alcohol abuse Father    Breast cancer Sister    Cancer Sister    Breast cancer Sister    Breast cancer Maternal Grandmother    Testicular cancer Son     SOCIAL HISTORY Social History   Tobacco Use   Smoking status: Never   Smokeless tobacco: Never  Substance Use Topics   Alcohol use: Yes    Alcohol/week: 2.0 standard drinks    Types: 2 drink(s) per week    Comment: daily   Drug use: No         OPHTHALMIC EXAM:  Base Eye Exam     Visual Acuity (ETDRS)       Right Left   Dist [place] 20/30 +2 20/40 -1+1   Dist ph [place] NI NI  Pt states OS is reading lens        Tonometry (Tonopen, 8:54 AM)       Right Left   Pressure 11 14         Pupils       Pupils Dark Light APD   Right PERRL 4 3 None   Left PERRL 4 3 None         Extraocular Movement       Right Left    Full Full         Neuro/Psych     Oriented x3: Yes   Mood/Affect: Normal         Dilation     Right eye: 1.0% Mydriacyl, 2.5% Phenylephrine @ 8:54 AM           Slit Lamp and Fundus Exam     External Exam       Right Left   External Ecchymosis, smaller          Slit Lamp Exam       Right  Left   Lids/Lashes Dermatochalasis - upper lid, mild MGD Dermatochalasis - upper lid, mild MGD   Conjunctiva/Sclera White and quiet White and quiet   Cornea mild arcus, well healed temporal  cataract wounds, trace tear film debris mild arcus, well healed temporal cataract wounds, mild Debris in tear film   Anterior Chamber Deep and quiet Deep and quiet, no cell or flare   Iris Round and dilated Round and dilated   Lens Toric PC IOL in good position with marks at 0130 and 0730 with open PC Toric PC IOL in good position with marks at 1200 and 0600   Anterior Vitreous  mild syneresis, Posterior vitreous detachment         Fundus Exam       Right Left   Posterior Vitreous Clear, avitric    Disc Mild temporal Pallor, Sharp rim, thin inferior rim, temporal PPA, +cupping    C/D Ratio 0.65    Macula Flat, Blunted foveal reflex, no topographic distortion    Vessels attenuated, Tortuous    Periphery Attached, mild reticular degeneration, mild peripheral drusen, No RT/RD             IMAGING AND PROCEDURES  Imaging and Procedures for 08/17/21  OCT, Retina - OU - Both Eyes       Right Eye Quality was good. Central Foveal Thickness: 391. Progression has worsened. Findings include abnormal foveal contour, no IRF, no SRF (Mild increase in central pucker and PRF).   Left Eye Quality was good. Central Foveal Thickness: 260. Progression has been stable. Findings include no SRF, normal foveal contour, no IRF.   Notes Vastly improved macular condition now 12 days post vitrectomy membrane peel    Images captured and stored on drive  Severe epiretinal membrane OD with topographic distortion OD, accounts for acuity  Abbreviations: NFP - Normal foveal profile. CME - cystoid macular edema. PED - pigment epithelial detachment. IRF - intraretinal fluid. SRF - subretinal fluid. EZ - ellipsoid zone. ERM - epiretinal membrane. ORA - outer retinal atrophy. ORT - outer retinal tubulation. SRHM -  subretinal hyper-reflective material. IRHM - intraretinal hyper-reflective material             ASSESSMENT/PLAN:  Macular pucker, right eye Now some 12 days post vitrectomy membrane peel, looks great,  Patient to continue topical medication use for total of 21 days thus for 9 more days.  Patient to use topical Pred forte 1 drop to the right eye once daily for 2 days thereafter then cease all topical medications     ICD-10-CM   1. Epiretinal membrane (ERM) of right eye  H35.371 OCT, Retina - OU - Both Eyes    2. Macular pucker, right eye  H35.371       1.  OD, vastly improved post vitrectomy membrane peel for severe epiretinal membrane with topographic distortion.  Patient reports significant improvement in distance vision already occurring.  Of asked patient to complete topical medications over the next 9 days at 4 times daily thereafter use topical Pred forte 1 drop to the right eye once daily for 2 days only then cease all use do not refill medications in the meantime  2.  3.  Ophthalmic Meds Ordered this visit:  No orders of the defined types were placed in this encounter.      Return in about 4 months (around 12/18/2021) for DILATE OU, OCT.  There are no Patient Instructions on file for this visit.   Explained the diagnoses, plan, and follow up with the patient and they expressed understanding.  Patient expressed understanding of the importance of proper follow up care.   Clent Demark Nikodem Leadbetter M.D. Diseases & Surgery of the Retina  and Vitreous Retina & Diabetic [place] 08/17/21     Abbreviations: M myopia (nearsighted); A astigmatism; H hyperopia (farsighted); P presbyopia; Mrx spectacle prescription;  CTL contact lenses; OD right eye; OS left eye; OU both eyes  XT exotropia; ET esotropia; PEK punctate epithelial keratitis; PEE punctate epithelial erosions; DES dry eye syndrome; MGD meibomian gland dysfunction; ATs artificial tears; PFAT's preservative free  artificial tears; [place] nuclear sclerotic cataract; PSC posterior subcapsular cataract; ERM epi-retinal membrane; PVD posterior vitreous detachment; RD retinal detachment; DM diabetes mellitus; DR diabetic retinopathy; NPDR non-proliferative diabetic retinopathy; PDR proliferative diabetic retinopathy; CSME clinically significant macular edema; DME diabetic macular edema; dbh dot blot hemorrhages; CWS cotton wool spot; POAG primary open angle glaucoma; C/D cup-to-disc ratio; HVF humphrey visual field; GVF goldmann visual field; OCT optical coherence tomography; IOP intraocular pressure; BRVO Branch retinal vein occlusion; CRVO central retinal vein occlusion; CRAO central retinal artery occlusion; BRAO branch retinal artery occlusion; RT retinal tear; SB scleral buckle; PPV pars plana vitrectomy; VH Vitreous hemorrhage; PRP panretinal laser photocoagulation; IVK intravitreal kenalog; VMT vitreomacular traction; MH Macular hole;  NVD neovascularization of the disc; NVE neovascularization elsewhere; AREDS age related eye disease study; ARMD age related macular degeneration; POAG primary open angle glaucoma; EBMD epithelial/anterior basement membrane dystrophy; ACIOL anterior chamber intraocular lens; IOL intraocular lens; PCIOL posterior chamber intraocular lens; Phaco/IOL phacoemulsification with intraocular lens placement; [place] photorefractive keratectomy; LASIK laser assisted in situ keratomileusis; HTN hypertension; DM diabetes mellitus; COPD chronic obstructive pulmonary disease

## 2021-08-20 DIAGNOSIS — H401222 Low-tension glaucoma, left eye, moderate stage: Secondary | ICD-10-CM | POA: Diagnosis not present

## 2021-08-20 DIAGNOSIS — H401211 Low-tension glaucoma, right eye, mild stage: Secondary | ICD-10-CM | POA: Diagnosis not present

## 2021-08-20 DIAGNOSIS — H35371 Puckering of macula, right eye: Secondary | ICD-10-CM | POA: Diagnosis not present

## 2021-08-20 DIAGNOSIS — H04123 Dry eye syndrome of bilateral lacrimal glands: Secondary | ICD-10-CM | POA: Diagnosis not present

## 2021-10-06 ENCOUNTER — Other Ambulatory Visit: Payer: Self-pay | Admitting: Internal Medicine

## 2021-10-06 NOTE — Telephone Encounter (Signed)
Please refill as per office routine med refill policy (all routine meds to be refilled for 3 mo or monthly (per pt preference) up to one year from last visit, then month to month grace period for 3 mo, then further med refills will have to be denied) ? ?

## 2021-10-13 DIAGNOSIS — H401211 Low-tension glaucoma, right eye, mild stage: Secondary | ICD-10-CM | POA: Diagnosis not present

## 2021-10-13 DIAGNOSIS — H35371 Puckering of macula, right eye: Secondary | ICD-10-CM | POA: Diagnosis not present

## 2021-10-13 DIAGNOSIS — H524 Presbyopia: Secondary | ICD-10-CM | POA: Diagnosis not present

## 2021-10-13 DIAGNOSIS — H04123 Dry eye syndrome of bilateral lacrimal glands: Secondary | ICD-10-CM | POA: Diagnosis not present

## 2021-10-13 DIAGNOSIS — H401222 Low-tension glaucoma, left eye, moderate stage: Secondary | ICD-10-CM | POA: Diagnosis not present

## 2021-10-20 ENCOUNTER — Other Ambulatory Visit: Payer: Self-pay

## 2021-10-20 ENCOUNTER — Ambulatory Visit (INDEPENDENT_AMBULATORY_CARE_PROVIDER_SITE_OTHER): Payer: Medicare HMO | Admitting: Internal Medicine

## 2021-10-20 VITALS — BP 138/78 | HR 88 | Temp 97.8°F | Wt 141.4 lb

## 2021-10-20 DIAGNOSIS — E559 Vitamin D deficiency, unspecified: Secondary | ICD-10-CM | POA: Diagnosis not present

## 2021-10-20 DIAGNOSIS — Z23 Encounter for immunization: Secondary | ICD-10-CM

## 2021-10-20 DIAGNOSIS — R7302 Impaired glucose tolerance (oral): Secondary | ICD-10-CM

## 2021-10-20 DIAGNOSIS — E538 Deficiency of other specified B group vitamins: Secondary | ICD-10-CM

## 2021-10-20 DIAGNOSIS — E7800 Pure hypercholesterolemia, unspecified: Secondary | ICD-10-CM

## 2021-10-20 DIAGNOSIS — I10 Essential (primary) hypertension: Secondary | ICD-10-CM | POA: Diagnosis not present

## 2021-10-20 NOTE — Patient Instructions (Signed)
Please continue all other medications as before, and refills have been done if requested.  Please have the pharmacy call with any other refills you may need.  Please continue your efforts at being more active, low cholesterol diet, and weight control.  Please keep your appointments with your specialists as you may have planned  Ok to increase the Vitamin D to 4000 units if you already take the 2000 units  Please also take B12 1000 mcg per day for at least 6 months  Please make an Appointment to return in 6 months, or sooner if needed, also with Lab Appointment for testing done 3-5 days before at the [facility] (so this is for TWO appointments - please see the scheduling desk as you leave)  Due to the ongoing Covid 19 pandemic, our lab now requires an appointment for any labs done at our office.  If you need labs done and do not have an appointment, please call our office ahead of time to schedule before presenting to the lab for your testing.

## 2021-10-20 NOTE — Progress Notes (Signed)
Patient ID: Cindy Higgins, female   DOB: 19-Aug-1946, 75 y.o.   MRN: [id number]        Chief Complaint: follow up HTN, HLD and hyperglycemia, ow vit d and low b12       HPI:  Cindy Higgins is a 74 y.o. female here overall doing ok.  Pt denies chest pain, increased sob or doe, wheezing, orthopnea, PND, increased LE swelling, palpitations, dizziness or syncope.   Pt denies polydipsia, polyuria, or new focal neuro s/s.   Pt denies fever, wt loss, night sweats, loss of appetite, or other constitutional symptoms  Taking Vit d 2000 u.  Not taking B12.  Recent Cardiac Ct Score was Zero.  Had right eye surgury per Dr Zadie Rhine in [place] 2022, vision improvin, se is pleased.  Now back to weraing makeup, and depressive symptoms now much improved, looking forward, to doing things, never took the wellbutrin, but also not drinking ETOH as much, looking forward to xmas.   Wt Readings from Last 3 Encounters:  10/20/21 141 lb 6.4 oz (64.1 kg)  07/06/21 141 lb 9.6 oz (64.2 kg)  04/20/21 141 lb (64 kg)   BP Readings from Last 3 Encounters:  10/20/21 138/78  07/06/21 120/60  04/20/21 (!) 164/88         Past Medical History:  Diagnosis Date   ALLERGIC RHINITIS 12/22/2007   Anxiety    ANXIETY DEPRESSION 08/30/2007   Arthritis    Left thumb bone on bone   BLURRED VISION, INTERMITTENT 12/22/2007   COLONIC POLYPS, HX OF 12/22/2007   01/18/2006   Glaucoma    NTG Glaucoma   GLUCOSE INTOLERANCE 07/08/2009   Headache(784.0) 12/22/2007   Herpes zoster of eye 05/10/2011   HYPERCHOLESTEROLEMIA 08/30/2007   HYPERLIPIDEMIA 03/26/2008   Hypothyroidism 07/24/2020   Impaired glucose tolerance 05/09/2011   MITRAL VALVE PROLAPSE, HX OF 08/30/2007   OSTEOPOROSIS 12/22/2007   POSTMENOPAUSAL STATUS 08/30/2007   SINUSITIS- ACUTE-NOS 02/06/2010   Past Surgical History:  Procedure Laterality Date   ABDOMINAL HYSTERECTOMY     AUGMENTATION MAMMAPLASTY     COLONOSCOPY     COSMETIC SURGERY     DILATION AND CURETTAGE OF UTERUS  2007    OOPHORECTOMY     POLYPECTOMY      reports that she has never smoked. She has never used smokeless tobacco. She reports current alcohol use of about 2.0 standard drinks per week. She reports that she does not use drugs. family history includes Alcohol abuse in her father; Breast cancer in her maternal grandmother, sister, and sister; Cancer in her sister; Diabetes in her mother; Heart disease in her mother; Hyperlipidemia in her mother; Testicular cancer in her son. Allergies  Allergen Reactions   Sulfa Antibiotics Anaphylaxis   Iodine Other (See Comments)    Burning   Other    Scallops [Shellfish Allergy] Nausea And Vomiting   Codeine Itching and Rash   Penicillins Itching    Has patient had a PCN reaction causing immediate rash, facial/tongue/throat swelling, SOB or lightheadedness with hypotension:  Unknown think it was rash Has patient had a PCN reaction causing severe rash involving mucus membranes or skin necrosis: unknown childhood Has patient had a PCN reaction that required hospitalization: unknown Has patient had a PCN reaction occurring within the last 10 years: no childhood If all of the above answers are "NO", then may proceed with Cephalosporin use.     Shingrix [Zoster Vac Recomb Adjuvanted] Rash    Severe arm erythema and heat  Current Outpatient Medications on File Prior to Visit  Medication Sig Dispense Refill   ALPRAZolam (XANAX) 0.5 MG tablet TAKE 1 TABLET BY MOUTH 2 TIMES DAILY AS NEEDED. 60 tablet 2   brimonidine-timolol (COMBIGAN) 0.2-0.5 % ophthalmic solution Place 1 drop into both eyes every 12 (twelve) hours.     citalopram (CELEXA) 40 MG tablet TAKE 1 TABLET BY MOUTH EVERY DAY 90 tablet 2   estradiol (ESTRACE) 0.5 MG tablet Take 0.5 mg by mouth daily.     levothyroxine (SYNTHROID) 25 MCG tablet TAKE 1 TABLET BY MOUTH ONCE DAILY BEFORE BREAKFAST 90 tablet 1   Travoprost, BAK Free, (TRAVATAN) 0.004 % SOLN ophthalmic solution Place 1 drop into both eyes at  bedtime. 5 mL 11   vitamin B-12 (CYANOCOBALAMIN) 1000 MCG tablet Take 1 tablet (1,000 mcg total) by mouth daily. 90 tablet 3   calcium-vitamin D (OSCAL WITH D) 250-125 MG-UNIT tablet Take 1 tablet by mouth daily. (Patient not taking: Reported on 04/20/2021)     cycloSPORINE (RESTASIS) 0.05 % ophthalmic emulsion Place 1 drop into both eyes 2 (two) times daily. (Patient not taking: Reported on 04/20/2021) 0.4 mL 11   fluorometholone (FML) 0.1 % ophthalmic suspension 1 drop every 4 (four) hours. (Patient not taking: Reported on 07/24/2020)     latanoprost (XALATAN) 0.005 % ophthalmic solution 1 drop at bedtime. (Patient not taking: Reported on 07/24/2020)     Loteprednol Etabonate (EYSUVIS) 0.25 % SUSP Apply 1 drop to eye 2 (two) times daily. OU (Patient not taking: Reported on 04/20/2021)     No current facility-administered medications on file prior to visit.        ROS:  All others reviewed and negative.  Objective        PE:  BP 138/78 (BP Location: Right Arm, Patient Position: Sitting, Cuff Size: Normal)    Pulse 88    Temp 97.8 F (36.6 C) (Oral)    Wt 141 lb 6.4 oz (64.1 kg)    SpO2 95%    BMI 24.27 kg/m                 Constitutional: Pt appears in NAD               HENT: Head: NCAT.                Right Ear: External ear normal.                 Left Ear: External ear normal.                Eyes: . Pupils are equal, round, and reactive to light. Conjunctivae and EOM are normal               Nose: without d/c or deformity               Neck: Neck supple. Gross normal ROM               Cardiovascular: Normal rate and regular rhythm.                 Pulmonary/Chest: Effort normal and breath sounds without rales or wheezing.                Abd:  Soft, NT, ND, + BS, no organomegaly               Neurological: Pt is alert. At baseline orientation, motor grossly intact  Skin: Skin is warm. No rashes, no other new lesions, LE edema - none               Psychiatric: Pt behavior is  normal without agitation   Micro: none  Cardiac tracings I have personally interpreted today:  none  Pertinent Radiological findings (summarize): none   Lab Results  Component Value Date   WBC 6.7 04/20/2021   HGB 13.4 04/20/2021   HCT 40.1 04/20/2021   PLT 301.0 04/20/2021   GLUCOSE 103 (H) 04/20/2021   CHOL 275 (H) 04/20/2021   TRIG 257.0 (H) 04/20/2021   HDL 68.90 04/20/2021   LDLDIRECT 190.0 04/20/2021   LDLCALC 130 (H) 01/16/2019   ALT 20 04/20/2021   AST 19 04/20/2021   NA 135 04/20/2021   K 4.8 04/20/2021   CL 99 04/20/2021   CREATININE 0.66 04/20/2021   BUN 17 04/20/2021   CO2 28 04/20/2021   TSH 2.96 04/20/2021   HGBA1C 6.0 04/20/2021   Assessment/Plan:  Cindy Higgins is a 75 y.o. White or Caucasian [1] female with  has a past medical history of ALLERGIC RHINITIS (12/22/2007), Anxiety, ANXIETY DEPRESSION (08/30/2007), Arthritis, BLURRED VISION, INTERMITTENT (12/22/2007), COLONIC POLYPS, HX OF (12/22/2007), Glaucoma, GLUCOSE INTOLERANCE (07/08/2009), Headache(784.0) (12/22/2007), Herpes zoster of eye (05/10/2011), HYPERCHOLESTEROLEMIA (08/30/2007), HYPERLIPIDEMIA (03/26/2008), Hypothyroidism (07/24/2020), Impaired glucose tolerance (05/09/2011), MITRAL VALVE PROLAPSE, HX OF (08/30/2007), OSTEOPOROSIS (12/22/2007), POSTMENOPAUSAL STATUS (08/30/2007), and SINUSITIS- ACUTE-NOS (02/06/2010).  HYPERCHOLESTEROLEMIA Lab Results  Component Value Date   LDLCALC 130 (H) 01/16/2019   Uncontrolled but recent CT score was zero, pt to cont low chol diet only - no statin needed for now   Impaired glucose tolerance Lab Results  Component Value Date   HGBA1C 6.0 04/20/2021   Stable, pt to continue current medical treatment  - diet   Vitamin d deficiency Last vitamin D Lab Results  Component Value Date   VD25OH 32.09 04/20/2021   Low, to increase oral replacement to 4000 u qd  Hypertension, uncontrolled BP Readings from Last 3 Encounters:  10/20/21 138/78  07/06/21 120/60   04/20/21 (!) 164/88   Stable, pt to continue medical treatment  - diet, wt control, low salt, excercise   B12 deficiency Lab Results  Component Value Date   VITAMINB12 192 (L) 04/20/2021   Low, to start oral replacement - b12 1000 mcg qd  Followup: Return in about 6 months (around 04/20/2022).  Cathlean Cower, MD 10/25/2021 7:49 AM [facility]

## 2021-10-25 ENCOUNTER — Encounter: Payer: Self-pay | Admitting: Internal Medicine

## 2021-10-25 DIAGNOSIS — E538 Deficiency of other specified B group vitamins: Secondary | ICD-10-CM | POA: Insufficient documentation

## 2021-10-25 NOTE — Assessment & Plan Note (Signed)
BP Readings from Last 3 Encounters:  10/20/21 138/78  07/06/21 120/60  04/20/21 (!) 164/88   Stable, pt to continue medical treatment  - diet, wt control, low salt, excercise

## 2021-10-25 NOTE — Assessment & Plan Note (Signed)
Lab Results  Component Value Date   LDLCALC 130 (H) 01/16/2019   Uncontrolled but recent CT score was zero, pt to cont low chol diet only - no statin needed for now

## 2021-10-25 NOTE — Assessment & Plan Note (Signed)
Lab Results  Component Value Date   HGBA1C 6.0 04/20/2021   Stable, pt to continue current medical treatment  - diet

## 2021-10-25 NOTE — Assessment & Plan Note (Signed)
Last vitamin D Lab Results  Component Value Date   VD25OH 32.09 04/20/2021   Low, to increase oral replacement to 4000 u qd

## 2021-10-25 NOTE — Assessment & Plan Note (Signed)
Lab Results  Component Value Date   VITAMINB12 192 (L) 04/20/2021   Low, to start oral replacement - b12 1000 mcg qd

## 2021-10-28 ENCOUNTER — Other Ambulatory Visit: Payer: Self-pay | Admitting: Internal Medicine

## 2021-11-17 DIAGNOSIS — Z20822 Contact with and (suspected) exposure to covid-19: Secondary | ICD-10-CM | POA: Diagnosis not present

## 2021-11-19 DIAGNOSIS — Z20822 Contact with and (suspected) exposure to covid-19: Secondary | ICD-10-CM | POA: Diagnosis not present

## 2021-11-23 DIAGNOSIS — H401222 Low-tension glaucoma, left eye, moderate stage: Secondary | ICD-10-CM | POA: Diagnosis not present

## 2021-11-23 DIAGNOSIS — H01114 Allergic dermatitis of left upper eyelid: Secondary | ICD-10-CM | POA: Diagnosis not present

## 2021-11-23 DIAGNOSIS — H401211 Low-tension glaucoma, right eye, mild stage: Secondary | ICD-10-CM | POA: Diagnosis not present

## 2021-12-02 ENCOUNTER — Telehealth (INDEPENDENT_AMBULATORY_CARE_PROVIDER_SITE_OTHER): Payer: Medicare HMO | Admitting: Family Medicine

## 2021-12-02 ENCOUNTER — Encounter: Payer: Self-pay | Admitting: Family Medicine

## 2021-12-02 ENCOUNTER — Other Ambulatory Visit: Payer: Self-pay

## 2021-12-02 DIAGNOSIS — U071 COVID-19: Secondary | ICD-10-CM | POA: Diagnosis not present

## 2021-12-02 NOTE — Progress Notes (Signed)
Virtual Visit via Video Note  I connected with Cindy Higgins on 12/02/21 at  4:00 PM EST by a video enabled telemedicine application 2/2 [id number] pandemic and verified that I am speaking with the correct person using two identifiers.  Location patient: home Location provider:work or home office Persons participating in the virtual visit: patient, provider  I discussed the limitations of evaluation and management by telemedicine and the availability of in person appointments. The patient expressed understanding and agreed to proceed.   HPI: Pt tested positive for COVID 12/02/21 Symptoms started Saturday with cough, severe HA, achy, ST, decreased sleep, chills.  Appetite has been ok.  Still has taste and smell Still has a dull HA Denies Diarrhea, ear pain/pressure, fever. Took cough meds, Coricidin HBP Pt's sister at [facility], tested positive for COVID last wk.  Pt had a previous COVID 19 infection 2 yrs ago.  Pt recently celebrated her 63 th wedding anniversary.  Pt states she felt tired and general unwell feeling after her mRNA booster vaccine.  Pt was advised not to have any more vaccines due to her h/o autoimmune d/o.  ROS: See pertinent positives and negatives per HPI.  Past Medical History:  Diagnosis Date   ALLERGIC RHINITIS 12/22/2007   Anxiety    ANXIETY DEPRESSION 08/30/2007   Arthritis    Left thumb bone on bone   BLURRED VISION, INTERMITTENT 12/22/2007   COLONIC POLYPS, HX OF 12/22/2007   01/18/2006   Glaucoma    NTG Glaucoma   GLUCOSE INTOLERANCE 07/08/2009   Headache(784.0) 12/22/2007   Herpes zoster of eye 05/10/2011   HYPERCHOLESTEROLEMIA 08/30/2007   HYPERLIPIDEMIA 03/26/2008   Hypothyroidism 07/24/2020   Impaired glucose tolerance 05/09/2011   MITRAL VALVE PROLAPSE, HX OF 08/30/2007   OSTEOPOROSIS 12/22/2007   POSTMENOPAUSAL STATUS 08/30/2007   SINUSITIS- ACUTE-NOS 02/06/2010    Past Surgical History:  Procedure Laterality Date   ABDOMINAL HYSTERECTOMY      AUGMENTATION MAMMAPLASTY     COLONOSCOPY     COSMETIC SURGERY     DILATION AND CURETTAGE OF UTERUS  2007   OOPHORECTOMY     POLYPECTOMY      Family History  Problem Relation Age of Onset   Heart disease Mother    Diabetes Mother    Hyperlipidemia Mother    Alcohol abuse Father    Breast cancer Sister    Cancer Sister    Breast cancer Sister    Breast cancer Maternal Grandmother    Testicular cancer Son     Current Outpatient Medications:    ALPRAZolam (XANAX) 0.5 MG tablet, TAKE 1 TABLET BY MOUTH TWICE A DAY AS NEEDED, Disp: 60 tablet, Rfl: 2   brimonidine-timolol (COMBIGAN) 0.2-0.5 % ophthalmic solution, Place 1 drop into both eyes every 12 (twelve) hours., Disp: , Rfl:    calcium-vitamin D (OSCAL WITH D) 250-125 MG-UNIT tablet, Take 1 tablet by mouth daily., Disp: , Rfl:    citalopram (CELEXA) 40 MG tablet, TAKE 1 TABLET BY MOUTH EVERY DAY, Disp: 90 tablet, Rfl: 2   cycloSPORINE (RESTASIS) 0.05 % ophthalmic emulsion, Place 1 drop into both eyes 2 (two) times daily., Disp: 0.4 mL, Rfl: 11   estradiol (ESTRACE) 0.5 MG tablet, Take 0.5 mg by mouth daily., Disp: , Rfl:    fluorometholone (FML) 0.1 % ophthalmic suspension, 1 drop every 4 (four) hours., Disp: , Rfl:    latanoprost (XALATAN) 0.005 % ophthalmic solution, 1 drop at bedtime., Disp: , Rfl:    levothyroxine (SYNTHROID) 25 MCG tablet,  TAKE 1 TABLET BY MOUTH ONCE DAILY BEFORE BREAKFAST, Disp: 90 tablet, Rfl: 1   Loteprednol Etabonate (EYSUVIS) 0.25 % SUSP, Apply 1 drop to eye 2 (two) times daily. OU, Disp: , Rfl:    Travoprost, BAK Free, (TRAVATAN) 0.004 % SOLN ophthalmic solution, Place 1 drop into both eyes at bedtime., Disp: 5 mL, Rfl: 11   vitamin B-12 (CYANOCOBALAMIN) 1000 MCG tablet, Take 1 tablet (1,000 mcg total) by mouth daily., Disp: 90 tablet, Rfl: 3  EXAM:  VITALS per patient if applicable: RR between 07-12 bpm  GENERAL: alert, oriented, appears well and in no acute distress  HEENT: atraumatic, conjunctiva  clear, no obvious abnormalities on inspection of external nose and ears  NECK: normal movements of the head and neck  LUNGS: on inspection no signs of respiratory distress, breathing rate appears normal, no obvious gross SOB, gasping or wheezing  CV: no obvious cyanosis  MS: moves all visible extremities without noticeable abnormality  PSYCH/NEURO: pleasant and cooperative, no obvious depression or anxiety, speech and thought processing grossly intact  ASSESSMENT AND PLAN:  Discussed the following assessment and plan:  COVID-19 virus infection -symptoms started 11/28/21.  With Positive test 12/02/21. -given reported improvement in symptoms and relatively mild case will not start antiviral. -Continue supportive care with OTC cough/cold medications, hydration, honey, warm fluids, gargling with warm salt water or Chloraseptic spray, etc. -Given strict precautions  Follow-up as needed   I discussed the assessment and treatment plan with the patient. The patient was provided an opportunity to ask questions and all were answered. The patient agreed with the plan and demonstrated an understanding of the instructions.   The patient was advised to call back or seek an in-person evaluation if the symptoms worsen or if the condition fails to improve as anticipated.   Billie Ruddy, MD

## 2021-12-08 DIAGNOSIS — H01114 Allergic dermatitis of left upper eyelid: Secondary | ICD-10-CM | POA: Diagnosis not present

## 2021-12-08 DIAGNOSIS — H401211 Low-tension glaucoma, right eye, mild stage: Secondary | ICD-10-CM | POA: Diagnosis not present

## 2021-12-08 DIAGNOSIS — H401222 Low-tension glaucoma, left eye, moderate stage: Secondary | ICD-10-CM | POA: Diagnosis not present

## 2021-12-21 ENCOUNTER — Encounter (INDEPENDENT_AMBULATORY_CARE_PROVIDER_SITE_OTHER): Payer: Self-pay | Admitting: Ophthalmology

## 2021-12-21 ENCOUNTER — Ambulatory Visit (INDEPENDENT_AMBULATORY_CARE_PROVIDER_SITE_OTHER): Payer: Medicare HMO | Admitting: Ophthalmology

## 2021-12-21 ENCOUNTER — Other Ambulatory Visit: Payer: Self-pay

## 2021-12-21 NOTE — Assessment & Plan Note (Signed)
She reports blurred vision mostly left eye now

## 2021-12-21 NOTE — Assessment & Plan Note (Addendum)
OD with continued visual acuity from preoperative vision of 20/40 prior to membrane peel to now 20/20 today with correction,,  Some 4.5 months post vitrectomy membrane peel

## 2021-12-28 ENCOUNTER — Ambulatory Visit (INDEPENDENT_AMBULATORY_CARE_PROVIDER_SITE_OTHER): Payer: Medicare HMO | Admitting: Ophthalmology

## 2021-12-28 ENCOUNTER — Encounter (INDEPENDENT_AMBULATORY_CARE_PROVIDER_SITE_OTHER): Payer: Self-pay | Admitting: Ophthalmology

## 2021-12-28 ENCOUNTER — Other Ambulatory Visit: Payer: Self-pay

## 2021-12-28 DIAGNOSIS — H35371 Puckering of macula, right eye: Secondary | ICD-10-CM

## 2021-12-28 NOTE — Assessment & Plan Note (Signed)
OD, looks great post vitrectomy membrane peel with improved acuity

## 2021-12-28 NOTE — Progress Notes (Signed)
12/28/2021     CHIEF COMPLAINT Patient presents for  Chief Complaint  Patient presents with   Retina Evaluation      HISTORY OF PRESENT ILLNESS: Cindy Higgins is a 76 y.o. female who presents to the clinic today for:   HPI   OD, history of vitrectomy membrane peel September 2022.  Recently patient noticed some slight decline in quality of vision  No other change in medical history Last edited by Hurman Horn, MD on 12/28/2021  2:11 PM.      Referring physician: Lisabeth Pick, MD [address] [place],   [address]  HISTORICAL INFORMATION:   Selected notes from the MEDICAL RECORD NUMBER    Lab Results  Component Value Date   HGBA1C 6.0 04/20/2021     CURRENT MEDICATIONS: Current Outpatient Medications (Ophthalmic Drugs)  Medication Sig   brimonidine-timolol (COMBIGAN) 0.2-0.5 % ophthalmic solution Place 1 drop into both eyes every 12 (twelve) hours.   cycloSPORINE (RESTASIS) 0.05 % ophthalmic emulsion Place 1 drop into both eyes 2 (two) times daily.   fluorometholone (FML) 0.1 % ophthalmic suspension 1 drop every 4 (four) hours.   latanoprost (XALATAN) 0.005 % ophthalmic solution 1 drop at bedtime.   Loteprednol Etabonate (EYSUVIS) 0.25 % SUSP Apply 1 drop to eye 2 (two) times daily. OU   Travoprost, BAK Free, (TRAVATAN) 0.004 % SOLN ophthalmic solution Place 1 drop into both eyes at bedtime.   No current facility-administered medications for this visit. (Ophthalmic Drugs)   Current Outpatient Medications (Other)  Medication Sig   ALPRAZolam (XANAX) 0.5 MG tablet TAKE 1 TABLET BY MOUTH TWICE A DAY AS NEEDED   calcium-vitamin D (OSCAL WITH D) 250-125 MG-UNIT tablet Take 1 tablet by mouth daily.   citalopram (CELEXA) 40 MG tablet TAKE 1 TABLET BY MOUTH EVERY DAY   estradiol (ESTRACE) 0.5 MG tablet Take 0.5 mg by mouth daily.   levothyroxine (SYNTHROID) 25 MCG tablet TAKE 1 TABLET BY MOUTH ONCE DAILY BEFORE BREAKFAST   vitamin B-12 (CYANOCOBALAMIN) 1000  MCG tablet Take 1 tablet (1,000 mcg total) by mouth daily.   No current facility-administered medications for this visit. (Other)      REVIEW OF SYSTEMS: ROS   Negative for: Constitutional, Gastrointestinal, Neurological, Skin, Genitourinary, Musculoskeletal, HENT, Endocrine, Cardiovascular, Eyes, Respiratory, Psychiatric, Allergic/Imm, Heme/Lymph Last edited by Hurman Horn, MD on 12/28/2021  2:08 PM.       ALLERGIES Allergies  Allergen Reactions   Sulfa Antibiotics Anaphylaxis   Iodine Other (See Comments)    Burning   Other    Scallops [Shellfish Allergy] Nausea And Vomiting   Codeine Itching and Rash   Penicillins Itching    Has patient had a PCN reaction causing immediate rash, facial/tongue/throat swelling, SOB or lightheadedness with hypotension:  Unknown think it was rash Has patient had a PCN reaction causing severe rash involving mucus membranes or skin necrosis: unknown childhood Has patient had a PCN reaction that required hospitalization: unknown Has patient had a PCN reaction occurring within the last 10 years: no childhood If all of the above answers are "NO", then may proceed with Cephalosporin use.     Shingrix [Zoster Vac Recomb Adjuvanted] Rash    Severe arm erythema and heat    PAST MEDICAL HISTORY Past Medical History:  Diagnosis Date   ALLERGIC RHINITIS 12/22/2007   Anxiety    ANXIETY DEPRESSION 08/30/2007   Arthritis    Left thumb bone on bone   BLURRED VISION, INTERMITTENT 12/22/2007  COLONIC POLYPS, HX OF 12/22/2007   01/18/2006   Glaucoma    NTG Glaucoma   GLUCOSE INTOLERANCE 07/08/2009   Headache(784.0) 12/22/2007   Herpes zoster of eye 05/10/2011   HYPERCHOLESTEROLEMIA 08/30/2007   HYPERLIPIDEMIA 03/26/2008   Hypothyroidism 07/24/2020   Impaired glucose tolerance 05/09/2011   MITRAL VALVE PROLAPSE, HX OF 08/30/2007   OSTEOPOROSIS 12/22/2007   POSTMENOPAUSAL STATUS 08/30/2007   SINUSITIS- ACUTE-NOS 02/06/2010   Past Surgical History:   Procedure Laterality Date   ABDOMINAL HYSTERECTOMY     AUGMENTATION MAMMAPLASTY     COLONOSCOPY     COSMETIC SURGERY     DILATION AND CURETTAGE OF UTERUS  2007   OOPHORECTOMY     POLYPECTOMY      FAMILY HISTORY Family History  Problem Relation Age of Onset   Heart disease Mother    Diabetes Mother    Hyperlipidemia Mother    Alcohol abuse Father    Breast cancer Sister    Cancer Sister    Breast cancer Sister    Breast cancer Maternal Grandmother    Testicular cancer Son     SOCIAL HISTORY Social History   Tobacco Use   Smoking status: Never   Smokeless tobacco: Never  Substance Use Topics   Alcohol use: Yes    Alcohol/week: 2.0 standard drinks    Types: 2 drink(s) per week    Comment: daily   Drug use: No         OPHTHALMIC EXAM:  Base Eye Exam     Visual Acuity (ETDRS)       Right Left   Dist cc 20/25 20/50   Dist ph cc NI 20/40 +2         Tonometry (Tonopen, 2:11 PM)       Right Left   Pressure 10 6         Pupils       Pupils APD   Right PERRL None   Left PERRL          Visual Fields       Left Right    Full Full         Extraocular Movement       Right Left    Full, Ortho Full, Ortho         Neuro/Psych     Oriented x3: Yes   Mood/Affect: Normal         Dilation     Both eyes: 1.0% Mydriacyl, 2.5% Phenylephrine @ 2:12 PM           Slit Lamp and Fundus Exam     External Exam       Right Left   External Normal Normal         Slit Lamp Exam       Right Left   Lids/Lashes Dermatochalasis - upper lid, mild MGD, 2+ Meibomian gland dysfunction Dermatochalasis - upper lid, mild MGD   Conjunctiva/Sclera White and quiet White and quiet   Cornea mild arcus, well healed temporal cataract wounds, trace tear film debris mild arcus, well healed temporal cataract wounds, mild Debris in tear film   Anterior Chamber Deep and quiet Deep and quiet, no cell or flare   Iris Round and dilated Round and dilated    Lens Toric PC IOL, multifocal IOL, open posterior capsule Toric PC IOL, and multifocal, open posterior capsule   Anterior Vitreous mild syneresis mild syneresis, Posterior vitreous detachment         Fundus Exam  Right Left   Posterior Vitreous Clear, avitric mild syneresis, Posterior vitreous detachment   Disc Mild temporal Pallor, Sharp rim, thin inferior rim, temporal PPA, +cupping Mild temporal Pallor, Sharp rim, thin inferior rim, temporal PPA/PPP, +cupping   C/D Ratio 0.65 0.65   Macula Flat, Blunted foveal reflex, no topographic distortion Flat, Blunted foveal reflex, mild RPE mottling and clumping, No heme or edema   Vessels attenuated, Tortuous attenuated, Tortuous, mild Copper wiring, mild AV crossing changes   Periphery Attached, mild reticular degeneration, mild peripheral drusen, No RT/RD Attached, mild reticular degeneration, mild peripheral drusen            IMAGING AND PROCEDURES  Imaging and Procedures for 12/28/21  OCT, Retina - OU - Both Eyes       Right Eye Quality was good. Central Foveal Thickness: 359. Progression has worsened. Findings include abnormal foveal contour, no IRF, no SRF (Mild increase in central pucker and PRF).   Left Eye Quality was good. Scan locations included subfoveal. Central Foveal Thickness: 264. Progression has been stable. Findings include no SRF, normal foveal contour, no IRF.   Notes Vastly improved macular condition now  5 months  post vitrectomy membrane peel    Images captured and stored on drive  Severe epiretinal membrane OD with topographic distortion OD, accounts for acuity  Abbreviations: NFP - Normal foveal profile. CME - cystoid macular edema. PED - pigment epithelial detachment. IRF - intraretinal fluid. SRF - subretinal fluid. EZ - ellipsoid zone. ERM - epiretinal membrane. ORA - outer retinal atrophy. ORT - outer retinal tubulation. SRHM - subretinal hyper-reflective material. IRHM - intraretinal  hyper-reflective material             ASSESSMENT/PLAN:  Macular pucker, right eye OD, looks great post vitrectomy membrane peel with improved acuity     ICD-10-CM   1. Macular pucker, right eye  H35.371     2. Epiretinal membrane (ERM) of right eye  H35.371 OCT, Retina - OU - Both Eyes      1.  OD looks great, improved acuity.  Continue to monitor follow-up in 1 year  2.  3.  Ophthalmic Meds Ordered this visit:  No orders of the defined types were placed in this encounter.      Return in about 1 year (around 12/28/2022) for DILATE OU, OCT.  There are no Patient Instructions on file for this visit.   Explained the diagnoses, plan, and follow up with the patient and they expressed understanding.  Patient expressed understanding of the importance of proper follow up care.   Clent Demark Uriah Trueba M.D. Diseases & Surgery of the Retina and Vitreous [facility] [place] 12/28/21     Abbreviations: M myopia (nearsighted); A astigmatism; H hyperopia (farsighted); P presbyopia; Mrx spectacle prescription;  CTL contact lenses; OD right eye; OS left eye; OU both eyes  XT exotropia; ET esotropia; PEK punctate epithelial keratitis; PEE punctate epithelial erosions; DES dry eye syndrome; MGD meibomian gland dysfunction; ATs artificial tears; PFAT's preservative free artificial tears; [place] nuclear sclerotic cataract; PSC posterior subcapsular cataract; ERM epi-retinal membrane; PVD posterior vitreous detachment; RD retinal detachment; DM diabetes mellitus; DR diabetic retinopathy; NPDR non-proliferative diabetic retinopathy; PDR proliferative diabetic retinopathy; CSME clinically significant macular edema; DME diabetic macular edema; dbh dot blot hemorrhages; CWS cotton wool spot; POAG primary open angle glaucoma; C/D cup-to-disc ratio; HVF humphrey visual field; GVF goldmann visual field; OCT optical coherence tomography; IOP intraocular pressure; BRVO Branch retinal vein occlusion;  CRVO central  retinal vein occlusion; CRAO central retinal artery occlusion; BRAO branch retinal artery occlusion; RT retinal tear; SB scleral buckle; PPV pars plana vitrectomy; VH Vitreous hemorrhage; PRP panretinal laser photocoagulation; IVK intravitreal kenalog; VMT vitreomacular traction; MH Macular hole;  NVD neovascularization of the disc; NVE neovascularization elsewhere; AREDS age related eye disease study; ARMD age related macular degeneration; POAG primary open angle glaucoma; EBMD epithelial/anterior basement membrane dystrophy; ACIOL anterior chamber intraocular lens; IOL intraocular lens; PCIOL posterior chamber intraocular lens; Phaco/IOL phacoemulsification with intraocular lens placement; [place] photorefractive keratectomy; LASIK laser assisted in situ keratomileusis; HTN hypertension; DM diabetes mellitus; COPD chronic obstructive pulmonary disease

## 2022-01-01 DIAGNOSIS — H401211 Low-tension glaucoma, right eye, mild stage: Secondary | ICD-10-CM | POA: Diagnosis not present

## 2022-01-01 DIAGNOSIS — H401222 Low-tension glaucoma, left eye, moderate stage: Secondary | ICD-10-CM | POA: Diagnosis not present

## 2022-03-03 ENCOUNTER — Other Ambulatory Visit: Payer: Self-pay | Admitting: Internal Medicine

## 2022-04-11 ENCOUNTER — Other Ambulatory Visit: Payer: Self-pay | Admitting: Internal Medicine

## 2022-04-11 NOTE — Telephone Encounter (Signed)
Please refill as per office routine med refill policy (all routine meds to be refilled for 3 mo or monthly (per pt preference) up to one year from last visit, then month to month grace period for 3 mo, then further med refills will have to be denied) ? ?

## 2022-04-13 DIAGNOSIS — H401222 Low-tension glaucoma, left eye, moderate stage: Secondary | ICD-10-CM | POA: Diagnosis not present

## 2022-04-13 DIAGNOSIS — H401211 Low-tension glaucoma, right eye, mild stage: Secondary | ICD-10-CM | POA: Diagnosis not present

## 2022-04-13 DIAGNOSIS — H43392 Other vitreous opacities, left eye: Secondary | ICD-10-CM | POA: Diagnosis not present

## 2022-04-16 ENCOUNTER — Other Ambulatory Visit (INDEPENDENT_AMBULATORY_CARE_PROVIDER_SITE_OTHER): Payer: Medicare HMO

## 2022-04-16 DIAGNOSIS — E538 Deficiency of other specified B group vitamins: Secondary | ICD-10-CM | POA: Diagnosis not present

## 2022-04-16 DIAGNOSIS — E7800 Pure hypercholesterolemia, unspecified: Secondary | ICD-10-CM | POA: Diagnosis not present

## 2022-04-16 DIAGNOSIS — R7302 Impaired glucose tolerance (oral): Secondary | ICD-10-CM | POA: Diagnosis not present

## 2022-04-16 DIAGNOSIS — E559 Vitamin D deficiency, unspecified: Secondary | ICD-10-CM

## 2022-04-16 LAB — HEPATIC FUNCTION PANEL
ALT: 23 U/L (ref 0–35)
AST: 22 U/L (ref 0–37)
Albumin: 4.1 g/dL (ref 3.5–5.2)
Alkaline Phosphatase: 53 U/L (ref 39–117)
Bilirubin, Direct: 0.1 mg/dL (ref 0.0–0.3)
Total Bilirubin: 0.4 mg/dL (ref 0.2–1.2)
Total Protein: 7.3 g/dL (ref 6.0–8.3)

## 2022-04-16 LAB — CBC WITH DIFFERENTIAL/PLATELET
Basophils Absolute: 0 10*3/uL (ref 0.0–0.1)
Basophils Relative: 0.2 % (ref 0.0–3.0)
Eosinophils Absolute: 0.2 10*3/uL (ref 0.0–0.7)
Eosinophils Relative: 3.5 % (ref 0.0–5.0)
HCT: 40.8 % (ref 36.0–46.0)
Hemoglobin: 13.9 g/dL (ref 12.0–15.0)
Lymphocytes Relative: 33.6 % (ref 12.0–46.0)
Lymphs Abs: 1.8 10*3/uL (ref 0.7–4.0)
MCHC: 34 g/dL (ref 30.0–36.0)
MCV: 102.4 fl — ABNORMAL HIGH (ref 78.0–100.0)
Monocytes Absolute: 0.5 10*3/uL (ref 0.1–1.0)
Monocytes Relative: 10.2 % (ref 3.0–12.0)
Neutro Abs: 2.8 10*3/uL (ref 1.4–7.7)
Neutrophils Relative %: 52.5 % (ref 43.0–77.0)
Platelets: 305 10*3/uL (ref 150.0–400.0)
RBC: 3.99 Mil/uL (ref 3.87–5.11)
RDW: 13 % (ref 11.5–15.5)
WBC: 5.3 10*3/uL (ref 4.0–10.5)

## 2022-04-16 LAB — TSH: TSH: 3.74 u[IU]/mL (ref 0.35–5.50)

## 2022-04-16 LAB — LIPID PANEL
Cholesterol: 257 mg/dL — ABNORMAL HIGH (ref 0–200)
HDL: 70.9 mg/dL (ref >39.00)
LDL Cholesterol: 152 mg/dL — ABNORMAL HIGH (ref 0–99)
NonHDL: 186.02
Total CHOL/HDL Ratio: 4
Triglycerides: 170 mg/dL — ABNORMAL HIGH (ref 0.0–149.0)
VLDL: 34 mg/dL (ref 0.0–40.0)

## 2022-04-16 LAB — URINALYSIS, ROUTINE W REFLEX MICROSCOPIC
Bilirubin Urine: NEGATIVE
Hgb urine dipstick: NEGATIVE
Ketones, ur: NEGATIVE
Leukocytes,Ua: NEGATIVE
Nitrite: NEGATIVE
Specific Gravity, Urine: 1.01 (ref 1.000–1.030)
Total Protein, Urine: NEGATIVE
Urine Glucose: NEGATIVE
Urobilinogen, UA: 0.2 (ref 0.0–1.0)
pH: 6 (ref 5.0–8.0)

## 2022-04-16 LAB — BASIC METABOLIC PANEL
BUN: 10 mg/dL (ref 6–23)
CO2: 29 mEq/L (ref 19–32)
Calcium: 9.6 mg/dL (ref 8.4–10.5)
Chloride: 101 mEq/L (ref 96–112)
Creatinine, Ser: 0.68 mg/dL (ref 0.40–1.20)
GFR: 86.27 mL/min (ref >60.00)
Glucose, Bld: 100 mg/dL — ABNORMAL HIGH (ref 70–99)
Potassium: 4.8 mEq/L (ref 3.5–5.1)
Sodium: 136 mEq/L (ref 135–145)

## 2022-04-16 LAB — HEMOGLOBIN A1C: Hgb A1c MFr Bld: 6 % (ref 4.6–6.5)

## 2022-04-16 LAB — VITAMIN B12: Vitamin B-12: 1366 pg/mL — ABNORMAL HIGH (ref 211–911)

## 2022-04-16 LAB — VITAMIN D 25 HYDROXY (VIT D DEFICIENCY, FRACTURES): VITD: 71.53 ng/mL (ref 30.00–100.00)

## 2022-04-22 ENCOUNTER — Ambulatory Visit (INDEPENDENT_AMBULATORY_CARE_PROVIDER_SITE_OTHER): Payer: Medicare HMO

## 2022-04-22 ENCOUNTER — Ambulatory Visit (INDEPENDENT_AMBULATORY_CARE_PROVIDER_SITE_OTHER): Payer: Medicare HMO | Admitting: Internal Medicine

## 2022-04-22 ENCOUNTER — Encounter: Payer: Self-pay | Admitting: Internal Medicine

## 2022-04-22 VITALS — BP 126/70 | HR 75 | Temp 98.2°F | Ht 64.0 in | Wt 138.0 lb

## 2022-04-22 DIAGNOSIS — R0600 Dyspnea, unspecified: Secondary | ICD-10-CM | POA: Diagnosis not present

## 2022-04-22 DIAGNOSIS — E039 Hypothyroidism, unspecified: Secondary | ICD-10-CM | POA: Diagnosis not present

## 2022-04-22 DIAGNOSIS — I10 Essential (primary) hypertension: Secondary | ICD-10-CM | POA: Diagnosis not present

## 2022-04-22 DIAGNOSIS — R3129 Other microscopic hematuria: Secondary | ICD-10-CM

## 2022-04-22 DIAGNOSIS — Z0001 Encounter for general adult medical examination with abnormal findings: Secondary | ICD-10-CM

## 2022-04-22 DIAGNOSIS — E559 Vitamin D deficiency, unspecified: Secondary | ICD-10-CM | POA: Diagnosis not present

## 2022-04-22 DIAGNOSIS — E7800 Pure hypercholesterolemia, unspecified: Secondary | ICD-10-CM | POA: Diagnosis not present

## 2022-04-22 DIAGNOSIS — E538 Deficiency of other specified B group vitamins: Secondary | ICD-10-CM | POA: Diagnosis not present

## 2022-04-22 DIAGNOSIS — R7302 Impaired glucose tolerance (oral): Secondary | ICD-10-CM | POA: Diagnosis not present

## 2022-04-22 MED ORDER — CITALOPRAM HYDROBROMIDE 40 MG PO TABS: 40.0000 mg | ORAL_TABLET | Freq: Every day | ORAL | 3 refills | Status: DC

## 2022-04-22 MED ORDER — LEVOTHYROXINE SODIUM 25 MCG PO TABS: 25.0000 ug | ORAL_TABLET | Freq: Every day | ORAL | 3 refills | Status: DC

## 2022-04-22 MED ORDER — ALPRAZOLAM 0.5 MG PO TABS: 0.5000 mg | ORAL_TABLET | Freq: Two times a day (BID) | ORAL | 2 refills | Status: DC | PRN

## 2022-04-22 NOTE — Progress Notes (Signed)
Patient ID: Cindy Higgins, female   DOB: [birth year], 76 y.o.   MRN: [id number]         Chief Complaint:: wellness exam and Follow-up Hyperglycemia, microhematuria, hld, low b12, dyspnea       HPI:  Cindy Higgins is a 76 y.o. female here for wellness exam; decliens covid booster, o/w up to date                        Also struggling with right glucama sp 3 surgies, may need 4th.  S/p bilateral cataract.  Has some mild vision loss only in the rigth, having ocular migraine in left.  Has new optho . Pt denies chest pain, increased sob or doe, wheezing, orthopnea, PND, increased LE swelling, palpitations, dizziness or syncope, except for mild sob with exertion over last yr despite no wt change.   Pt denies polydipsia, polyuria, or new focal neuro s/s.    Pt denies fever, wt loss, night sweats, loss of appetite, or other constitutional symptoms  taking B12.  Delcines statin due to intolerance.  Denies urinary symptoms such as dysuria, frequency, urgency, flank pain, hematuria or n/v, fever, chills. Pt interested in Cardaic Ct score.  Denies hyper or hypo thyroid symptoms such as voice, skin or hair change.  Wt Readings from Last 3 Encounters:  04/22/22 138 lb (62.6 kg)  10/20/21 141 lb 6.4 oz (64.1 kg)  07/06/21 141 lb 9.6 oz (64.2 kg)   BP Readings from Last 3 Encounters:  04/22/22 126/70  10/20/21 138/78  07/06/21 120/60   Immunization History  Administered Date(s) Administered   Fluad Quad(high Dose 65+) 07/02/2019, 07/24/2020, 10/20/2021   Influenza Inj Mdck Quad Pf 09/10/2017   Influenza, High Dose Seasonal PF 12/09/2014, 08/10/2018, 06/28/2019   Influenza,inj,Quad PF,6+ Mos 08/16/2014, 08/21/2015   Influenza-Unspecified 11/09/2015, 08/08/2017   PFIZER(Purple Top)SARS-COV-2 Vaccination 01/14/2020, 02/13/2020, 09/27/2020, 09/15/2021   Pneumococcal Conjugate-13 12/09/2014, 08/21/2015   Pneumococcal Polysaccharide-23 08/20/2014   Td 11/17/2009   Tdap 07/24/2020   Unspecified SARS-COV-2  Vaccination 01/14/2020, 02/13/2020   Zoster Recombinat (Shingrix) 06/08/2021   Zoster, Live 05/10/2011   There are no preventive care reminders to display for this patient.     Past Medical History:  Diagnosis Date   ALLERGIC RHINITIS 12/22/2007   Anxiety    ANXIETY DEPRESSION 08/30/2007   Arthritis    Left thumb bone on bone   BLURRED VISION, INTERMITTENT 12/22/2007   COLONIC POLYPS, HX OF 12/22/2007   01/18/2006   Glaucoma    NTG Glaucoma   GLUCOSE INTOLERANCE 07/08/2009   Headache(784.0) 12/22/2007   Herpes zoster of eye 05/10/2011   HYPERCHOLESTEROLEMIA 08/30/2007   HYPERLIPIDEMIA 03/26/2008   Hypothyroidism 07/24/2020   Impaired glucose tolerance 05/09/2011   MITRAL VALVE PROLAPSE, HX OF 08/30/2007   OSTEOPOROSIS 12/22/2007   POSTMENOPAUSAL STATUS 08/30/2007   SINUSITIS- ACUTE-NOS 02/06/2010   Past Surgical History:  Procedure Laterality Date   ABDOMINAL HYSTERECTOMY     AUGMENTATION MAMMAPLASTY     COLONOSCOPY     COSMETIC SURGERY     DILATION AND CURETTAGE OF UTERUS  2007   OOPHORECTOMY     POLYPECTOMY      reports that she has never smoked. She has never used smokeless tobacco. She reports current alcohol use of about 2.0 standard drinks of alcohol per week. She reports that she does not use drugs. family history includes Alcohol abuse in her father; Breast cancer in her maternal grandmother, sister, and sister; Cancer in  her sister; Diabetes in her mother; Heart disease in her mother; Hyperlipidemia in her mother; Testicular cancer in her son. Allergies  Allergen Reactions   Sulfa Antibiotics Anaphylaxis   Iodine Other (See Comments)    Burning   Other    Scallops [Shellfish Allergy] Nausea And Vomiting   Statins Other (See Comments)   Zetia [Ezetimibe] Diarrhea   Codeine Itching and Rash   Penicillins Itching    Has patient had a PCN reaction causing immediate rash, facial/tongue/throat swelling, SOB or lightheadedness with hypotension:  Unknown think it was  rash Has patient had a PCN reaction causing severe rash involving mucus membranes or skin necrosis: unknown childhood Has patient had a PCN reaction that required hospitalization: unknown Has patient had a PCN reaction occurring within the last 10 years: no childhood If all of the above answers are "NO", then may proceed with Cephalosporin use.     Shingrix [Zoster Vac Recomb Adjuvanted] Rash    Severe arm erythema and heat   Current Outpatient Medications on File Prior to Visit  Medication Sig Dispense Refill   brimonidine-timolol (COMBIGAN) 0.2-0.5 % ophthalmic solution Place 1 drop into both eyes every 12 (twelve) hours.     calcium-vitamin D (OSCAL WITH D) 250-125 MG-UNIT tablet Take 1 tablet by mouth daily.     cycloSPORINE (RESTASIS) 0.05 % ophthalmic emulsion Place 1 drop into both eyes 2 (two) times daily. 0.4 mL 11   estradiol (ESTRACE) 0.5 MG tablet Take 0.5 mg by mouth daily.     fluorometholone (FML) 0.1 % ophthalmic suspension 1 drop every 4 (four) hours.     latanoprost (XALATAN) 0.005 % ophthalmic solution 1 drop at bedtime.     Loteprednol Etabonate (EYSUVIS) 0.25 % SUSP Apply 1 drop to eye 2 (two) times daily. OU     Travoprost, BAK Free, (TRAVATAN) 0.004 % SOLN ophthalmic solution Place 1 drop into both eyes at bedtime. 5 mL 11   vitamin B-12 (CYANOCOBALAMIN) 1000 MCG tablet Take 1 tablet (1,000 mcg total) by mouth daily. 90 tablet 3   No current facility-administered medications on file prior to visit.        ROS:  All others reviewed and negative.  Objective        PE:  BP 126/70 (BP Location: Right Arm, Patient Position: Sitting, Cuff Size: Normal)   Pulse 75   Temp 98.2 F (36.8 C) (Oral)   Ht 5\' 4"  (1.626 m)   Wt 138 lb (62.6 kg)   SpO2 98%   BMI 23.69 kg/m                 Constitutional: Pt appears in NAD               HENT: Head: NCAT.                Right Ear: External ear normal.                 Left Ear: External ear normal.                Eyes: .  Pupils are equal, round, and reactive to light. Conjunctivae and EOM are normal               Nose: without d/c or deformity               Neck: Neck supple. Gross normal ROM               Cardiovascular: Normal  rate and regular rhythm.                 Pulmonary/Chest: Effort normal and breath sounds without rales or wheezing.                Abd:  Soft, NT, ND, + BS, no organomegaly               Neurological: Pt is alert. At baseline orientation, motor grossly intact               Skin: Skin is warm. No rashes, no other new lesions, LE edema - none               Psychiatric: Pt behavior is normal without agitation   Micro: none  Cardiac tracings I have personally interpreted today:  none  Pertinent Radiological findings (summarize): none   Lab Results  Component Value Date   WBC 5.3 04/16/2022   HGB 13.9 04/16/2022   HCT 40.8 04/16/2022   PLT 305.0 04/16/2022   GLUCOSE 100 (H) 04/16/2022   CHOL 257 (H) 04/16/2022   TRIG 170.0 (H) 04/16/2022   HDL 70.90 04/16/2022   LDLDIRECT 190.0 04/20/2021   LDLCALC 152 (H) 04/16/2022   ALT 23 04/16/2022   AST 22 04/16/2022   NA 136 04/16/2022   K 4.8 04/16/2022   CL 101 04/16/2022   CREATININE 0.68 04/16/2022   BUN 10 04/16/2022   CO2 29 04/16/2022   TSH 3.74 04/16/2022   HGBA1C 6.0 04/16/2022   Assessment/Plan:  Cindy Higgins is a 76 y.o. White or Caucasian [1] female with  has a past medical history of ALLERGIC RHINITIS (12/22/2007), Anxiety, ANXIETY DEPRESSION (08/30/2007), Arthritis, BLURRED VISION, INTERMITTENT (12/22/2007), COLONIC POLYPS, HX OF (12/22/2007), Glaucoma, GLUCOSE INTOLERANCE (07/08/2009), Headache(784.0) (12/22/2007), Herpes zoster of eye (05/10/2011), HYPERCHOLESTEROLEMIA (08/30/2007), HYPERLIPIDEMIA (03/26/2008), Hypothyroidism (07/24/2020), Impaired glucose tolerance (05/09/2011), MITRAL VALVE PROLAPSE, HX OF (08/30/2007), OSTEOPOROSIS (12/22/2007), POSTMENOPAUSAL STATUS (08/30/2007), and SINUSITIS- ACUTE-NOS  (02/06/2010).  Encounter for well adult exam with abnormal findings Age and sex appropriate education and counseling updated with regular exercise and diet Referrals for preventative services - none needed Immunizations addressed - declines covid booster Smoking counseling  - none needed Evidence for depression or other mood disorder - chronic anxiety stable Most recent labs reviewed. I have personally reviewed and have noted: 1) the patient's medical and social history 2) The patient's current medications and supplements 3) The patient's height, weight, and BMI have been recorded in the chart   Vitamin d deficiency Last vitamin D Lab Results  Component Value Date   VD25OH 71.53 04/16/2022   Stable, cont oral replacement   Impaired glucose tolerance Lab Results  Component Value Date   HGBA1C 6.0 04/16/2022   Stable, pt to continue current medical treatment  - diet, wt control   Hypothyroidism Lab Results  Component Value Date   TSH 3.74 04/16/2022   Stable, pt to continue levotyroxine  Hypertension, uncontrolled BP Readings from Last 3 Encounters:  04/22/22 126/70  10/20/21 138/78  07/06/21 120/60   Stable, pt to continue medical treatment  - wt control, exercise, low salt diet   HYPERCHOLESTEROLEMIA Lab Results  Component Value Date   LDLCALC 152 (H) 04/16/2022   Severe uncontrolled,, pt to continue current low chol diet, has been statin intolerant,ok for cardiac CT score   B12 deficiency Lab Results  Component Value Date   VITAMINB12 1,366 (H) 04/16/2022   Overcontrolled, to cont oral replacement - b12 1000 mcg at reduced  2 x per wk  Dyspnea Exam benign, ? Anxiety, declines PFTs but for cxr, echo, and ecg reviewed  Microhematuria Noted on UA with labs, asympt, for urology referral r/o malignancy  Followup: Return in about 6 months (around 10/22/2022).  Oliver Barre, MD 04/24/2022 9:02 PM [facility]

## 2022-04-22 NOTE — Patient Instructions (Addendum)
You EKG was done today  Ok to reduce the B12 to twice per wk  Please continue all other medications as before, and refills have been done if requested - the xanax was done by computer  Please have the pharmacy call with any other refills you may need.  Please continue your efforts at being more active, low cholesterol diet, and weight control.  You are otherwise up to date with prevention measures today.  Please keep your appointments with your specialists as you may have planned  You will be contacted regarding the referral for:  Urology, as well as Echocardiogram  Please go to the XRAY Department in the first floor for the x-ray testing  You will be contacted by phone if any changes need to be made immediately.  Otherwise, you will receive a letter about your results with an explanation, but please check with MyChart first.  Please remember to sign up for MyChart if you have not done so, as this will be important to you in the future with finding out test results, communicating by private email, and scheduling acute appointments online when needed.  Please make an Appointment to return in 6 months, or sooner if needed

## 2022-04-24 ENCOUNTER — Encounter: Payer: Self-pay | Admitting: Internal Medicine

## 2022-04-24 DIAGNOSIS — R3129 Other microscopic hematuria: Secondary | ICD-10-CM | POA: Insufficient documentation

## 2022-04-24 NOTE — Assessment & Plan Note (Addendum)
Exam benign, ? Anxiety, declines PFTs but for cxr, echo, and ecg reviewed

## 2022-04-24 NOTE — Assessment & Plan Note (Signed)
BP Readings from Last 3 Encounters:  04/22/22 126/70  10/20/21 138/78  07/06/21 120/60   Stable, pt to continue medical treatment  - wt control, exercise, low salt diet

## 2022-04-24 NOTE — Assessment & Plan Note (Signed)
Lab Results  Component Value Date   HGBA1C 6.0 04/16/2022   Stable, pt to continue current medical treatment  - diet, wt control

## 2022-04-24 NOTE — Assessment & Plan Note (Signed)
Lab Results  Component Value Date   VITAMINB12 1,366 (H) 04/16/2022   Overcontrolled, to cont oral replacement - b12 1000 mcg at reduced 2 x per wk

## 2022-04-24 NOTE — Assessment & Plan Note (Signed)
Noted on UA with labs, asympt, for urology referral r/o malignancy

## 2022-04-24 NOTE — Assessment & Plan Note (Signed)
Lab Results  Component Value Date   LDLCALC 152 (H) 04/16/2022   Severe uncontrolled,, pt to continue current low chol diet, has been statin intolerant,ok for cardiac CT score

## 2022-04-24 NOTE — Assessment & Plan Note (Signed)
Last vitamin D Lab Results  Component Value Date   VD25OH 71.53 04/16/2022   Stable, cont oral replacement  

## 2022-04-24 NOTE — Assessment & Plan Note (Signed)
Lab Results  Component Value Date   TSH 3.74 04/16/2022   Stable, pt to continue levotyroxine

## 2022-04-24 NOTE — Assessment & Plan Note (Signed)
Age and sex appropriate education and counseling updated with regular exercise and diet Referrals for preventative services - none needed Immunizations addressed - declines covid booster Smoking counseling  - none needed Evidence for depression or other mood disorder - chronic anxiety stable Most recent labs reviewed. I have personally reviewed and have noted: 1) the patient's medical and social history 2) The patient's current medications and supplements 3) The patient's height, weight, and BMI have been recorded in the chart

## 2022-04-27 ENCOUNTER — Telehealth: Payer: Self-pay | Admitting: Internal Medicine

## 2022-04-27 NOTE — Telephone Encounter (Signed)
Pt called in and states insurance information on referral to  [facility] is incorrect.   The letter in chart states pt has [employer]- but pt has [employer].  Pt called [facility] and they state that referral must have current insurance information.   Please advise .

## 2022-05-06 ENCOUNTER — Other Ambulatory Visit: Payer: Self-pay | Admitting: Internal Medicine

## 2022-05-06 DIAGNOSIS — R3121 Asymptomatic microscopic hematuria: Secondary | ICD-10-CM | POA: Diagnosis not present

## 2022-05-06 DIAGNOSIS — R350 Frequency of micturition: Secondary | ICD-10-CM | POA: Diagnosis not present

## 2022-05-06 NOTE — Telephone Encounter (Signed)
Please refill as per office routine med refill policy (all routine meds to be refilled for 3 mo or monthly (per pt preference) up to one year from last visit, then month to month grace period for 3 mo, then further med refills will have to be denied) ? ?

## 2022-05-12 DIAGNOSIS — H401211 Low-tension glaucoma, right eye, mild stage: Secondary | ICD-10-CM | POA: Diagnosis not present

## 2022-05-12 DIAGNOSIS — H401222 Low-tension glaucoma, left eye, moderate stage: Secondary | ICD-10-CM | POA: Diagnosis not present

## 2022-05-12 NOTE — Addendum Note (Signed)
Addended by: Terence Lux A on: 05/12/2022 02:31 PM   Modules accepted: Orders

## 2022-05-17 ENCOUNTER — Encounter: Payer: Self-pay | Admitting: Internal Medicine

## 2022-05-24 DIAGNOSIS — H401233 Low-tension glaucoma, bilateral, severe stage: Secondary | ICD-10-CM | POA: Diagnosis not present

## 2022-05-25 ENCOUNTER — Ambulatory Visit (HOSPITAL_COMMUNITY): Payer: Medicare HMO | Attending: Cardiology

## 2022-05-25 DIAGNOSIS — R0600 Dyspnea, unspecified: Secondary | ICD-10-CM | POA: Diagnosis not present

## 2022-05-25 LAB — ECHOCARDIOGRAM COMPLETE
Area-P 1/2: 4.03 cm2
MV M vel: 5.23 m/s
MV Peak grad: 109.3 mmHg
S' Lateral: 2.5 cm

## 2022-05-27 DIAGNOSIS — R3121 Asymptomatic microscopic hematuria: Secondary | ICD-10-CM | POA: Diagnosis not present

## 2022-05-27 DIAGNOSIS — R319 Hematuria, unspecified: Secondary | ICD-10-CM | POA: Diagnosis not present

## 2022-05-27 DIAGNOSIS — N2889 Other specified disorders of kidney and ureter: Secondary | ICD-10-CM | POA: Diagnosis not present

## 2022-05-27 DIAGNOSIS — K760 Fatty (change of) liver, not elsewhere classified: Secondary | ICD-10-CM | POA: Diagnosis not present

## 2022-06-11 DIAGNOSIS — Z01419 Encounter for gynecological examination (general) (routine) without abnormal findings: Secondary | ICD-10-CM | POA: Diagnosis not present

## 2022-06-11 DIAGNOSIS — L900 Lichen sclerosus et atrophicus: Secondary | ICD-10-CM | POA: Insufficient documentation

## 2022-06-11 DIAGNOSIS — N951 Menopausal and female climacteric states: Secondary | ICD-10-CM | POA: Diagnosis not present

## 2022-06-11 DIAGNOSIS — N952 Postmenopausal atrophic vaginitis: Secondary | ICD-10-CM | POA: Diagnosis not present

## 2022-06-11 DIAGNOSIS — N904 Leukoplakia of vulva: Secondary | ICD-10-CM | POA: Insufficient documentation

## 2022-06-14 ENCOUNTER — Other Ambulatory Visit: Payer: Self-pay | Admitting: Obstetrics and Gynecology

## 2022-06-14 DIAGNOSIS — Z1231 Encounter for screening mammogram for malignant neoplasm of breast: Secondary | ICD-10-CM

## 2022-06-23 ENCOUNTER — Other Ambulatory Visit: Payer: Self-pay | Admitting: Internal Medicine

## 2022-06-25 DIAGNOSIS — R3121 Asymptomatic microscopic hematuria: Secondary | ICD-10-CM | POA: Diagnosis not present

## 2022-06-28 ENCOUNTER — Telehealth: Payer: Self-pay | Admitting: Internal Medicine

## 2022-06-28 NOTE — Telephone Encounter (Signed)
LVM for pt to rtn my call to schedule AWV with [facility] call back # [phone] 

## 2022-07-14 ENCOUNTER — Ambulatory Visit (INDEPENDENT_AMBULATORY_CARE_PROVIDER_SITE_OTHER): Payer: Medicare HMO

## 2022-07-14 VITALS — BP 120/60 | HR 98 | Temp 97.6°F | Resp 16 | Ht 64.0 in | Wt 141.8 lb

## 2022-07-14 DIAGNOSIS — Z13820 Encounter for screening for osteoporosis: Secondary | ICD-10-CM

## 2022-07-14 DIAGNOSIS — Z0000 Encounter for general adult medical examination without abnormal findings: Secondary | ICD-10-CM

## 2022-07-14 NOTE — Patient Instructions (Signed)
[name] , Thank you for taking time to come for your Medicare Wellness Visit. I appreciate your ongoing commitment to your health goals. Please review the following plan we discussed and let me know if I can assist you in the future.   Screening recommendations/referrals: Colonoscopy: 05/18/2013; due every 10 years (due 05/19/2023) Mammogram: 07/15/2021; scheduled for 08/02/2022 Bone Density: 04/10/2018; due every 2 years ([facility]) Recommended yearly ophthalmology/optometry visit for glaucoma screening and checkup Recommended yearly dental visit for hygiene and checkup  Vaccinations: Influenza vaccine: 10/20/2021; due Fall 2023 (High Dose for 65+) Pneumococcal vaccine: 08/20/2014, 08/21/2015 Tdap vaccine: 07/24/2020; due every 10 years Shingles vaccine: 06/08/2021; discontinued   Covid-19: 01/14/2020, 02/13/2020, 09/27/2020, 09/15/2021  Advanced directives: Yes; Please bring a copy of your health care power of attorney and living will to the office at your convenience.  Conditions/risks identified: Yes  Next appointment: Please schedule your next Medicare Wellness Visit with your Nurse Health Advisor in 1 year by calling [phone].   Preventive Care 24 Years and Older, Female Preventive care refers to lifestyle choices and visits with your health care provider that can promote health and wellness. What does preventive care include? A yearly physical exam. This is also called an annual well check. Dental exams once or twice a year. Routine eye exams. Ask your health care provider how often you should have your eyes checked. Personal lifestyle choices, including: Daily care of your teeth and gums. Regular physical activity. Eating a healthy diet. Avoiding tobacco and drug use. Limiting alcohol use. Practicing safe sex. Taking low-dose aspirin every day. Taking vitamin and mineral supplements as recommended by your health care provider. What happens during an annual well  check? The services and screenings done by your health care provider during your annual well check will depend on your age, overall health, lifestyle risk factors, and family history of disease. Counseling  Your health care provider may ask you questions about your: Alcohol use. Tobacco use. Drug use. Emotional well-being. Home and relationship well-being. Sexual activity. Eating habits. History of falls. Memory and ability to understand (cognition). Work and work Statistician. Reproductive health. Screening  You may have the following tests or measurements: Height, weight, and BMI. Blood pressure. Lipid and cholesterol levels. These may be checked every 5 years, or more frequently if you are over 37 years old. Skin check. Lung cancer screening. You may have this screening every year starting at age 9 if you have a 30-pack-year history of smoking and currently smoke or have quit within the past 15 years. Fecal occult blood test (FOBT) of the stool. You may have this test every year starting at age 26. Flexible sigmoidoscopy or colonoscopy. You may have a sigmoidoscopy every 5 years or a colonoscopy every 10 years starting at age 81. Hepatitis C blood test. Hepatitis B blood test. Sexually transmitted disease (STD) testing. Diabetes screening. This is done by checking your blood sugar (glucose) after you have not eaten for a while (fasting). You may have this done every 1-3 years. Bone density scan. This is done to screen for osteoporosis. You may have this done starting at age 62. Mammogram. This may be done every 1-2 years. Talk to your health care provider about how often you should have regular mammograms. Talk with your health care provider about your test results, treatment options, and if necessary, the need for more tests. Vaccines  Your health care provider may recommend certain vaccines, such as: Influenza vaccine. This is recommended every year. Tetanus,  diphtheria, and  acellular pertussis (Tdap, Td) vaccine. You may need a Td booster every 10 years. Zoster vaccine. You may need this after age 65. Pneumococcal 13-valent conjugate (PCV13) vaccine. One dose is recommended after age 37. Pneumococcal polysaccharide (PPSV23) vaccine. One dose is recommended after age 32. Talk to your health care provider about which screenings and vaccines you need and how often you need them. This information is not intended to replace advice given to you by your health care provider. Make sure you discuss any questions you have with your health care provider. Document Released: 11/21/2015 Document Revised: 07/14/2016 Document Reviewed: 08/26/2015 Elsevier Interactive Patient Education  2017 [employer] Prevention in the Home Falls can cause injuries. They can happen to people of all ages. There are many things you can do to make your home safe and to help prevent falls. What can I do on the outside of my home? Regularly fix the edges of walkways and driveways and fix any cracks. Remove anything that might make you trip as you walk through a door, such as a raised step or threshold. Trim any bushes or trees on the path to your home. Use bright outdoor lighting. Clear any walking paths of anything that might make someone trip, such as rocks or tools. Regularly check to see if handrails are loose or broken. Make sure that both sides of any steps have handrails. Any raised decks and porches should have guardrails on the edges. Have any leaves, snow, or ice cleared regularly. Use sand or salt on walking paths during winter. Clean up any spills in your garage right away. This includes oil or grease spills. What can I do in the bathroom? Use night lights. Install grab bars by the toilet and in the tub and shower. Do not use towel bars as grab bars. Use non-skid mats or decals in the tub or shower. If you need to sit down in the shower, use a plastic, non-slip stool. Keep  the floor dry. Clean up any water that spills on the floor as soon as it happens. Remove soap buildup in the tub or shower regularly. Attach bath mats securely with double-sided non-slip rug tape. Do not have throw rugs and other things on the floor that can make you trip. What can I do in the bedroom? Use night lights. Make sure that you have a light by your bed that is easy to reach. Do not use any sheets or blankets that are too big for your bed. They should not hang down onto the floor. Have a firm chair that has side arms. You can use this for support while you get dressed. Do not have throw rugs and other things on the floor that can make you trip. What can I do in the kitchen? Clean up any spills right away. Avoid walking on wet floors. Keep items that you use a lot in easy-to-reach places. If you need to reach something above you, use a strong step stool that has a grab bar. Keep electrical cords out of the way. Do not use floor polish or wax that makes floors slippery. If you must use wax, use non-skid floor wax. Do not have throw rugs and other things on the floor that can make you trip. What can I do with my stairs? Do not leave any items on the stairs. Make sure that there are handrails on both sides of the stairs and use them. Fix handrails that are broken or loose. Make  sure that handrails are as long as the stairways. Check any carpeting to make sure that it is firmly attached to the stairs. Fix any carpet that is loose or worn. Avoid having throw rugs at the top or bottom of the stairs. If you do have throw rugs, attach them to the floor with carpet tape. Make sure that you have a light switch at the top of the stairs and the bottom of the stairs. If you do not have them, ask someone to add them for you. What else can I do to help prevent falls? Wear shoes that: Do not have high heels. Have rubber bottoms. Are comfortable and fit you well. Are closed at the toe. Do not  wear sandals. If you use a stepladder: Make sure that it is fully opened. Do not climb a closed stepladder. Make sure that both sides of the stepladder are locked into place. Ask someone to hold it for you, if possible. Clearly mark and make sure that you can see: Any grab bars or handrails. First and last steps. Where the edge of each step is. Use tools that help you move around (mobility aids) if they are needed. These include: Canes. Walkers. Scooters. Crutches. Turn on the lights when you go into a dark area. Replace any light bulbs as soon as they burn out. Set up your furniture so you have a clear path. Avoid moving your furniture around. If any of your floors are uneven, fix them. If there are any pets around you, be aware of where they are. Review your medicines with your doctor. Some medicines can make you feel dizzy. This can increase your chance of falling. Ask your doctor what other things that you can do to help prevent falls. This information is not intended to replace advice given to you by your health care provider. Make sure you discuss any questions you have with your health care provider. Document Released: 08/21/2009 Document Revised: 04/01/2016 Document Reviewed: 11/29/2014 Elsevier Interactive Patient Education  2017 [employer].

## 2022-07-14 NOTE — Progress Notes (Signed)
Subjective:   Cindy Higgins is a 76 y.o. female who presents for Medicare Annual (Subsequent) preventive examination.  Review of Systems     Cardiac Risk Factors include: advanced age (>99mn, >>60women);dyslipidemia;family history of premature cardiovascular disease;hypertension;sedentary lifestyle     Objective:    Today's Vitals   07/14/22 0922  BP: 120/60  Pulse: 98  Resp: 16  Temp: 97.6 F (36.4 C)  TempSrc: Temporal  SpO2: 95%  Weight: 141 lb 12.8 oz (64.3 kg)  Height: '5\' 4"'$  (1.626 m)  PainSc: 0-No pain   Body mass index is 24.34 kg/m.     07/14/2022   12:02 PM 07/06/2021    3:59 PM 12/10/2016    6:33 AM  Advanced Directives  Does Patient Have a Medical Advance Directive? Yes Yes No  Type of [place]  Does patient want to make changes to medical advance directive?  No - Patient declined   Copy of HF[place]in Chart? No - copy requested    Would patient like information on creating a medical advance directive?   No - Patient declined    Current Medications (verified) Outpatient Encounter Medications as of 07/14/2022  Medication Sig   ALPRAZolam (XANAX) 0.5 MG tablet Take 1 tablet (0.5 mg total) by mouth 2 (two) times daily as needed.   calcium-vitamin D (OSCAL WITH D) 250-125 MG-UNIT tablet Take 1 tablet by mouth daily.   citalopram (CELEXA) 40 MG tablet Take 1 tablet (40 mg total) by mouth daily.   estradiol (ESTRACE) 0.5 MG tablet Take 0.5 mg by mouth daily.   latanoprost (XALATAN) 0.005 % ophthalmic solution 1 drop at bedtime.   levothyroxine (SYNTHROID) 25 MCG tablet TAKE 1 TABLET BY MOUTH ONCE DAILY BEFORE BREAKFAST   brimonidine-timolol (COMBIGAN) 0.2-0.5 % ophthalmic solution Place 1 drop into both eyes every 12 (twelve) hours. (Patient not taking: Reported on 07/14/2022)   [facility] VITAMIN B12 1000 MCG tablet TAKE 1 TABLET BY MOUTH EVERY DAY (Patient not taking:  Reported on 07/14/2022)   cycloSPORINE (RESTASIS) 0.05 % ophthalmic emulsion Place 1 drop into both eyes 2 (two) times daily. (Patient not taking: Reported on 07/14/2022)   fluorometholone (FML) 0.1 % ophthalmic suspension 1 drop every 4 (four) hours. (Patient not taking: Reported on 07/14/2022)   Loteprednol Etabonate (EYSUVIS) 0.25 % SUSP Apply 1 drop to eye 2 (two) times daily. OU (Patient not taking: Reported on 07/14/2022)   timolol (TIMOPTIC) 0.5 % ophthalmic solution 1 drop 2 (two) times daily.   Travoprost, BAK Free, (TRAVATAN) 0.004 % SOLN ophthalmic solution Place 1 drop into both eyes at bedtime. (Patient not taking: Reported on 07/14/2022)   No facility-administered encounter medications on file as of 07/14/2022.    Allergies (verified) Sulfa antibiotics, Iodine, Other, Statins, Zetia [ezetimibe], Codeine, Penicillins, and Shingrix [zoster vac recomb adjuvanted]   History: Past Medical History:  Diagnosis Date   ALLERGIC RHINITIS 12/22/2007   Anxiety    ANXIETY DEPRESSION 08/30/2007   Arthritis    Left thumb bone on bone   BLURRED VISION, INTERMITTENT 12/22/2007   COLONIC POLYPS, HX OF 12/22/2007   01/18/2006   Glaucoma    NTG Glaucoma   GLUCOSE INTOLERANCE 07/08/2009   Headache(784.0) 12/22/2007   Herpes zoster of eye 05/10/2011   HYPERCHOLESTEROLEMIA 08/30/2007   HYPERLIPIDEMIA 03/26/2008   Hypothyroidism 07/24/2020   Impaired glucose tolerance 05/09/2011   MITRAL VALVE PROLAPSE, HX OF 08/30/2007   OSTEOPOROSIS 12/22/2007  POSTMENOPAUSAL STATUS 08/30/2007   SINUSITIS- ACUTE-NOS 02/06/2010   Past Surgical History:  Procedure Laterality Date   ABDOMINAL HYSTERECTOMY     AUGMENTATION MAMMAPLASTY     COLONOSCOPY     COSMETIC SURGERY     DILATION AND CURETTAGE OF UTERUS  2007   OOPHORECTOMY     POLYPECTOMY     Family History  Problem Relation Age of Onset   Heart disease Mother    Diabetes Mother    Hyperlipidemia Mother    Alcohol abuse Father    Breast cancer Sister    Cancer  Sister    Breast cancer Sister    Breast cancer Maternal Grandmother    Testicular cancer Son    Social History   Socioeconomic History   Marital status: Married    Spouse name: Not on file   Number of children: 2   Years of education: Not on file   Highest education level: Not on file  Occupational History   Occupation: Retired  Tobacco Use   Smoking status: Never   Smokeless tobacco: Never  Substance and Sexual Activity   Alcohol use: Yes    Alcohol/week: 2.0 standard drinks of alcohol    Types: 2 drink(s) per week    Comment: daily   Drug use: No   Sexual activity: Yes  Other Topics Concern   Not on file  Social History Narrative   Not on file   Social Determinants of Health   Financial Resource Strain: Low Risk  (07/14/2022)   Overall Financial Resource Strain (CARDIA)    Difficulty of Paying Living Expenses: Not hard at all  Food Insecurity: No Food Insecurity (07/14/2022)   Hunger Vital Sign    Worried About Running Out of Food in the Last Year: Never true    Ran Out of Food in the Last Year: Never true  Transportation Needs: No Transportation Needs (07/14/2022)   PRAPARE - Hydrologist (Medical): No    Lack of Transportation (Non-Medical): No  Physical Activity: Inactive (07/14/2022)   Exercise Vital Sign    Days of Exercise per Week: 0 days    Minutes of Exercise per Session: 0 min  Stress: No Stress Concern Present (07/14/2022)   [place]    Feeling of Stress : Not at all  Social Connections: [place] (07/14/2022)   Social Connection and Isolation Panel [NHANES]    Frequency of Communication with Friends and Family: More than three times a week    Frequency of Social Gatherings with Friends and Family: More than three times a week    Attends Religious Services: More than 4 times per year    Active Member of [employer] or Organizations: Yes    Attends Programme researcher, broadcasting/film/video: More than 4 times per year    Marital Status: Married    Tobacco Counseling Counseling given: Not Answered   Clinical Intake:  Pre-visit preparation completed: Yes  Pain : No/denies pain Pain Score: 0-No pain     BMI - recorded: 24.34 Nutritional Status: BMI of 19-24  Normal Nutritional Risks: None Diabetes: No  How often do you need to have someone help you when you read instructions, pamphlets, or other written materials from your doctor or pharmacy?: 1 - Never What is the last grade level you completed in school?: HSG; 1 year of college  Diabetic? no  Interpreter Needed?: No  Information entered by :: Lisette Abu, LPN.  Activities of Daily Living    07/14/2022   12:04 PM  In your present state of health, do you have any difficulty performing the following activities:  Hearing? 0  Vision? 0  Difficulty concentrating or making decisions? 0  Walking or climbing stairs? 0  Dressing or bathing? 0  Doing errands, shopping? 0  Preparing Food and eating ? N  Using the Toilet? N  In the past six months, have you accidently leaked urine? N  Do you have problems with loss of bowel control? N  Managing your Medications? N  Managing your Finances? N  Housekeeping or managing your Housekeeping? N    Patient Care Team: Biagio Borg, MD as PCP - General (Internal Medicine) Zadie Rhine Clent Demark, MD as Consulting Physician (Ophthalmology) Lisabeth Pick, MD as Consulting Physician (Ophthalmology)  Indicate any recent Medical Services you may have received from other than [facility] providers in the past year (date may be approximate).     Assessment:   This is a routine wellness examination for Arieonna.  Hearing/Vision screen Hearing Screening - Comments:: Denies hearing difficulties   Vision Screening - Comments:: Wears rx glasses - up to date with routine eye exams with [facility]   Dietary issues and exercise activities  discussed: Current Exercise Habits: The patient does not participate in regular exercise at present, Exercise limited by: Other - see comments (vision complications)   Goals Addressed             This Visit's Progress    MY GOAL IS TO LOSE WEIGHT.  MY IDEAL WEIGHT IS TO BE 125 POUNDS AND INCREASE MY PHYSICAL ACTIVITY.        Depression Screen    07/14/2022   11:57 AM 04/22/2022    1:01 PM 07/06/2021    4:32 PM 04/20/2021    2:55 PM 04/20/2021    2:36 PM 07/24/2020    1:53 PM 01/14/2020    1:50 PM  PHQ 2/9 Scores  PHQ - 2 Score '2 4 2 '$ 0 2 0 1  PHQ- 9 Score  13  0       Fall Risk    07/14/2022    9:41 AM 04/22/2022    1:00 PM 07/06/2021    4:36 PM 04/20/2021    2:36 PM 07/24/2020    1:53 PM  Fall Risk   Falls in the past year? 0 1 0 0 0  Number falls in past yr: 0 1 0 0   Injury with Fall? 0 0 0 0   Risk for fall due to : No Fall Risks  No Fall Risks    Follow up Falls prevention discussed  Falls evaluation completed      FALL RISK PREVENTION PERTAINING TO THE HOME:  Any stairs in or around the home? Yes  If so, are there any without handrails? No  Home free of loose throw rugs in walkways, pet beds, electrical cords, etc? Yes  Adequate lighting in your home to reduce risk of falls? Yes   ASSISTIVE DEVICES UTILIZED TO PREVENT FALLS:  Life alert? No  Use of a cane, walker or w/c? No  Grab bars in the bathroom? No  Shower chair or bench in shower? No  Elevated toilet seat or a handicapped toilet? No   TIMED UP AND GO:  Was the test performed? Yes .  Length of time to ambulate 10 feet: 6 sec.   Gait steady and fast without use of assistive device  Cognitive Function:  07/14/2022   12:04 PM  6CIT Screen  What Year? 0 points  What month? 0 points  What time? 0 points  Count back from 20 0 points  Months in reverse 0 points  Repeat phrase 0 points  Total Score 0 points    Immunizations Immunization History  Administered Date(s) Administered   Fluad  Quad(high Dose 65+) 07/02/2019, 07/24/2020, 10/20/2021   Influenza Inj Mdck Quad Pf 09/10/2017   Influenza, High Dose Seasonal PF 12/09/2014, 08/10/2018, 06/28/2019   Influenza,inj,Quad PF,6+ Mos 08/16/2014, 08/21/2015   Influenza-Unspecified 11/09/2015, 08/08/2017   PFIZER(Purple Top)SARS-COV-2 Vaccination 01/14/2020, 02/13/2020, 09/27/2020, 09/15/2021   Pneumococcal Conjugate-13 12/09/2014, 08/21/2015   Pneumococcal Polysaccharide-23 08/20/2014   Td 11/17/2009   Tdap 07/24/2020   Unspecified SARS-COV-2 Vaccination 01/14/2020, 02/13/2020   Zoster Recombinat (Shingrix) 06/08/2021   Zoster, Live 05/10/2011    TDAP status: Up to date  Flu Vaccine status: Due, Education has been provided regarding the importance of this vaccine. Advised may receive this vaccine at local pharmacy or Health Dept. Aware to provide a copy of the vaccination record if obtained from local pharmacy or Health Dept. Verbalized acceptance and understanding.  Pneumococcal vaccine status: Up to date  Covid-19 vaccine status: Completed vaccines  Qualifies for Shingles Vaccine? Yes   Zostavax completed Yes   Shingrix Completed?: Yes  Screening Tests Health Maintenance  Topic Date Due   COVID-19 Vaccine (7 - Mixed Product series) 01/13/2022   INFLUENZA VACCINE  06/08/2022   COLONOSCOPY (Pts 45-53yr Insurance coverage will need to be confirmed)  05/19/2023   MAMMOGRAM  07/16/2023   TETANUS/TDAP  07/24/2030   Pneumonia Vaccine 74 Years old  Completed   DEXA SCAN  Completed   Hepatitis C Screening  Completed   HPV VACCINES  Aged Out   Zoster Vaccines- Shingrix  Discontinued    Health Maintenance  Health Maintenance Due  Topic Date Due   COVID-19 Vaccine (7 - Mixed Product series) 01/13/2022   INFLUENZA VACCINE  06/08/2022    Colorectal cancer screening: Type of screening: Colonoscopy. Completed 05/18/2013. Repeat every 10 years  Mammogram status: Completed 07/15/2021. Repeat every year (scheduled for  08/02/2022)  Bone Density status: Ordered 07/14/2022. Pt provided with contact info and advised to call to schedule appt.  Lung Cancer Screening: (Low Dose CT Chest recommended if Age 858-80years, 30 pack-year currently smoking OR have quit w/in 15years.) does not qualify.   Lung Cancer Screening Referral: no  Additional Screening:  Hepatitis C Screening: does qualify; Completed 11/29/2017  Vision Screening: Recommended annual ophthalmology exams for early detection of glaucoma and other disorders of the eye. Is the patient up to date with their annual eye exam?  Yes  Who is the provider or what is the name of the office in which the patient attends annual eye exams? GDeloria Lair MD and CArnoldo Hooker MD If pt is not established with a provider, would they like to be referred to a provider to establish care? No .   Dental Screening: Recommended annual dental exams for proper oral hygiene  Community Resource Referral / Chronic Care Management: CRR required this visit?  No   CCM required this visit?  No      Plan:     I have personally reviewed and noted the following in the patient's chart:   Medical and social history Use of alcohol, tobacco or illicit drugs  Current medications and supplements including opioid prescriptions. Patient is not currently taking opioid prescriptions. Functional ability and status Nutritional status  Physical activity Advanced directives List of other physicians Hospitalizations, surgeries, and ER visits in previous 12 months Vitals Screenings to include cognitive, depression, and falls Referrals and appointments  In addition, I have reviewed and discussed with patient certain preventive protocols, quality metrics, and best practice recommendations. A written personalized care plan for preventive services as well as general preventive health recommendations were provided to patient.     Sheral Flow, LPN   [id number]/04/8933   Nurse Notes:  Hearing  Screening - Comments:: Denies hearing difficulties   Vision Screening - Comments:: Wears rx glasses - up to date with routine eye exams with [facility]

## 2022-07-16 ENCOUNTER — Ambulatory Visit: Payer: Medicare HMO

## 2022-07-16 DIAGNOSIS — Z79899 Other long term (current) drug therapy: Secondary | ICD-10-CM | POA: Diagnosis not present

## 2022-07-16 DIAGNOSIS — H401233 Low-tension glaucoma, bilateral, severe stage: Secondary | ICD-10-CM | POA: Diagnosis not present

## 2022-08-02 ENCOUNTER — Ambulatory Visit: Payer: Medicare HMO

## 2022-08-12 DIAGNOSIS — L820 Inflamed seborrheic keratosis: Secondary | ICD-10-CM | POA: Diagnosis not present

## 2022-08-12 DIAGNOSIS — L570 Actinic keratosis: Secondary | ICD-10-CM | POA: Diagnosis not present

## 2022-08-12 DIAGNOSIS — L218 Other seborrheic dermatitis: Secondary | ICD-10-CM | POA: Diagnosis not present

## 2022-08-30 ENCOUNTER — Ambulatory Visit
Admission: RE | Admit: 2022-08-30 | Discharge: 2022-08-30 | Disposition: A | Discharge status: Home / Self Care | Payer: Medicare HMO | Source: Ambulatory Visit | Attending: Obstetrics and Gynecology | Admitting: Obstetrics and Gynecology

## 2022-08-30 DIAGNOSIS — Z1231 Encounter for screening mammogram for malignant neoplasm of breast: Secondary | ICD-10-CM | POA: Diagnosis not present

## 2022-09-01 DIAGNOSIS — Z8249 Family history of ischemic heart disease and other diseases of the circulatory system: Secondary | ICD-10-CM | POA: Diagnosis not present

## 2022-09-01 DIAGNOSIS — R69 Illness, unspecified: Secondary | ICD-10-CM | POA: Diagnosis not present

## 2022-09-01 DIAGNOSIS — M1990 Unspecified osteoarthritis, unspecified site: Secondary | ICD-10-CM | POA: Diagnosis not present

## 2022-09-01 DIAGNOSIS — L900 Lichen sclerosus et atrophicus: Secondary | ICD-10-CM | POA: Diagnosis not present

## 2022-09-01 DIAGNOSIS — H401233 Low-tension glaucoma, bilateral, severe stage: Secondary | ICD-10-CM | POA: Diagnosis not present

## 2022-09-01 DIAGNOSIS — Z79890 Hormone replacement therapy (postmenopausal): Secondary | ICD-10-CM | POA: Diagnosis not present

## 2022-09-01 DIAGNOSIS — I739 Peripheral vascular disease, unspecified: Secondary | ICD-10-CM | POA: Diagnosis not present

## 2022-09-01 DIAGNOSIS — I10 Essential (primary) hypertension: Secondary | ICD-10-CM | POA: Diagnosis not present

## 2022-09-01 DIAGNOSIS — Z83438 Family history of other disorder of lipoprotein metabolism and other lipidemia: Secondary | ICD-10-CM | POA: Diagnosis not present

## 2022-09-01 DIAGNOSIS — E039 Hypothyroidism, unspecified: Secondary | ICD-10-CM | POA: Diagnosis not present

## 2022-09-01 DIAGNOSIS — H409 Unspecified glaucoma: Secondary | ICD-10-CM | POA: Diagnosis not present

## 2022-09-01 DIAGNOSIS — Z791 Long term (current) use of non-steroidal anti-inflammatories (NSAID): Secondary | ICD-10-CM | POA: Diagnosis not present

## 2022-09-01 DIAGNOSIS — Z008 Encounter for other general examination: Secondary | ICD-10-CM | POA: Diagnosis not present

## 2022-09-25 ENCOUNTER — Emergency Department (HOSPITAL_BASED_OUTPATIENT_CLINIC_OR_DEPARTMENT_OTHER)
Admission: EM | Admit: 2022-09-25 | Discharge: 2022-09-25 | Disposition: A | Discharge status: Home / Self Care | Payer: Medicare HMO | Attending: Emergency Medicine | Admitting: Emergency Medicine

## 2022-09-25 ENCOUNTER — Encounter (HOSPITAL_BASED_OUTPATIENT_CLINIC_OR_DEPARTMENT_OTHER): Payer: Self-pay | Admitting: Emergency Medicine

## 2022-09-25 ENCOUNTER — Other Ambulatory Visit: Payer: Self-pay

## 2022-09-25 ENCOUNTER — Emergency Department (HOSPITAL_BASED_OUTPATIENT_CLINIC_OR_DEPARTMENT_OTHER): Payer: Medicare HMO | Admitting: Radiology

## 2022-09-25 DIAGNOSIS — R059 Cough, unspecified: Secondary | ICD-10-CM | POA: Diagnosis not present

## 2022-09-25 DIAGNOSIS — J209 Acute bronchitis, unspecified: Secondary | ICD-10-CM | POA: Diagnosis not present

## 2022-09-25 DIAGNOSIS — Z20822 Contact with and (suspected) exposure to covid-19: Secondary | ICD-10-CM | POA: Insufficient documentation

## 2022-09-25 DIAGNOSIS — R5383 Other fatigue: Secondary | ICD-10-CM | POA: Diagnosis not present

## 2022-09-25 DIAGNOSIS — M7918 Myalgia, other site: Secondary | ICD-10-CM | POA: Diagnosis not present

## 2022-09-25 DIAGNOSIS — R062 Wheezing: Secondary | ICD-10-CM | POA: Diagnosis not present

## 2022-09-25 LAB — RESP PANEL BY RT-PCR (FLU A&B, COVID) ARPGX2
Influenza A by PCR: NEGATIVE
Influenza B by PCR: NEGATIVE
SARS Coronavirus 2 by RT PCR: NEGATIVE

## 2022-09-25 MED ORDER — IPRATROPIUM-ALBUTEROL 0.5-2.5 (3) MG/3ML IN SOLN
3.0000 mL | Freq: Once | RESPIRATORY_TRACT | Status: AC
  Administered 2022-09-25: 3 mL via RESPIRATORY_TRACT
  Filled 2022-09-25: qty 3

## 2022-09-25 MED ORDER — ALBUTEROL SULFATE HFA 108 (90 BASE) MCG/ACT IN AERS: 1.0000 | INHALATION_SPRAY | Freq: Four times a day (QID) | RESPIRATORY_TRACT | 0 refills | Status: DC | PRN

## 2022-09-25 MED ORDER — BENZONATATE 100 MG PO CAPS: 100.0000 mg | ORAL_CAPSULE | Freq: Three times a day (TID) | ORAL | 0 refills | Status: DC

## 2022-09-25 NOTE — Discharge Instructions (Addendum)
Thank you for allowing me to be part of your care today.  You were evaluated in the ER for cough and wheezing.  I believe you have acute viral bronchitis which would explain your symptoms.  We gave you a breathing treatment while you were here, I am sending you home on an albuterol inhaler to help with wheezing.  I recommend using guaifenesin during the day to help thin mucus, it is important for you to continue to drink lots of water while taking this medication.  I am prescribing you Tessalon Perles for use at night to help with irritating cough.  You may also take spoonfuls of honey to help soothe your throat and reduce throat irritation.  I recommend using Tylenol or ibuprofen for body aches/rib cage pain.   I recommend following up with your PCP next week if you continue to have persistent symptoms.  Return to the ER if you develop new or worsening symptoms.

## 2022-09-25 NOTE — ED Provider Notes (Signed)
[facility] EMERGENCY DEPT Provider Note   CSN: [id number] Arrival date & time: 09/25/22  1040     History  Chief Complaint  Patient presents with   URI    Cindy Higgins is a 76 y.o. female presents to the ER with complaint of fatigue, body aches, cough and wheezing that began on Wednesday.  She also reports some congestion and rhinorrhea.  Denies fever, endorses mild chills.  She states her chest wall is very sore from coughing and she feels like she cannot cough up the mucus.  She was concerned because of the wheezing noise she heard whenever breathing out.  Denies chest pain, shortness of breath, chest tightness.      Home Medications Prior to Admission medications   Medication Sig Start Date End Date Taking? Authorizing Provider  albuterol (VENTOLIN HFA) 108 (90 Base) MCG/ACT inhaler Inhale 1-2 puffs into the lungs every 6 (six) hours as needed for wheezing or shortness of breath. 09/25/22  Yes Makenze Ellett R, PA  benzonatate (TESSALON) 100 MG capsule Take 1 capsule (100 mg total) by mouth every 8 (eight) hours. 09/25/22  Yes Mekai Wilkinson R, PA  ALPRAZolam (XANAX) 0.5 MG tablet Take 1 tablet (0.5 mg total) by mouth 2 (two) times daily as needed. 04/22/22   Biagio Borg, MD  brimonidine-timolol (COMBIGAN) 0.2-0.5 % ophthalmic solution Place 1 drop into both eyes every 12 (twelve) hours. Patient not taking: Reported on 07/14/2022    [provider]  calcium-vitamin D (OSCAL WITH D) 250-125 MG-UNIT tablet Take 1 tablet by mouth daily.    [provider]  citalopram (CELEXA) 40 MG tablet Take 1 tablet (40 mg total) by mouth daily. 04/22/22   Biagio Borg, MD  [facility] VITAMIN B12 1000 MCG tablet TAKE 1 TABLET BY MOUTH EVERY DAY Patient not taking: Reported on 07/14/2022 06/23/22   Biagio Borg, MD  cycloSPORINE (RESTASIS) 0.05 % ophthalmic emulsion Place 1 drop into both eyes 2 (two) times daily. Patient not taking: Reported on 07/14/2022 07/24/20   Biagio Borg, MD  estradiol (ESTRACE) 0.5 MG tablet Take 0.5 mg by mouth daily. 11/23/16   [provider]  fluorometholone (FML) 0.1 % ophthalmic suspension 1 drop every 4 (four) hours. Patient not taking: Reported on 07/14/2022    [provider]  latanoprost (XALATAN) 0.005 % ophthalmic solution 1 drop at bedtime.    [provider]  levothyroxine (SYNTHROID) 25 MCG tablet TAKE 1 TABLET BY MOUTH ONCE DAILY BEFORE BREAKFAST 05/07/22   Biagio Borg, MD  Loteprednol Etabonate (EYSUVIS) 0.25 % SUSP Apply 1 drop to eye 2 (two) times daily. OU Patient not taking: Reported on 07/14/2022    [provider]  timolol (TIMOPTIC) 0.5 % ophthalmic solution 1 drop 2 (two) times daily. 05/12/22   [provider]  Travoprost, BAK Free, (TRAVATAN) 0.004 % SOLN ophthalmic solution Place 1 drop into both eyes at bedtime. Patient not taking: Reported on 07/14/2022 07/24/20   Biagio Borg, MD      Allergies    Sulfa antibiotics, Iodine, Other, Statins, Zetia [ezetimibe], Codeine, Penicillins, and Shingrix [zoster vac recomb adjuvanted]    Review of Systems   Review of Systems  Constitutional:  Positive for chills. Negative for fever.  HENT:  Positive for congestion and rhinorrhea.   Respiratory:  Positive for cough and wheezing. Negative for chest tightness and shortness of breath.   Cardiovascular:  Negative for chest pain.    Physical Exam Updated  Vital Signs BP (!) 140/77 (BP Location: Right Arm)   Pulse 88   Temp 98.2 F (36.8 C)   Resp 17   SpO2 100%  Physical Exam Vitals and nursing note reviewed.  Constitutional:      General: She is not in acute distress.    Appearance: Normal appearance. She is not ill-appearing or diaphoretic.  HENT:     Nose: Congestion and rhinorrhea present.     Mouth/Throat:     Mouth: Mucous membranes are moist.     Pharynx: Oropharynx is clear. No oropharyngeal exudate or posterior oropharyngeal erythema.  Eyes:      Conjunctiva/sclera: Conjunctivae normal.  Cardiovascular:     Rate and Rhythm: Normal rate and regular rhythm.     Pulses: Normal pulses.  Pulmonary:     Effort: Pulmonary effort is normal. No respiratory distress.     Breath sounds: Normal air entry. Wheezing present. No decreased breath sounds or rhonchi.     Comments: Expiratory wheezing auscultated in all lung fields. Abdominal:     General: Abdomen is flat.     Palpations: Abdomen is soft.     Tenderness: There is no abdominal tenderness.  Skin:    General: Skin is warm and dry.     Capillary Refill: Capillary refill takes less than 2 seconds.  Neurological:     Mental Status: She is alert. Mental status is at baseline.  Psychiatric:        Mood and Affect: Mood normal.        Behavior: Behavior normal.     ED Results / Procedures / Treatments   Labs (all labs ordered are listed, but only abnormal results are displayed) Labs Reviewed  RESP PANEL BY RT-PCR (FLU A&B, COVID) [id number]    EKG None  Radiology DG Chest 2 View  Result Date: 09/25/2022 CLINICAL DATA:  Cough and wheezing. EXAM: CHEST - 2 VIEW COMPARISON:  04/22/2022 and prior studies FINDINGS: The cardiomediastinal silhouette is unremarkable. There is no evidence of focal airspace disease, pulmonary edema, suspicious pulmonary nodule/mass, pleural effusion, or pneumothorax. No acute bony abnormalities are identified. IMPRESSION: No active cardiopulmonary disease. Electronically Signed   By: Margarette Canada M.D.   On: 09/25/2022 12:22    Procedures Procedures    Medications Ordered in ED Medications  ipratropium-albuterol (DUONEB) 0.5-2.5 (3) MG/3ML nebulizer solution 3 mL (3 mLs Nebulization Given 09/25/22 1449)    ED Course/ Medical Decision Making/ A&P                           Medical Decision Making Amount and/or Complexity of Data Reviewed Radiology: ordered.  Risk Prescription drug management.   This patient presents to the ED with chief  complaint(s) of cough, congestion, and wheezing since Wednesday.  She has had no improvement in her symptoms despite supportive care measures at home.  The differential diagnosis includes viral URI, viral bronchitis, pneumonia  The initial plan is to obtain COVID and flu swabs, obtain chest x-ray  Initial Assessment:   On exam, patient is resting comfortably in bed, is in no acute distress.  She is ill-appearing, but nontoxic.  Expiratory wheezing appreciated in all lung fields.  She has a productive cough.  She is not tachypneic.  Heart rate and rhythm are normal, normal heart sounds.  Skin is warm and dry.  She has congestion with clear rhinorrhea.  Pharynx is moist and clear.  Independent ECG/labs interpretation: COVID and influenza are  both negative.  I do not feel that additional laboratory work-up is warranted at this time.  Independent visualization and interpretation of imaging: I independently visualized the following imaging with scope of interpretation limited to determining acute life threatening conditions related to emergency care: Chest x-ray, which revealed no evidence of infiltrate, pneumonia, pneumothorax, pleural effusion.  Heart is normal size.  I agree with radiologist interpretation.  Treatment and Reassessment: We will treat patient with DuoNeb while in ED and reassess lung sounds. Mild expiratory wheezing still present, much improved, following treatment.  Patient reports feeling better.  Productive cough still present.   Disposition:   Based on patient presentation and physical exam, I believe patient has acute bronchitis likely secondary to a viral process.  She is COVID and flu negative.  She did improve following a breathing treatment, we will send her home with an inhaler and Tessalon Perles to manage wheezing and cough.  Discussed supportive care measures at home with patient.  Recommended follow-up with primary care provider next week to ensure she is improving.   Patient is agreeable to plan of discharge.  Return precautions were given.          Final Clinical Impression(s) / ED Diagnoses Final diagnoses:  Acute bronchitis, unspecified organism    Rx / DC Orders ED Discharge Orders          Ordered    albuterol (VENTOLIN HFA) 108 (90 Base) MCG/ACT inhaler  Every 6 hours PRN        09/25/22 1507    benzonatate (TESSALON) 100 MG capsule  Every 8 hours        09/25/22 1507              Pat Kocher, [place] 09/25/22 1508    Lajean Saver, MD 09/25/22 1552

## 2022-09-25 NOTE — ED Triage Notes (Signed)
Wednesday started with coughing and runny nose, has progressed to congested cough and wheezing.

## 2022-09-28 ENCOUNTER — Telehealth: Payer: Self-pay

## 2022-09-28 NOTE — Telephone Encounter (Signed)
        Patient  visited [facility] on 11/18    Telephone encounter attempt :  1st  A HIPAA compliant voice message was left requesting a return call.  Instructed patient to call back    [facility], [facility]  [phone] [address], [place], [place] [address] Phone: [phone] Email: [email or web address]

## 2022-09-29 ENCOUNTER — Telehealth: Payer: Self-pay

## 2022-09-29 NOTE — Telephone Encounter (Signed)
     Patient  visit on 11/18  at [facility]   Have you been able to follow up with your primary care physician? Yes   The patient was or was not able to obtain any needed medicine or equipment. Yes   Are there diet recommendations that you are having difficulty following? Na   Patient expresses understanding of discharge instructions and education provided has no other needs at this time.   Yes     Cindy Higgins [facility], [facility], Care Management  [phone] [address], [place], [place] [address] Phone: [phone] Email: [email or web address]    

## 2022-10-25 ENCOUNTER — Ambulatory Visit (INDEPENDENT_AMBULATORY_CARE_PROVIDER_SITE_OTHER)
Admission: RE | Admit: 2022-10-25 | Discharge: 2022-10-25 | Disposition: A | Discharge status: Home / Self Care | Payer: Medicare HMO | Source: Ambulatory Visit | Attending: Internal Medicine | Admitting: Internal Medicine

## 2022-10-25 ENCOUNTER — Ambulatory Visit (INDEPENDENT_AMBULATORY_CARE_PROVIDER_SITE_OTHER): Payer: Medicare HMO | Admitting: Internal Medicine

## 2022-10-25 VITALS — BP 114/68 | HR 91 | Temp 98.3°F | Ht 64.0 in | Wt 142.0 lb

## 2022-10-25 DIAGNOSIS — Z13820 Encounter for screening for osteoporosis: Secondary | ICD-10-CM | POA: Diagnosis not present

## 2022-10-25 DIAGNOSIS — R7302 Impaired glucose tolerance (oral): Secondary | ICD-10-CM

## 2022-10-25 DIAGNOSIS — E7800 Pure hypercholesterolemia, unspecified: Secondary | ICD-10-CM | POA: Diagnosis not present

## 2022-10-25 DIAGNOSIS — E538 Deficiency of other specified B group vitamins: Secondary | ICD-10-CM

## 2022-10-25 DIAGNOSIS — I10 Essential (primary) hypertension: Secondary | ICD-10-CM | POA: Diagnosis not present

## 2022-10-25 DIAGNOSIS — E559 Vitamin D deficiency, unspecified: Secondary | ICD-10-CM | POA: Diagnosis not present

## 2022-10-25 NOTE — Assessment & Plan Note (Signed)
Lab Results  Component Value Date   LDLCALC 152 (H) 04/16/2022   Uncontrolled, for lower chol diet,  pt declines statin after recent Card Ct score 0

## 2022-10-25 NOTE — Assessment & Plan Note (Signed)
Lab Results  Component Value Date   HGBA1C 6.0 04/16/2022   Stable, pt to continue current medical treatment  - diet, wt control, excercise

## 2022-10-25 NOTE — Assessment & Plan Note (Signed)
BP Readings from Last 3 Encounters:  10/25/22 114/68  09/25/22 (!) 140/77  07/14/22 120/60   Stable, pt to continue medical treatment  - diet, wt control, excercise

## 2022-10-25 NOTE — Assessment & Plan Note (Signed)
Lab Results  Component Value Date   VITAMINB12 1,366 (H) 04/16/2022   Overcontrolled, cont oral replacement - b12 1000 mcg at QOD only

## 2022-10-25 NOTE — Assessment & Plan Note (Signed)
Last vitamin D Lab Results  Component Value Date   VD25OH 71.53 04/16/2022   Stable, cont oral replacement

## 2022-10-25 NOTE — Patient Instructions (Signed)
Please schedule the bone density test before leaving today at the scheduling desk (where you check out)  Ok to decrease the B12 to every other day  Please continue all other medications as before  Please have the pharmacy call with any other refills you may need.  Please continue your efforts at being more active, low cholesterol diet, and weight control.  Please keep your appointments with your specialists as you may have planned  Please make an Appointment to return in 6 months, or sooner if needed, also with Lab Appointment for testing done 3-5 days before at the [facility] (so this is for TWO appointments - please see the scheduling desk as you leave)

## 2022-10-25 NOTE — Progress Notes (Signed)
Patient ID: Cindy Higgins, female   DOB: [birth year], 76 y.o.   MRN: [id number]        Chief Complaint: follow up HTN, HLD and hyperglycemia, low b12       HPI:  Cindy Higgins is a 76 y.o. female here overall doing ok, Pt denies chest pain, increased sob or doe, wheezing, orthopnea, PND, increased LE swelling, palpitations, dizziness or syncope.   Pt denies polydipsia, polyuria, or new focal neuro s/s.    Pt denies fever, wt loss, night sweats, loss of appetite, or other constitutional symptoms  Has appt for derm soon for whole body exam, also conts to see Optho Dr Cindy Higgins and controlled glaucoma.  Recent Card CT score 0, delcines statin for HLD.  Taking B12 daily with recent elevated level.  Due for DXA Wt Readings from Last 3 Encounters:  10/25/22 142 lb (64.4 kg)  07/14/22 141 lb 12.8 oz (64.3 kg)  04/22/22 138 lb (62.6 kg)   BP Readings from Last 3 Encounters:  10/25/22 114/68  09/25/22 (!) 140/77  07/14/22 120/60         Past Medical History:  Diagnosis Date   ALLERGIC RHINITIS 12/22/2007   Anxiety    ANXIETY DEPRESSION 08/30/2007   Arthritis    Left thumb bone on bone   BLURRED VISION, INTERMITTENT 12/22/2007   COLONIC POLYPS, HX OF 12/22/2007   01/18/2006   Glaucoma    NTG Glaucoma   GLUCOSE INTOLERANCE 07/08/2009   Headache(784.0) 12/22/2007   Herpes zoster of eye 05/10/2011   HYPERCHOLESTEROLEMIA 08/30/2007   HYPERLIPIDEMIA 03/26/2008   Hypothyroidism 07/24/2020   Impaired glucose tolerance 05/09/2011   MITRAL VALVE PROLAPSE, HX OF 08/30/2007   OSTEOPOROSIS 12/22/2007   POSTMENOPAUSAL STATUS 08/30/2007   SINUSITIS- ACUTE-NOS 02/06/2010   Past Surgical History:  Procedure Laterality Date   ABDOMINAL HYSTERECTOMY     AUGMENTATION MAMMAPLASTY     COLONOSCOPY     COSMETIC SURGERY     DILATION AND CURETTAGE OF UTERUS  2007   OOPHORECTOMY     POLYPECTOMY      reports that she has never smoked. She has never used smokeless tobacco. She reports current alcohol use of about 2.0  standard drinks of alcohol per week. She reports that she does not use drugs. family history includes Alcohol abuse in her father; Breast cancer in her maternal grandmother, sister, and sister; Cancer in her sister; Diabetes in her mother; Heart disease in her mother; Hyperlipidemia in her mother; Testicular cancer in her son. Allergies  Allergen Reactions   Sulfa Antibiotics Anaphylaxis   Iodine Other (See Comments)    Burning   Other Other (See Comments)   Statins Other (See Comments)   Zetia [Ezetimibe] Diarrhea   Codeine Itching and Rash   Penicillins Itching    Has patient had a PCN reaction causing immediate rash, facial/tongue/throat swelling, SOB or lightheadedness with hypotension:  Unknown think it was rash Has patient had a PCN reaction causing severe rash involving mucus membranes or skin necrosis: unknown childhood Has patient had a PCN reaction that required hospitalization: unknown Has patient had a PCN reaction occurring within the last 10 years: no childhood If all of the above answers are "NO", then may proceed with Cephalosporin use.     Shingrix [Zoster Vac Recomb Adjuvanted] Rash    Severe arm erythema and heat   Current Outpatient Medications on File Prior to Visit  Medication Sig Dispense Refill   ALPRAZolam (XANAX) 0.5 MG tablet Take 1  tablet (0.5 mg total) by mouth 2 (two) times daily as needed. 60 tablet 2   benzonatate (TESSALON) 100 MG capsule Take 1 capsule (100 mg total) by mouth every 8 (eight) hours. 21 capsule 0   calcium-vitamin D (OSCAL WITH D) 250-125 MG-UNIT tablet Take 1 tablet by mouth daily.     citalopram (CELEXA) 40 MG tablet Take 1 tablet (40 mg total) by mouth daily. 90 tablet 3   estradiol (ESTRACE) 0.5 MG tablet Take 0.5 mg by mouth daily.     levothyroxine (SYNTHROID) 25 MCG tablet TAKE 1 TABLET BY MOUTH ONCE DAILY BEFORE BREAKFAST 90 tablet 3   timolol (TIMOPTIC) 0.5 % ophthalmic solution 1 drop 2 (two) times daily.     clobetasol ointment  (TEMOVATE) 0.05 % USE SMALL AMOUNT TO AFFECTED AREA TWICE A DAY FOR 2-3 WEEKS     No current facility-administered medications on file prior to visit.        ROS:  All others reviewed and negative.  Objective        PE:  BP 114/68 (BP Location: Right Arm, Patient Position: Standing, Cuff Size: Large)   Pulse 91   Temp 98.3 F (36.8 C) (Oral)   Ht '5\' 4"'$  (1.626 m)   Wt 142 lb (64.4 kg)   SpO2 97%   BMI 24.37 kg/m                 Constitutional: Pt appears in NAD               HENT: Head: NCAT.                Right Ear: External ear normal.                 Left Ear: External ear normal.                Eyes: . Pupils are equal, round, and reactive to light. Conjunctivae and EOM are normal               Nose: without d/c or deformity               Neck: Neck supple. Gross normal ROM               Cardiovascular: Normal rate and regular rhythm.                 Pulmonary/Chest: Effort normal and breath sounds without rales or wheezing.                Abd:  Soft, NT, ND, + BS, no organomegaly               Neurological: Pt is alert. At baseline orientation, motor grossly intact               Skin: Skin is warm. No rashes, no other new lesions, LE edema - none               Psychiatric: Pt behavior is normal without agitation   Micro: none  Cardiac tracings I have personally interpreted today:  none  Pertinent Radiological findings (summarize): none   Lab Results  Component Value Date   WBC 5.3 04/16/2022   HGB 13.9 04/16/2022   HCT 40.8 04/16/2022   PLT 305.0 04/16/2022   GLUCOSE 100 (H) 04/16/2022   CHOL 257 (H) 04/16/2022   TRIG 170.0 (H) 04/16/2022   HDL 70.90 04/16/2022   LDLDIRECT 190.0 04/20/2021   LDLCALC 152 (  H) 04/16/2022   ALT 23 04/16/2022   AST 22 04/16/2022   NA 136 04/16/2022   K 4.8 04/16/2022   CL 101 04/16/2022   CREATININE 0.68 04/16/2022   BUN 10 04/16/2022   CO2 29 04/16/2022   TSH 3.74 04/16/2022   HGBA1C 6.0 04/16/2022   Assessment/Plan:   Cindy Higgins is a 76 y.o. White or Caucasian [1] female with  has a past medical history of ALLERGIC RHINITIS (12/22/2007), Anxiety, ANXIETY DEPRESSION (08/30/2007), Arthritis, BLURRED VISION, INTERMITTENT (12/22/2007), COLONIC POLYPS, HX OF (12/22/2007), Glaucoma, GLUCOSE INTOLERANCE (07/08/2009), Headache(784.0) (12/22/2007), Herpes zoster of eye (05/10/2011), HYPERCHOLESTEROLEMIA (08/30/2007), HYPERLIPIDEMIA (03/26/2008), Hypothyroidism (07/24/2020), Impaired glucose tolerance (05/09/2011), MITRAL VALVE PROLAPSE, HX OF (08/30/2007), OSTEOPOROSIS (12/22/2007), POSTMENOPAUSAL STATUS (08/30/2007), and SINUSITIS- ACUTE-NOS (02/06/2010).  Vitamin d deficiency Last vitamin D Lab Results  Component Value Date   VD25OH 71.53 04/16/2022   Stable, cont oral replacement   B12 deficiency Lab Results  Component Value Date   VITAMINB12 1,366 (H) 04/16/2022   Overcontrolled, cont oral replacement - b12 1000 mcg at QOD only   HYPERCHOLESTEROLEMIA Lab Results  Component Value Date   LDLCALC 152 (H) 04/16/2022   Uncontrolled, for lower chol diet,  pt declines statin after recent Card Ct score 0   Hypertension, uncontrolled BP Readings from Last 3 Encounters:  10/25/22 114/68  09/25/22 (!) 140/77  07/14/22 120/60   Stable, pt to continue medical treatment  - diet, wt control, excercise   Impaired glucose tolerance Lab Results  Component Value Date   HGBA1C 6.0 04/16/2022   Stable, pt to continue current medical treatment  - diet, wt control, excercise  Followup: No follow-ups on file.  Cathlean Cower, MD 10/25/2022 1:48 PM [facility]

## 2022-11-10 ENCOUNTER — Other Ambulatory Visit: Payer: Self-pay | Admitting: Internal Medicine

## 2022-11-17 DIAGNOSIS — Z961 Presence of intraocular lens: Secondary | ICD-10-CM | POA: Insufficient documentation

## 2022-11-17 DIAGNOSIS — H401233 Low-tension glaucoma, bilateral, severe stage: Secondary | ICD-10-CM | POA: Insufficient documentation

## 2022-11-18 DIAGNOSIS — H35371 Puckering of macula, right eye: Secondary | ICD-10-CM | POA: Diagnosis not present

## 2022-11-18 DIAGNOSIS — H401233 Low-tension glaucoma, bilateral, severe stage: Secondary | ICD-10-CM | POA: Diagnosis not present

## 2022-11-18 DIAGNOSIS — Z961 Presence of intraocular lens: Secondary | ICD-10-CM | POA: Diagnosis not present

## 2022-12-01 DIAGNOSIS — H401233 Low-tension glaucoma, bilateral, severe stage: Secondary | ICD-10-CM | POA: Diagnosis not present

## 2022-12-07 DIAGNOSIS — L570 Actinic keratosis: Secondary | ICD-10-CM | POA: Diagnosis not present

## 2022-12-28 ENCOUNTER — Encounter (INDEPENDENT_AMBULATORY_CARE_PROVIDER_SITE_OTHER): Payer: Medicare HMO | Admitting: Ophthalmology

## 2023-01-27 DIAGNOSIS — H1589 Other disorders of sclera: Secondary | ICD-10-CM | POA: Diagnosis not present

## 2023-01-27 DIAGNOSIS — H04129 Dry eye syndrome of unspecified lacrimal gland: Secondary | ICD-10-CM | POA: Diagnosis not present

## 2023-01-27 DIAGNOSIS — H401233 Low-tension glaucoma, bilateral, severe stage: Secondary | ICD-10-CM | POA: Diagnosis not present

## 2023-01-27 DIAGNOSIS — H0288B Meibomian gland dysfunction left eye, upper and lower eyelids: Secondary | ICD-10-CM | POA: Diagnosis not present

## 2023-01-27 DIAGNOSIS — H0288A Meibomian gland dysfunction right eye, upper and lower eyelids: Secondary | ICD-10-CM | POA: Diagnosis not present

## 2023-02-11 DIAGNOSIS — L918 Other hypertrophic disorders of the skin: Secondary | ICD-10-CM | POA: Diagnosis not present

## 2023-02-11 DIAGNOSIS — L821 Other seborrheic keratosis: Secondary | ICD-10-CM | POA: Diagnosis not present

## 2023-02-11 DIAGNOSIS — L570 Actinic keratosis: Secondary | ICD-10-CM | POA: Diagnosis not present

## 2023-02-11 DIAGNOSIS — D692 Other nonthrombocytopenic purpura: Secondary | ICD-10-CM | POA: Diagnosis not present

## 2023-02-11 DIAGNOSIS — D1801 Hemangioma of skin and subcutaneous tissue: Secondary | ICD-10-CM | POA: Diagnosis not present

## 2023-02-11 DIAGNOSIS — L820 Inflamed seborrheic keratosis: Secondary | ICD-10-CM | POA: Diagnosis not present

## 2023-02-11 DIAGNOSIS — L718 Other rosacea: Secondary | ICD-10-CM | POA: Diagnosis not present

## 2023-03-02 DIAGNOSIS — H401233 Low-tension glaucoma, bilateral, severe stage: Secondary | ICD-10-CM | POA: Diagnosis not present

## 2023-04-18 DIAGNOSIS — H401211 Low-tension glaucoma, right eye, mild stage: Secondary | ICD-10-CM | POA: Diagnosis not present

## 2023-04-18 DIAGNOSIS — Z961 Presence of intraocular lens: Secondary | ICD-10-CM | POA: Diagnosis not present

## 2023-04-18 DIAGNOSIS — H35371 Puckering of macula, right eye: Secondary | ICD-10-CM | POA: Diagnosis not present

## 2023-04-18 DIAGNOSIS — H401222 Low-tension glaucoma, left eye, moderate stage: Secondary | ICD-10-CM | POA: Diagnosis not present

## 2023-04-18 DIAGNOSIS — H26492 Other secondary cataract, left eye: Secondary | ICD-10-CM | POA: Diagnosis not present

## 2023-04-20 DIAGNOSIS — L900 Lichen sclerosus et atrophicus: Secondary | ICD-10-CM | POA: Diagnosis not present

## 2023-04-27 ENCOUNTER — Other Ambulatory Visit: Payer: Self-pay | Admitting: Internal Medicine

## 2023-04-27 ENCOUNTER — Encounter: Payer: Self-pay | Admitting: Internal Medicine

## 2023-04-27 ENCOUNTER — Other Ambulatory Visit (INDEPENDENT_AMBULATORY_CARE_PROVIDER_SITE_OTHER): Payer: Medicare HMO

## 2023-04-27 ENCOUNTER — Ambulatory Visit (INDEPENDENT_AMBULATORY_CARE_PROVIDER_SITE_OTHER): Payer: Medicare HMO | Admitting: Internal Medicine

## 2023-04-27 VITALS — BP 124/72 | HR 94 | Temp 98.4°F | Ht 64.0 in | Wt 133.0 lb

## 2023-04-27 DIAGNOSIS — E039 Hypothyroidism, unspecified: Secondary | ICD-10-CM | POA: Diagnosis not present

## 2023-04-27 DIAGNOSIS — E559 Vitamin D deficiency, unspecified: Secondary | ICD-10-CM

## 2023-04-27 DIAGNOSIS — E538 Deficiency of other specified B group vitamins: Secondary | ICD-10-CM

## 2023-04-27 DIAGNOSIS — Z789 Other specified health status: Secondary | ICD-10-CM | POA: Diagnosis not present

## 2023-04-27 DIAGNOSIS — R7302 Impaired glucose tolerance (oral): Secondary | ICD-10-CM

## 2023-04-27 DIAGNOSIS — E7800 Pure hypercholesterolemia, unspecified: Secondary | ICD-10-CM

## 2023-04-27 DIAGNOSIS — Z0000 Encounter for general adult medical examination without abnormal findings: Secondary | ICD-10-CM

## 2023-04-27 DIAGNOSIS — I10 Essential (primary) hypertension: Secondary | ICD-10-CM

## 2023-04-27 DIAGNOSIS — Z0001 Encounter for general adult medical examination with abnormal findings: Secondary | ICD-10-CM

## 2023-04-27 LAB — LIPID PANEL
Cholesterol: 248 mg/dL — ABNORMAL HIGH (ref 0–200)
HDL: 58.3 mg/dL (ref >39.00)
NonHDL: 189.87
Total CHOL/HDL Ratio: 4
Triglycerides: 203 mg/dL — ABNORMAL HIGH (ref 0.0–149.0)
VLDL: 40.6 mg/dL — ABNORMAL HIGH (ref 0.0–40.0)

## 2023-04-27 LAB — VITAMIN B12: Vitamin B-12: 234 pg/mL (ref 211–911)

## 2023-04-27 LAB — BASIC METABOLIC PANEL
BUN: 13 mg/dL (ref 6–23)
CO2: 27 mEq/L (ref 19–32)
Calcium: 9.4 mg/dL (ref 8.4–10.5)
Chloride: 101 mEq/L (ref 96–112)
Creatinine, Ser: 0.68 mg/dL (ref 0.40–1.20)
GFR: 85.65 mL/min (ref >60.00)
Glucose, Bld: 89 mg/dL (ref 70–99)
Potassium: 4.5 mEq/L (ref 3.5–5.1)
Sodium: 137 mEq/L (ref 135–145)

## 2023-04-27 LAB — CBC WITH DIFFERENTIAL/PLATELET
Basophils Absolute: 0 10*3/uL (ref 0.0–0.1)
Basophils Relative: 0.8 % (ref 0.0–3.0)
Eosinophils Absolute: 0.3 10*3/uL (ref 0.0–0.7)
Eosinophils Relative: 4.9 % (ref 0.0–5.0)
HCT: 42.2 % (ref 36.0–46.0)
Hemoglobin: 14.2 g/dL (ref 12.0–15.0)
Lymphocytes Relative: 31.7 % (ref 12.0–46.0)
Lymphs Abs: 2 10*3/uL (ref 0.7–4.0)
MCHC: 33.7 g/dL (ref 30.0–36.0)
MCV: 101.2 fl — ABNORMAL HIGH (ref 78.0–100.0)
Monocytes Absolute: 0.7 10*3/uL (ref 0.1–1.0)
Monocytes Relative: 11.3 % (ref 3.0–12.0)
Neutro Abs: 3.2 10*3/uL (ref 1.4–7.7)
Neutrophils Relative %: 51.3 % (ref 43.0–77.0)
Platelets: 351 10*3/uL (ref 150.0–400.0)
RBC: 4.16 Mil/uL (ref 3.87–5.11)
RDW: 13.3 % (ref 11.5–15.5)
WBC: 6.3 10*3/uL (ref 4.0–10.5)

## 2023-04-27 LAB — HEPATIC FUNCTION PANEL
ALT: 14 U/L (ref 0–35)
AST: 16 U/L (ref 0–37)
Albumin: 4.2 g/dL (ref 3.5–5.2)
Alkaline Phosphatase: 60 U/L (ref 39–117)
Bilirubin, Direct: 0.1 mg/dL (ref 0.0–0.3)
Total Bilirubin: 0.4 mg/dL (ref 0.2–1.2)
Total Protein: 7.6 g/dL (ref 6.0–8.3)

## 2023-04-27 LAB — LDL CHOLESTEROL, DIRECT: Direct LDL: 175 mg/dL

## 2023-04-27 LAB — VITAMIN D 25 HYDROXY (VIT D DEFICIENCY, FRACTURES): VITD: 59.64 ng/mL (ref 30.00–100.00)

## 2023-04-27 LAB — TSH: TSH: 2.48 u[IU]/mL (ref 0.35–5.50)

## 2023-04-27 LAB — HEMOGLOBIN A1C: Hgb A1c MFr Bld: 5.8 % (ref 4.6–6.5)

## 2023-04-27 MED ORDER — ALPRAZOLAM 0.5 MG PO TABS: ORAL_TABLET | ORAL | 2 refills | Status: DC

## 2023-04-27 MED ORDER — LEVOTHYROXINE SODIUM 25 MCG PO TABS: 25.0000 ug | ORAL_TABLET | Freq: Every day | ORAL | 3 refills | Status: DC

## 2023-04-27 MED ORDER — CITALOPRAM HYDROBROMIDE 40 MG PO TABS: 40.0000 mg | ORAL_TABLET | Freq: Every day | ORAL | 3 refills | Status: DC

## 2023-04-27 MED ORDER — REPATHA SURECLICK 140 MG/ML ~~LOC~~ SOAJ: 140.0000 mg | SUBCUTANEOUS | 3 refills | Status: DC

## 2023-04-27 NOTE — Progress Notes (Signed)
Patient ID: Cindy Higgins, female   DOB: 11/14/1945, 77 y.o.   MRN: [id number]         Chief Complaint:: wellness exam and hld, hypreglycemia, low vit d, htn, low thyroid, low b12, alcohol use       HPI:  Cindy Higgins is a 77 y.o. female here for wellness exam; due for colonoscopy next month, declines covid booster, o/w up to date                        Also seeing optho every 6 mo now with glaucoma improving.  Also has desitin for lichen sclerosis as not able to take the clobetasol with the glaucoma  Pt denies chest pain, increased sob or doe, wheezing, orthopnea, PND, increased LE swelling, palpitations, dizziness or syncope.   Pt denies polydipsia, polyuria, or new focal neuro s/s.    Pt denies fever, wt loss, night sweats, loss of appetite, or other constitutional symptoms   Has lost wt with better diet, was 99 lbs when married and still feels she is overwt.  Has had little to no ETOH in past 6 months or more.   Wt Readings from Last 3 Encounters:  04/27/23 133 lb (60.3 kg)  10/25/22 142 lb (64.4 kg)  07/14/22 141 lb 12.8 oz (64.3 kg)   BP Readings from Last 3 Encounters:  04/27/23 124/72  10/25/22 114/68  09/25/22 (!) 140/77   Immunization History  Administered Date(s) Administered   Fluad Quad(high Dose 65+) 07/02/2019, 07/24/2020, 10/20/2021, 07/14/2022   Influenza Inj Mdck Quad Pf 09/10/2017   Influenza, High Dose Seasonal PF 12/09/2014, 08/10/2018, 06/28/2019   Influenza,inj,Quad PF,6+ Mos 08/16/2014, 08/21/2015   Influenza-Unspecified 11/09/2015, 08/08/2017   PFIZER(Purple Top)SARS-COV-2 Vaccination 01/14/2020, 02/13/2020, 09/27/2020, 09/15/2021   Pneumococcal Conjugate-13 12/09/2014, 08/21/2015   Pneumococcal Polysaccharide-23 08/20/2014   Td 11/17/2009   Td (Adult), 2 Lf Tetanus Toxid, Preservative Free 11/17/2009   Tdap 07/24/2020   Unspecified SARS-COV-2 Vaccination 01/14/2020, 02/13/2020   Zoster Recombinat (Shingrix) 06/08/2021   Zoster, Live 05/10/2011   Health  Maintenance Due  Topic Date Due   Colonoscopy  05/19/2023      Past Medical History:  Diagnosis Date   ALLERGIC RHINITIS 12/22/2007   Anxiety    ANXIETY DEPRESSION 08/30/2007   Arthritis    Left thumb bone on bone   BLURRED VISION, INTERMITTENT 12/22/2007   COLONIC POLYPS, HX OF 12/22/2007   01/18/2006   Glaucoma    NTG Glaucoma   GLUCOSE INTOLERANCE 07/08/2009   Headache(784.0) 12/22/2007   Herpes zoster of eye 05/10/2011   HYPERCHOLESTEROLEMIA 08/30/2007   HYPERLIPIDEMIA 03/26/2008   Hypothyroidism 07/24/2020   Impaired glucose tolerance 05/09/2011   MITRAL VALVE PROLAPSE, HX OF 08/30/2007   OSTEOPOROSIS 12/22/2007   POSTMENOPAUSAL STATUS 08/30/2007   SINUSITIS- ACUTE-NOS 02/06/2010   Past Surgical History:  Procedure Laterality Date   ABDOMINAL HYSTERECTOMY     AUGMENTATION MAMMAPLASTY     COLONOSCOPY     COSMETIC SURGERY     DILATION AND CURETTAGE OF UTERUS  2007   OOPHORECTOMY     POLYPECTOMY      reports that she has never smoked. She has never used smokeless tobacco. She reports current alcohol use of about 2.0 standard drinks of alcohol per week. She reports that she does not use drugs. family history includes Alcohol abuse in her father; Breast cancer in her maternal grandmother, sister, and sister; Cancer in her sister; Diabetes in her mother; Heart disease in  her mother; Hyperlipidemia in her mother; Testicular cancer in her son. Allergies  Allergen Reactions   Sulfa Antibiotics Anaphylaxis   Iodinated Contrast Media Hives   Iodine Other (See Comments)    Burning   Other Other (See Comments)   Statins Other (See Comments)   Zetia [Ezetimibe] Diarrhea   Codeine Itching and Rash   Penicillins Itching    Has patient had a PCN reaction causing immediate rash, facial/tongue/throat swelling, SOB or lightheadedness with hypotension:  Unknown think it was rash Has patient had a PCN reaction causing severe rash involving mucus membranes or skin necrosis: unknown  childhood Has patient had a PCN reaction that required hospitalization: unknown Has patient had a PCN reaction occurring within the last 10 years: no childhood If all of the above answers are "NO", then may proceed with Cephalosporin use.     Shingrix [Zoster Vac Recomb Adjuvanted] Rash    Severe arm erythema and heat   Current Outpatient Medications on File Prior to Visit  Medication Sig Dispense Refill   calcium-vitamin D (OSCAL WITH D) 250-125 MG-UNIT tablet Take 1 tablet by mouth daily.     estradiol (ESTRACE) 0.5 MG tablet Take 0.5 mg by mouth daily.     timolol (TIMOPTIC) 0.5 % ophthalmic solution 1 drop 2 (two) times daily.     vitamin E 45 MG (100 UNITS) capsule Take by mouth daily.     No current facility-administered medications on file prior to visit.        ROS:  All others reviewed and negative.  Objective        PE:  BP 124/72 (BP Location: Right Arm, Patient Position: Sitting, Cuff Size: Normal)   Pulse 94   Temp 98.4 F (36.9 C) (Oral)   Ht 5\' 4"  (1.626 m)   Wt 133 lb (60.3 kg)   SpO2 96%   BMI 22.83 kg/m                 Constitutional: Pt appears in NAD               HENT: Head: NCAT.                Right Ear: External ear normal.                 Left Ear: External ear normal.                Eyes: . Pupils are equal, round, and reactive to light. Conjunctivae and EOM are normal               Nose: without d/c or deformity               Neck: Neck supple. Gross normal ROM               Cardiovascular: Normal rate and regular rhythm.                 Pulmonary/Chest: Effort normal and breath sounds without rales or wheezing.                Abd:  Soft, NT, ND, + BS, no organomegaly               Neurological: Pt is alert. At baseline orientation, motor grossly intact               Skin: Skin is warm. No rashes, no other new lesions, LE edema - none  Psychiatric: Pt behavior is normal without agitation , mild nervous  Micro: none  Cardiac  tracings I have personally interpreted today:  none  Pertinent Radiological findings (summarize): none   Lab Results  Component Value Date   WBC 6.3 04/27/2023   HGB 14.2 04/27/2023   HCT 42.2 04/27/2023   PLT 351.0 04/27/2023   GLUCOSE 89 04/27/2023   CHOL 248 (H) 04/27/2023   TRIG 203.0 (H) 04/27/2023   HDL 58.30 04/27/2023   LDLDIRECT 175.0 04/27/2023   LDLCALC 152 (H) 04/16/2022   ALT 14 04/27/2023   AST 16 04/27/2023   NA 137 04/27/2023   K 4.5 04/27/2023   CL 101 04/27/2023   CREATININE 0.68 04/27/2023   BUN 13 04/27/2023   CO2 27 04/27/2023   TSH 2.48 04/27/2023   HGBA1C 5.8 04/27/2023   Assessment/Plan:  Cindy Higgins is a 77 y.o. White or Caucasian [1] female with  has a past medical history of ALLERGIC RHINITIS (12/22/2007), Anxiety, ANXIETY DEPRESSION (08/30/2007), Arthritis, BLURRED VISION, INTERMITTENT (12/22/2007), COLONIC POLYPS, HX OF (12/22/2007), Glaucoma, GLUCOSE INTOLERANCE (07/08/2009), Headache(784.0) (12/22/2007), Herpes zoster of eye (05/10/2011), HYPERCHOLESTEROLEMIA (08/30/2007), HYPERLIPIDEMIA (03/26/2008), Hypothyroidism (07/24/2020), Impaired glucose tolerance (05/09/2011), MITRAL VALVE PROLAPSE, HX OF (08/30/2007), OSTEOPOROSIS (12/22/2007), POSTMENOPAUSAL STATUS (08/30/2007), and SINUSITIS- ACUTE-NOS (02/06/2010).  Encounter for well adult exam with abnormal findings Age and sex appropriate education and counseling updated with regular exercise and diet Referrals for preventative services - for colnoscopy Immunizations addressed - declines covid booster Smoking counseling  - none needed Evidence for depression or other mood disorder - none significant Most recent labs reviewed. I have personally reviewed and have noted: 1) the patient's medical and social history 2) The patient's current medications and supplements 3) The patient's height, weight, and BMI have been recorded in the chart   Alcohol use None recent, pt intends to abstain  further  HYPERCHOLESTEROLEMIA Lab Results  Component Value Date   LDLCALC 152 (H) 04/16/2022   uncontrolled pt has been statin and zetia intolerant, ok for repatha 140 mg q 2 wks   Impaired glucose tolerance Lab Results  Component Value Date   HGBA1C 5.8 04/27/2023   Stable, pt to continue current medical treatment  - diet, wt control   Vitamin d deficiency Last vitamin D Lab Results  Component Value Date   VD25OH 59.64 04/27/2023   Stable, cont oral replacement   Hypertension, uncontrolled BP Readings from Last 3 Encounters:  04/27/23 124/72  10/25/22 114/68  09/25/22 (!) 140/77   Stable, pt to continue medical treatment  - diet, wt control   Hypothyroidism Lab Results  Component Value Date   TSH 2.48 04/27/2023   Stable, pt to continue levothyroxine 25 mcg qd   B12 deficiency Lab Results  Component Value Date   VITAMINB12 234 04/27/2023   Low, to start oral replacement - b12 1000 mcg qd  Followup: Return in about 1 year (around 04/26/2024).  Oliver Barre, MD 04/30/2023 5:21 PM [facility]

## 2023-04-27 NOTE — Progress Notes (Signed)
Patient ID: Cindy Higgins, female   DOB: 07-28-1946, 77 y.o.   MRN: [id number]

## 2023-04-27 NOTE — Patient Instructions (Signed)

## 2023-04-30 ENCOUNTER — Encounter: Payer: Self-pay | Admitting: Internal Medicine

## 2023-04-30 NOTE — Assessment & Plan Note (Signed)
Lab Results  Component Value Date   VITAMINB12 234 04/27/2023   Low, to start oral replacement - b12 1000 mcg qd

## 2023-04-30 NOTE — Assessment & Plan Note (Signed)
Last vitamin D Lab Results  Component Value Date   VD25OH 59.64 04/27/2023   Stable, cont oral replacement

## 2023-04-30 NOTE — Assessment & Plan Note (Signed)
Lab Results  Component Value Date   HGBA1C 5.8 04/27/2023   Stable, pt to continue current medical treatment  - diet, wt control  

## 2023-04-30 NOTE — Assessment & Plan Note (Signed)
None recent, pt intends to abstain further

## 2023-04-30 NOTE — Assessment & Plan Note (Signed)
Lab Results  Component Value Date   LDLCALC 152 (H) 04/16/2022   uncontrolled pt has been statin and zetia intolerant, ok for repatha 140 mg q 2 wks

## 2023-04-30 NOTE — Assessment & Plan Note (Signed)
Lab Results  Component Value Date   TSH 2.48 04/27/2023   Stable, pt to continue levothyroxine 25 mcg qd

## 2023-04-30 NOTE — Assessment & Plan Note (Signed)
BP Readings from Last 3 Encounters:  04/27/23 124/72  10/25/22 114/68  09/25/22 (!) 140/77   Stable, pt to continue medical treatment  - diet, wt control

## 2023-04-30 NOTE — Assessment & Plan Note (Signed)
Age and sex appropriate education and counseling updated with regular exercise and diet Referrals for preventative services - for colnoscopy Immunizations addressed - declines covid booster Smoking counseling  - none needed Evidence for depression or other mood disorder - none significant Most recent labs reviewed. I have personally reviewed and have noted: 1) the patient's medical and social history 2) The patient's current medications and supplements 3) The patient's height, weight, and BMI have been recorded in the chart

## 2023-05-03 ENCOUNTER — Telehealth: Payer: Self-pay

## 2023-05-03 NOTE — Telephone Encounter (Signed)
Pharmacy Patient Advocate Encounter   Received notification that prior authorization for Repatha [employer] is required/requested.   PA submitted to [facility] via CoverMyMeds Key # [id number] Status is pending

## 2023-05-03 NOTE — Telephone Encounter (Signed)
PA has been APPROVED from 05/03/2023-11/08/2023

## 2023-05-19 DIAGNOSIS — L900 Lichen sclerosus et atrophicus: Secondary | ICD-10-CM | POA: Diagnosis not present

## 2023-06-06 ENCOUNTER — Encounter: Payer: Self-pay | Admitting: Internal Medicine

## 2023-06-06 DIAGNOSIS — E7800 Pure hypercholesterolemia, unspecified: Secondary | ICD-10-CM

## 2023-06-23 ENCOUNTER — Encounter (INDEPENDENT_AMBULATORY_CARE_PROVIDER_SITE_OTHER): Payer: Self-pay

## 2023-07-14 ENCOUNTER — Encounter (HOSPITAL_BASED_OUTPATIENT_CLINIC_OR_DEPARTMENT_OTHER): Payer: Self-pay | Admitting: Internal Medicine

## 2023-07-14 ENCOUNTER — Ambulatory Visit (INDEPENDENT_AMBULATORY_CARE_PROVIDER_SITE_OTHER): Payer: Medicare HMO | Admitting: Nurse Practitioner

## 2023-07-14 ENCOUNTER — Ambulatory Visit (HOSPITAL_BASED_OUTPATIENT_CLINIC_OR_DEPARTMENT_OTHER): Payer: Medicare HMO | Admitting: Internal Medicine

## 2023-07-14 VITALS — BP 138/78 | HR 74 | Ht 64.0 in | Wt 135.8 lb

## 2023-07-14 VITALS — BP 114/62 | HR 84 | Temp 98.2°F | Ht 64.0 in | Wt 136.4 lb

## 2023-07-14 DIAGNOSIS — E7800 Pure hypercholesterolemia, unspecified: Secondary | ICD-10-CM

## 2023-07-14 DIAGNOSIS — R519 Headache, unspecified: Secondary | ICD-10-CM | POA: Diagnosis not present

## 2023-07-14 DIAGNOSIS — R21 Rash and other nonspecific skin eruption: Secondary | ICD-10-CM | POA: Insufficient documentation

## 2023-07-14 LAB — CBC WITH DIFFERENTIAL/PLATELET
Basophils Absolute: 0.1 10*3/uL (ref 0.0–0.1)
Basophils Relative: 1.2 % (ref 0.0–3.0)
Eosinophils Absolute: 0.3 10*3/uL (ref 0.0–0.7)
Eosinophils Relative: 5.1 % — ABNORMAL HIGH (ref 0.0–5.0)
HCT: 41.9 % (ref 36.0–46.0)
Hemoglobin: 14 g/dL (ref 12.0–15.0)
Lymphocytes Relative: 33.4 % (ref 12.0–46.0)
Lymphs Abs: 2 10*3/uL (ref 0.7–4.0)
MCHC: 33.3 g/dL (ref 30.0–36.0)
MCV: 102.2 fl — ABNORMAL HIGH (ref 78.0–100.0)
Monocytes Absolute: 0.6 10*3/uL (ref 0.1–1.0)
Monocytes Relative: 10.1 % (ref 3.0–12.0)
Neutro Abs: 3 10*3/uL (ref 1.4–7.7)
Neutrophils Relative %: 50.2 % (ref 43.0–77.0)
Platelets: 318 10*3/uL (ref 150.0–400.0)
RBC: 4.1 Mil/uL (ref 3.87–5.11)
RDW: 13.1 % (ref 11.5–15.5)
WBC: 6 10*3/uL (ref 4.0–10.5)

## 2023-07-14 LAB — POC COVID19 BINAXNOW: SARS Coronavirus 2 Ag: NEGATIVE

## 2023-07-14 NOTE — Assessment & Plan Note (Signed)
Rash may very well be ecchymosis which could be a side effect of the Aleve, check CBC.  Platelet count normal no additional workup recommended today but to follow-up in 1 month with either myself or her PCP for close monitoring

## 2023-07-14 NOTE — Addendum Note (Signed)
Addended by: Jiles Prows E on: 07/14/2023 02:06 PM   Modules accepted: Orders

## 2023-07-14 NOTE — Patient Instructions (Signed)
Medication Instructions:  NO CHANGES today   *If you need a refill on your cardiac medications before your next appointment, please call your pharmacy*   Lab Work: LPa today     Follow-Up: At [facility], you and your health needs are our priority.  As part of our continuing mission to provide you with exceptional heart care, we have created designated Provider Care Teams.  These Care Teams include your primary Cardiologist (physician) and Advanced Practice Providers (APPs -  Physician Assistants and Nurse Practitioners) who all work together to provide you with the care you need, when you need it.  We recommend signing up for the patient portal called "MyChart".  Sign up information is provided on this After Visit Summary.  MyChart is used to connect with patients for Virtual Visits (Telemedicine).  Patients are able to view lab/test results, encounter notes, upcoming appointments, etc.  Non-urgent messages can be sent to your provider as well.   To learn more about what you can do with MyChart, go to [email or web address].    Your next appointment:    AS NEEDED with Dr. Rennis Golden

## 2023-07-14 NOTE — Progress Notes (Addendum)
Established Patient Office Visit  Subjective   Patient ID: Cindy Higgins, female    DOB: 1946-05-15  Age: 77 y.o. MRN: [id number]  Chief Complaint  Patient presents with   Headache    Headache: Onset 10 days ago, has been constant.  Also noticed some pain in her neck.  Denies any trauma or new exercises/activity preceding the headache.  Is now on estradiol vaginal cream, otherwise no new changes to medications.  Also noticed rashes starting to pop up on her arms.  There are these little spots that show up as red and then turned into what looks like a bruise.  She has been taking Aleve as needed for this headache.  Reports the headache is moderate in severity.  Does have history of ocular migraines without associated headache.  No nausea or vomiting.  No double vision. Headache starts at base of neck and radiates up to scalp.  Reports she does have some interpersonal relationship strain which has been stressful, but that the headache started before recent new stressor.  She denies any history of similar headache, however per chart review has had tension headaches in the past.      Review of Systems  Constitutional:  Negative for chills and fever.  Eyes:  Negative for blurred vision and double vision.  Cardiovascular:  Negative for chest pain and palpitations.  Gastrointestinal:  Positive for diarrhea (after eating). Negative for nausea and vomiting.  Skin:  Positive for rash. Negative for itching.  Neurological:  Positive for headaches. Negative for dizziness and loss of consciousness.      Objective:     BP 114/62   Pulse 84   Temp 98.2 F (36.8 C) (Temporal)   Ht 5\' 4"  (1.626 m)   Wt 136 lb 6 oz (61.9 kg)   SpO2 97%   BMI 23.41 kg/m  BP Readings from Last 3 Encounters:  07/14/23 114/62  07/14/23 138/78  04/27/23 124/72   Wt Readings from Last 3 Encounters:  07/14/23 136 lb 6 oz (61.9 kg)  07/14/23 135 lb 12.8 oz (61.6 kg)  04/27/23 133 lb (60.3 kg)      Physical  Exam Vitals reviewed.  Constitutional:      General: She is not in acute distress.    Appearance: Normal appearance.  HENT:     Head: Normocephalic and atraumatic.  Eyes:     Extraocular Movements: Extraocular movements intact.  Neck:     Vascular: No carotid bruit.  Cardiovascular:     Rate and Rhythm: Normal rate and regular rhythm.     Pulses: Normal pulses.     Heart sounds: Normal heart sounds.  Pulmonary:     Effort: Pulmonary effort is normal.     Breath sounds: Normal breath sounds.  Musculoskeletal:     Cervical back: Normal range of motion.  Skin:    General: Skin is warm and dry.     Comments: Multiple flat lesions around 3 mm in diameter on bilateral upper arms, scattered, nontender, surrounding tissue within normal limits.  Appears similar to ecchymosis  Neurological:     General: No focal deficit present.     Mental Status: She is alert and oriented to person, place, and time.     Cranial Nerves: Cranial nerves 2-12 are intact.     Sensory: Sensation is intact.     Motor: Motor function is intact.     Coordination: Coordination is intact.  Psychiatric:        Mood  and Affect: Mood normal.        Behavior: Behavior normal.        Judgment: Judgment normal.      Results for orders placed or performed in visit on 07/14/23  POC COVID-19 BinaxNow  Result Value Ref Range   SARS Coronavirus 2 Ag Negative Negative      The 10-year ASCVD risk score (Arnett DK, et al., 2019) is: 15.8%    Assessment & Plan:   Problem List Items Addressed This Visit       Musculoskeletal and Integument   Rash    Rash may very well be ecchymosis which could be a side effect of the Aleve, check CBC.  Platelet count normal no additional workup recommended today but to follow-up in 1 month with either myself or her PCP for close monitoring      Relevant Orders   CBC with Differential/Platelet     Other   Headache - Primary    Acute Per patient new onset of new type  headache No red flag neurologic symptoms Due to age and new onset of headache will recommend CT scan of brain for further evaluation In the meantime patient can trial Excedrin as needed or use Aleve as needed Rash may very well be ecchymosis which could be a side effect of the Aleve, check CBC.  Platelet count normal no additional workup recommended today but to follow-up in 1 month with either myself or her PCP for close monitoring      Relevant Orders   CT HEAD WO CONTRAST ( )   POC COVID-19 BinaxNow (Completed)    Return in about 1 month (around 08/13/2023) for F/U with Chasady Longwell or Dr. Jonny Ruiz.    Elenore Paddy, NP

## 2023-07-14 NOTE — Progress Notes (Signed)
LIPID CLINIC CONSULT NOTE  Chief Complaint:  Manage dyslipidemia  Primary Care Physician: Cindy Levins, MD  Primary Cardiologist:  None  HPI:  Cindy Higgins is a 77 y.o. female who is being seen today for the evaluation of dyslipidemia at the request of Cindy Levins, MD. this is a pleasant 77 year old female kindly referred for management of dyslipidemia.  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  She reports a family history of high cholesterol and her sisters and mother but no early onset heart disease.  She had a calcium score at age 29 which was 0.  For years she says she has been on a number of different statin medications which all caused myalgias and other side effects and then she had recently tried ezetimibe which caused diarrhea.  Subsequently her PCP ordered Repatha but she apparently was not notified that she was to take this medication and had some concerns about it.  Then she was referred to our office for further recommendations.  She does have some concerns about the Repatha as one of her sisters is on it and noted that it has worsened some of her COPD symptoms.  Her most recent lipid profile showed a direct LDL of 175, total cholesterol 248, triglycerides 203 and HDL 58.  Her direct LDL had been 190 in 2022.  These findings do support likely familial hyperlipidemia however she may have a lower risk variant.  We discussed options for further risk ratified her including possible genetic testing, repeat calcium scoring although it is quite early to do that, and or assessing an LP(a) which may impact her decision on treatment if it were significantly elevated.  It is actually somewhat reassuring that she had no detectable coronary artery disease at age 44.  PMHx:  Past Medical History:  Diagnosis Date   ALLERGIC RHINITIS 12/22/2007   Anxiety    ANXIETY DEPRESSION 08/30/2007   Arthritis    Left thumb bone on bone   BLURRED VISION,  INTERMITTENT 12/22/2007   COLONIC POLYPS, HX OF 12/22/2007   01/18/2006   Glaucoma    NTG Glaucoma   GLUCOSE INTOLERANCE 07/08/2009   Headache(784.0) 12/22/2007   Herpes zoster of eye 05/10/2011   HYPERCHOLESTEROLEMIA 08/30/2007   HYPERLIPIDEMIA 03/26/2008   Hypothyroidism 07/24/2020   Impaired glucose tolerance 05/09/2011   MITRAL VALVE PROLAPSE, HX OF 08/30/2007   OSTEOPOROSIS 12/22/2007   POSTMENOPAUSAL STATUS 08/30/2007   SINUSITIS- ACUTE-NOS 02/06/2010    Past Surgical History:  Procedure Laterality Date   ABDOMINAL HYSTERECTOMY     AUGMENTATION MAMMAPLASTY     COLONOSCOPY     COSMETIC SURGERY     DILATION AND CURETTAGE OF UTERUS  2007   OOPHORECTOMY     POLYPECTOMY      FAMHx:  Family History  Problem Relation Age of Onset   Heart disease Mother    Diabetes Mother    Hyperlipidemia Mother    Alcohol abuse Father    Breast cancer Sister    Cancer Sister    Breast cancer Sister    Breast cancer Maternal Grandmother    Testicular cancer Son     SOCHx:   reports that she has never smoked. She has never used smokeless tobacco. She reports current alcohol use of about 2.0 standard drinks of alcohol per week. She reports that she does not use drugs.  ALLERGIES:  Allergies  Allergen Reactions   Sulfa Antibiotics Anaphylaxis   Iodinated Contrast  Media Hives   Iodine Other (See Comments)    Burning   Other Other (See Comments)   Statins Other (See Comments)   Zetia [Ezetimibe] Diarrhea   Codeine Itching and Rash   Penicillins Itching    Has patient had a PCN reaction causing immediate rash, facial/tongue/throat swelling, SOB or lightheadedness with hypotension:  Unknown think it was rash Has patient had a PCN reaction causing severe rash involving mucus membranes or skin necrosis: unknown childhood Has patient had a PCN reaction that required hospitalization: unknown Has patient had a PCN reaction occurring within the last 10 years: no childhood If all of the above  answers are "NO", then may proceed with Cephalosporin use.     Shingrix [Zoster Vac Recomb Adjuvanted] Rash    Severe arm erythema and heat    ROS: Pertinent items noted in HPI and remainder of comprehensive ROS otherwise negative.  HOME MEDS: Current Outpatient Medications on File Prior to Visit  Medication Sig Dispense Refill   ALPRAZolam (XANAX) 0.5 MG tablet TAKE 1 TABLET BY MOUTH 2 TIMES DAILY AS NEEDED. 60 tablet 2   calcium-vitamin D (OSCAL WITH D) 250-125 MG-UNIT tablet Take 1 tablet by mouth daily.     citalopram (CELEXA) 40 MG tablet Take 1 tablet (40 mg total) by mouth daily. 90 tablet 3   estradiol (ESTRACE) 0.5 MG tablet Take 0.5 mg by mouth daily.     levothyroxine (SYNTHROID) 25 MCG tablet Take 1 tablet (25 mcg total) by mouth daily before breakfast. 90 tablet 3   timolol (TIMOPTIC) 0.5 % ophthalmic solution 1 drop 2 (two) times daily.     vitamin E 45 MG (100 UNITS) capsule Take by mouth daily.     Evolocumab (REPATHA SURECLICK) 140 MG/ML SOAJ Inject 140 mg into the skin every 14 (fourteen) days. (Patient not taking: Reported on 07/14/2023) 6 mL 3   No current facility-administered medications on file prior to visit.    LABS/IMAGING: No results found for this or any previous visit (from the past 48 hour(s)). No results found.  LIPID PANEL:    Component Value Date/Time   CHOL 248 (H) 04/27/2023 1404   TRIG 203.0 (H) 04/27/2023 1404   HDL 58.30 04/27/2023 1404   CHOLHDL 4 04/27/2023 1404   VLDL 40.6 (H) 04/27/2023 1404   LDLCALC 152 (H) 04/16/2022 1110   LDLDIRECT 175.0 04/27/2023 1404    WEIGHTS: Wt Readings from Last 3 Encounters:  07/14/23 135 lb 12.8 oz (61.6 kg)  04/27/23 133 lb (60.3 kg)  10/25/22 142 lb (64.4 kg)    VITALS: BP 138/78 (BP Location: Left Arm, Patient Position: Sitting, Cuff Size: Normal)   Pulse 74   Ht 5\' 4"  (1.626 m)   Wt 135 lb 12.8 oz (61.6 kg)   SpO2 97%   BMI 23.31 kg/m    EXAM: Deferred  EKG: Deferred  ASSESSMENT: Probable familial hyperlipidemia, LDL greater than 190, based on Simon Broome criteria Multiple first-degree relatives with high cholesterol 0 CAC score (2020) Statin and ezetimibe intolerance-myalgias, diarrhea  PLAN: 1.   [name] has a probable familial hyperlipidemia with LDL greater than 190 based on Simon Broome criteria and multiple family members with high cholesterol although no early onset heart disease.  She also had no coronary calcium at age 43 which is a reasonably good negative risk predictor.  I would advise an LP(a) which might be elevated and could change whether she would elect to be on therapy or not.  I think it  is somewhat early to consider repeat calcium scoring.  Generally this is advised at 7 to 10 years after an initial study.  I did discuss genetic testing however she did not seem interested in that at this time.  I will contact her with the results of her LP(a) and if she elects therapy she already has prior authorization for the Repatha and could simply pick up the medicine and start taking it.  She understands that an elevated LP(a) would not be affected by statins, diet or lifestyle but could be improved with Repatha if it were elevated.  Thanks again for the kind referral.  Follow-up will likely be as needed.  Chrystie Nose, MD, [facility], FACP  [place]  [facility]  Medical Director of the Advanced Lipid Disorders &  Cardiovascular Risk Reduction Clinic Diplomate of the American Board of Clinical Lipidology Attending Cardiologist  Direct Dial: [phone]  Fax: [phone]  Website:  [email or web address] Nicely Devonne Lalani 07/14/2023, 10:55 AM

## 2023-07-14 NOTE — Assessment & Plan Note (Signed)
Acute Per patient new onset of new type headache No red flag neurologic symptoms Due to age and new onset of headache will recommend CT scan of brain for further evaluation In the meantime patient can trial Excedrin as needed or use Aleve as needed Rash may very well be ecchymosis which could be a side effect of the Aleve, check CBC.  Platelet count normal no additional workup recommended today but to follow-up in 1 month with either myself or her PCP for close monitoring

## 2023-07-14 NOTE — Patient Instructions (Signed)
Excedrin

## 2023-07-15 LAB — LIPOPROTEIN A (LPA): Lipoprotein (a): 27.4 nmol/L (ref <75.0)

## 2023-07-18 ENCOUNTER — Encounter (HOSPITAL_BASED_OUTPATIENT_CLINIC_OR_DEPARTMENT_OTHER): Payer: Self-pay | Admitting: Internal Medicine

## 2023-08-01 ENCOUNTER — Other Ambulatory Visit: Payer: Self-pay | Admitting: Obstetrics and Gynecology

## 2023-08-01 DIAGNOSIS — Z1231 Encounter for screening mammogram for malignant neoplasm of breast: Secondary | ICD-10-CM

## 2023-08-03 DIAGNOSIS — Z0100 Encounter for examination of eyes and vision without abnormal findings: Secondary | ICD-10-CM | POA: Diagnosis not present

## 2023-08-15 ENCOUNTER — Ambulatory Visit (INDEPENDENT_AMBULATORY_CARE_PROVIDER_SITE_OTHER): Payer: Medicare HMO | Admitting: Internal Medicine

## 2023-08-15 ENCOUNTER — Encounter: Payer: Self-pay | Admitting: Internal Medicine

## 2023-08-15 VITALS — BP 120/74 | HR 80 | Temp 98.0°F | Ht 64.0 in | Wt 134.0 lb

## 2023-08-15 DIAGNOSIS — Z23 Encounter for immunization: Secondary | ICD-10-CM | POA: Diagnosis not present

## 2023-08-15 DIAGNOSIS — E538 Deficiency of other specified B group vitamins: Secondary | ICD-10-CM | POA: Diagnosis not present

## 2023-08-15 DIAGNOSIS — I10 Essential (primary) hypertension: Secondary | ICD-10-CM | POA: Diagnosis not present

## 2023-08-15 DIAGNOSIS — E7800 Pure hypercholesterolemia, unspecified: Secondary | ICD-10-CM

## 2023-08-15 DIAGNOSIS — R7302 Impaired glucose tolerance (oral): Secondary | ICD-10-CM

## 2023-08-15 DIAGNOSIS — G4486 Cervicogenic headache: Secondary | ICD-10-CM | POA: Diagnosis not present

## 2023-08-15 DIAGNOSIS — R9431 Abnormal electrocardiogram [ECG] [EKG]: Secondary | ICD-10-CM | POA: Diagnosis not present

## 2023-08-15 MED ORDER — MELOXICAM 15 MG PO TABS: 15.0000 mg | ORAL_TABLET | Freq: Every day | ORAL | 1 refills | Status: DC | PRN

## 2023-08-15 NOTE — Progress Notes (Unsigned)
Patient ID: Cindy Higgins, female   DOB: May 01, 1946, 77 y.o.   MRN: [id number]        Chief Complaint: follow up HTN, HLD and hyperglycemia ***       HPI:  Cindy Higgins is a 77 y.o. female here with c/o        Wt Readings from Last 3 Encounters:  08/15/23 134 lb (60.8 kg)  07/14/23 136 lb 6 oz (61.9 kg)  07/14/23 135 lb 12.8 oz (61.6 kg)   BP Readings from Last 3 Encounters:  08/15/23 120/74  07/14/23 114/62  07/14/23 138/78         Past Medical History:  Diagnosis Date   ALLERGIC RHINITIS 12/22/2007   Anxiety    ANXIETY DEPRESSION 08/30/2007   Arthritis    Left thumb bone on bone   BLURRED VISION, INTERMITTENT 12/22/2007   COLONIC POLYPS, HX OF 12/22/2007   01/18/2006   Glaucoma    NTG Glaucoma   GLUCOSE INTOLERANCE 07/08/2009   Headache(784.0) 12/22/2007   Herpes zoster of eye 05/10/2011   HYPERCHOLESTEROLEMIA 08/30/2007   HYPERLIPIDEMIA 03/26/2008   Hypothyroidism 07/24/2020   Impaired glucose tolerance 05/09/2011   MITRAL VALVE PROLAPSE, HX OF 08/30/2007   OSTEOPOROSIS 12/22/2007   POSTMENOPAUSAL STATUS 08/30/2007   SINUSITIS- ACUTE-NOS 02/06/2010   Past Surgical History:  Procedure Laterality Date   ABDOMINAL HYSTERECTOMY     AUGMENTATION MAMMAPLASTY     COLONOSCOPY     COSMETIC SURGERY     DILATION AND CURETTAGE OF UTERUS  2007   OOPHORECTOMY     POLYPECTOMY      reports that she has never smoked. She has never used smokeless tobacco. She reports current alcohol use of about 2.0 standard drinks of alcohol per week. She reports that she does not use drugs. family history includes Alcohol abuse in her father; Breast cancer in her maternal grandmother, sister, and sister; Cancer in her sister; Diabetes in her mother; Heart disease in her mother; Hyperlipidemia in her mother; Testicular cancer in her son. Allergies  Allergen Reactions   Sulfa Antibiotics Anaphylaxis   Iodinated Contrast Media Hives   Iodine Other (See Comments)    Burning   Other Other (See Comments)    Statins Other (See Comments)   Zetia [Ezetimibe] Diarrhea   Codeine Itching and Rash   Penicillins Itching    Has patient had a PCN reaction causing immediate rash, facial/tongue/throat swelling, SOB or lightheadedness with hypotension:  Unknown think it was rash Has patient had a PCN reaction causing severe rash involving mucus membranes or skin necrosis: unknown childhood Has patient had a PCN reaction that required hospitalization: unknown Has patient had a PCN reaction occurring within the last 10 years: no childhood If all of the above answers are "NO", then may proceed with Cephalosporin use.     Shingrix [Zoster Vac Recomb Adjuvanted] Rash    Severe arm erythema and heat   Current Outpatient Medications on File Prior to Visit  Medication Sig Dispense Refill   ALPRAZolam (XANAX) 0.5 MG tablet TAKE 1 TABLET BY MOUTH 2 TIMES DAILY AS NEEDED. 60 tablet 2   calcium-vitamin D (OSCAL WITH D) 250-125 MG-UNIT tablet Take 1 tablet by mouth daily.     citalopram (CELEXA) 40 MG tablet Take 1 tablet (40 mg total) by mouth daily. 90 tablet 3   estradiol (ESTRACE) 0.1 MG/GM vaginal cream Place 1 Applicatorful vaginally at bedtime.     estradiol (ESTRACE) 0.5 MG tablet Take 0.5 mg by mouth  daily.     Evolocumab (REPATHA SURECLICK) 140 MG/ML SOAJ Inject 140 mg into the skin every 14 (fourteen) days. 6 mL 3   levothyroxine (SYNTHROID) 25 MCG tablet Take 1 tablet (25 mcg total) by mouth daily before breakfast. 90 tablet 3   timolol (TIMOPTIC) 0.5 % ophthalmic solution 1 drop 2 (two) times daily.     vitamin E 45 MG (100 UNITS) capsule Take by mouth daily.     No current facility-administered medications on file prior to visit.        ROS:  All others reviewed and negative.  Objective        PE:  BP 120/74 (BP Location: Left Arm, Patient Position: Sitting, Cuff Size: Normal)   Pulse 80   Temp 98 F (36.7 C) (Oral)   Ht 5\' 4"  (1.626 m)   Wt 134 lb (60.8 kg)   SpO2 98%   BMI 23.00 kg/m                  Constitutional: Pt appears in NAD               HENT: Head: NCAT.                Right Ear: External ear normal.                 Left Ear: External ear normal.                Eyes: . Pupils are equal, round, and reactive to light. Conjunctivae and EOM are normal               Nose: without d/c or deformity               Neck: Neck supple. Gross normal ROM               Cardiovascular: Normal rate and regular rhythm.                 Pulmonary/Chest: Effort normal and breath sounds without rales or wheezing.                Abd:  Soft, NT, ND, + BS, no organomegaly               Neurological: Pt is alert. At baseline orientation, motor grossly intact               Skin: Skin is warm. No rashes, no other new lesions, LE edema - ***               Psychiatric: Pt behavior is normal without agitation   Micro: none  Cardiac tracings I have personally interpreted today:  none  Pertinent Radiological findings (summarize): none   Lab Results  Component Value Date   WBC 6.0 07/14/2023   HGB 14.0 07/14/2023   HCT 41.9 07/14/2023   PLT 318.0 07/14/2023   GLUCOSE 89 04/27/2023   CHOL 248 (H) 04/27/2023   TRIG 203.0 (H) 04/27/2023   HDL 58.30 04/27/2023   LDLDIRECT 175.0 04/27/2023   LDLCALC 152 (H) 04/16/2022   ALT 14 04/27/2023   AST 16 04/27/2023   NA 137 04/27/2023   K 4.5 04/27/2023   CL 101 04/27/2023   CREATININE 0.68 04/27/2023   BUN 13 04/27/2023   CO2 27 04/27/2023   TSH 2.48 04/27/2023   HGBA1C 5.8 04/27/2023   Assessment/Plan:  Cindy Higgins is a 77 y.o. White or Caucasian [1] female with  has a past medical history of ALLERGIC RHINITIS (12/22/2007), Anxiety, ANXIETY DEPRESSION (08/30/2007), Arthritis, BLURRED VISION, INTERMITTENT (12/22/2007), COLONIC POLYPS, HX OF (12/22/2007), Glaucoma, GLUCOSE INTOLERANCE (07/08/2009), Headache(784.0) (12/22/2007), Herpes zoster of eye (05/10/2011), HYPERCHOLESTEROLEMIA (08/30/2007), HYPERLIPIDEMIA (03/26/2008), Hypothyroidism  (07/24/2020), Impaired glucose tolerance (05/09/2011), MITRAL VALVE PROLAPSE, HX OF (08/30/2007), OSTEOPOROSIS (12/22/2007), POSTMENOPAUSAL STATUS (08/30/2007), and SINUSITIS- ACUTE-NOS (02/06/2010).  No problem-specific Assessment & Plan notes found for this encounter.  Followup: Return if symptoms worsen or fail to improve.  Oliver Barre, MD 08/15/2023 2:50 PM [facility]

## 2023-08-15 NOTE — Patient Instructions (Signed)
Please take all new medication as prescribed= - the mobic as needed for pain  Please continue all other medications as before, and refills have been done if requested.  Please have the pharmacy call with any other refills you may need.  Please continue your efforts at being more active, low cholesterol diet, and weight control.  Please keep your appointments with your specialists as you may have planned  You will be contacted regarding the referral for: Ct Cardiac Score

## 2023-08-17 ENCOUNTER — Encounter: Payer: Self-pay | Admitting: Internal Medicine

## 2023-08-17 DIAGNOSIS — G4486 Cervicogenic headache: Secondary | ICD-10-CM | POA: Insufficient documentation

## 2023-08-17 NOTE — Assessment & Plan Note (Signed)
BP Readings from Last 3 Encounters:  08/15/23 120/74  07/14/23 114/62  07/14/23 138/78   Stable, pt to continue medical treatment  - diet, wt control

## 2023-08-17 NOTE — Assessment & Plan Note (Signed)
Lab Results  Component Value Date   HGBA1C 5.8 04/27/2023   Stable, pt to continue current medical treatment  - diet, wt control  

## 2023-08-17 NOTE — Assessment & Plan Note (Signed)
Lab Results  Component Value Date   VITAMINB12 234 04/27/2023   Low, reminded to start oral replacement - b12 1000 mcg qd

## 2023-08-17 NOTE — Assessment & Plan Note (Signed)
Lab Results  Component Value Date   LDLCALC 152 (H) 04/16/2022   Uncontrolled, pt not sure if wants to take any antiliipid med, now for Card Ct score to further assess

## 2023-08-17 NOTE — Assessment & Plan Note (Signed)
D/w pt - I suspect underlying c spine spondylosis - for tylenol prn, declines xray for but will call if needs this

## 2023-09-01 DIAGNOSIS — H401233 Low-tension glaucoma, bilateral, severe stage: Secondary | ICD-10-CM | POA: Diagnosis not present

## 2023-09-05 ENCOUNTER — Ambulatory Visit: Payer: Medicare HMO

## 2023-09-07 ENCOUNTER — Ambulatory Visit (HOSPITAL_BASED_OUTPATIENT_CLINIC_OR_DEPARTMENT_OTHER)
Admission: RE | Admit: 2023-09-07 | Discharge: 2023-09-07 | Disposition: A | Discharge status: Home / Self Care | Payer: Medicare HMO | Source: Ambulatory Visit | Attending: Internal Medicine | Admitting: Internal Medicine

## 2023-09-07 DIAGNOSIS — R7302 Impaired glucose tolerance (oral): Secondary | ICD-10-CM | POA: Insufficient documentation

## 2023-09-07 DIAGNOSIS — E7800 Pure hypercholesterolemia, unspecified: Secondary | ICD-10-CM | POA: Insufficient documentation

## 2023-09-07 DIAGNOSIS — R9431 Abnormal electrocardiogram [ECG] [EKG]: Secondary | ICD-10-CM | POA: Insufficient documentation

## 2023-09-07 DIAGNOSIS — I10 Essential (primary) hypertension: Secondary | ICD-10-CM | POA: Insufficient documentation

## 2023-09-12 DIAGNOSIS — L304 Erythema intertrigo: Secondary | ICD-10-CM | POA: Diagnosis not present

## 2023-09-12 DIAGNOSIS — L820 Inflamed seborrheic keratosis: Secondary | ICD-10-CM | POA: Diagnosis not present

## 2023-09-28 ENCOUNTER — Ambulatory Visit
Admission: RE | Admit: 2023-09-28 | Discharge: 2023-09-28 | Disposition: A | Discharge status: Home / Self Care | Payer: Medicare HMO | Source: Ambulatory Visit | Attending: Obstetrics and Gynecology | Admitting: Obstetrics and Gynecology

## 2023-09-28 DIAGNOSIS — Z1231 Encounter for screening mammogram for malignant neoplasm of breast: Secondary | ICD-10-CM

## 2023-10-21 ENCOUNTER — Encounter (HOSPITAL_COMMUNITY): Payer: Self-pay

## 2023-10-21 ENCOUNTER — Emergency Department (HOSPITAL_COMMUNITY)
Admission: EM | Admit: 2023-10-21 | Discharge: 2023-10-21 | Disposition: A | Discharge status: Home / Self Care | Payer: Medicare HMO | Attending: Emergency Medicine | Admitting: Emergency Medicine

## 2023-10-21 ENCOUNTER — Emergency Department (HOSPITAL_COMMUNITY): Payer: Medicare HMO

## 2023-10-21 DIAGNOSIS — R42 Dizziness and giddiness: Secondary | ICD-10-CM | POA: Diagnosis not present

## 2023-10-21 DIAGNOSIS — R2689 Other abnormalities of gait and mobility: Secondary | ICD-10-CM | POA: Diagnosis not present

## 2023-10-21 DIAGNOSIS — R531 Weakness: Secondary | ICD-10-CM | POA: Insufficient documentation

## 2023-10-21 DIAGNOSIS — R29818 Other symptoms and signs involving the nervous system: Secondary | ICD-10-CM | POA: Diagnosis not present

## 2023-10-21 DIAGNOSIS — R4182 Altered mental status, unspecified: Secondary | ICD-10-CM | POA: Insufficient documentation

## 2023-10-21 DIAGNOSIS — E039 Hypothyroidism, unspecified: Secondary | ICD-10-CM | POA: Diagnosis not present

## 2023-10-21 DIAGNOSIS — H81399 Other peripheral vertigo, unspecified ear: Secondary | ICD-10-CM

## 2023-10-21 DIAGNOSIS — Z743 Need for continuous supervision: Secondary | ICD-10-CM | POA: Diagnosis not present

## 2023-10-21 LAB — CBG MONITORING, ED: Glucose-Capillary: 102 mg/dL — ABNORMAL HIGH (ref 70–99)

## 2023-10-21 LAB — BASIC METABOLIC PANEL
Anion gap: 7 (ref 5–15)
BUN: 11 mg/dL (ref 8–23)
CO2: 28 mmol/L (ref 22–32)
Calcium: 9 mg/dL (ref 8.9–10.3)
Chloride: 99 mmol/L (ref 98–111)
Creatinine, Ser: 0.57 mg/dL (ref 0.44–1.00)
GFR, Estimated: >60 mL/min (ref >60)
Glucose, Bld: 111 mg/dL — ABNORMAL HIGH (ref 70–99)
Potassium: 4.1 mmol/L (ref 3.5–5.1)
Sodium: 134 mmol/L — ABNORMAL LOW (ref 135–145)

## 2023-10-21 LAB — URINALYSIS, ROUTINE W REFLEX MICROSCOPIC
Bilirubin Urine: NEGATIVE
Glucose, UA: NEGATIVE mg/dL
Hgb urine dipstick: NEGATIVE
Ketones, ur: 5 mg/dL — AB
Leukocytes,Ua: NEGATIVE
Nitrite: NEGATIVE
Protein, ur: NEGATIVE mg/dL
Specific Gravity, Urine: 1.005 (ref 1.005–1.030)
pH: 7 (ref 5.0–8.0)

## 2023-10-21 LAB — CBC WITH DIFFERENTIAL/PLATELET
Abs Immature Granulocytes: 0.02 10*3/uL (ref 0.00–0.07)
Basophils Absolute: 0.1 10*3/uL (ref 0.0–0.1)
Basophils Relative: 1 %
Eosinophils Absolute: 0.2 10*3/uL (ref 0.0–0.5)
Eosinophils Relative: 4 %
HCT: 41.3 % (ref 36.0–46.0)
Hemoglobin: 13.8 g/dL (ref 12.0–15.0)
Immature Granulocytes: 0 %
Lymphocytes Relative: 28 %
Lymphs Abs: 1.4 10*3/uL (ref 0.7–4.0)
MCH: 34.4 pg — ABNORMAL HIGH (ref 26.0–34.0)
MCHC: 33.4 g/dL (ref 30.0–36.0)
MCV: 103 fL — ABNORMAL HIGH (ref 80.0–100.0)
Monocytes Absolute: 0.5 10*3/uL (ref 0.1–1.0)
Monocytes Relative: 9 %
Neutro Abs: 2.9 10*3/uL (ref 1.7–7.7)
Neutrophils Relative %: 58 %
Platelets: 318 10*3/uL (ref 150–400)
RBC: 4.01 MIL/uL (ref 3.87–5.11)
RDW: 13.2 % (ref 11.5–15.5)
WBC: 5.1 10*3/uL (ref 4.0–10.5)
nRBC: 0 % (ref 0.0–0.2)

## 2023-10-21 LAB — PROTIME-INR
INR: 0.9 (ref 0.8–1.2)
Prothrombin Time: 12.7 s (ref 11.4–15.2)

## 2023-10-21 LAB — APTT: aPTT: 34 s (ref 24–36)

## 2023-10-21 MED ORDER — DIAZEPAM 5 MG PO TABS
5.0000 mg | ORAL_TABLET | Freq: Once | ORAL | Status: AC
  Administered 2023-10-21: 5 mg via ORAL
  Filled 2023-10-21: qty 1

## 2023-10-21 MED ORDER — MECLIZINE HCL 25 MG PO TABS
25.0000 mg | ORAL_TABLET | Freq: Once | ORAL | Status: AC
  Administered 2023-10-21: 25 mg via ORAL
  Filled 2023-10-21: qty 1

## 2023-10-21 MED ORDER — SODIUM CHLORIDE 0.9 % IV BOLUS
1000.0000 mL | Freq: Once | INTRAVENOUS | Status: AC
  Administered 2023-10-21: 1000 mL via INTRAVENOUS

## 2023-10-21 MED ORDER — MECLIZINE HCL 25 MG PO TABS: 25.0000 mg | ORAL_TABLET | Freq: Three times a day (TID) | ORAL | 0 refills | Status: DC | PRN

## 2023-10-21 NOTE — ED Provider Notes (Signed)
[place] EMERGENCY DEPARTMENT AT [facility] Provider Note   CSN: [id number] Arrival date & time: 10/21/23  1024     History {Add pertinent medical, surgical, social history, OB history to HPI:1} Chief Complaint  Patient presents with   Dizziness    Cindy Higgins is a 77 y.o. female.  Patient states that she started feeling dizzy and weak around 830 last night.  She went to bed and woke up this morning and still felt dizzy.  Patient has a history of hypothyroidism and glaucoma.  The history is provided by the patient and medical records. No language interpreter was used.  Dizziness Quality:  Head spinning and imbalance Severity:  Moderate Onset quality:  Sudden Timing:  Constant Progression:  Waxing and waning Chronicity:  New Context comment:  Standing up Relieved by:  Nothing Worsened by:  Nothing Associated symptoms: no chest pain, no diarrhea and no headaches        Home Medications Prior to Admission medications   Medication Sig Start Date End Date Taking? Authorizing Provider  ALPRAZolam Prudy Feeler) 0.5 MG tablet TAKE 1 TABLET BY MOUTH 2 TIMES DAILY AS NEEDED. 04/27/23   Corwin Levins, MD  calcium-vitamin D (OSCAL WITH D) 250-125 MG-UNIT tablet Take 1 tablet by mouth daily.    [provider]  citalopram (CELEXA) 40 MG tablet Take 1 tablet (40 mg total) by mouth daily. 04/27/23   Corwin Levins, MD  estradiol (ESTRACE) 0.1 MG/GM vaginal cream Place 1 Applicatorful vaginally at bedtime.    [provider]  estradiol (ESTRACE) 0.5 MG tablet Take 0.5 mg by mouth daily. 11/23/16   [provider]  Evolocumab (REPATHA SURECLICK) 140 MG/ML SOAJ Inject 140 mg into the skin every 14 (fourteen) days. 04/27/23   Corwin Levins, MD  levothyroxine (SYNTHROID) 25 MCG tablet Take 1 tablet (25 mcg total) by mouth daily before breakfast. 04/27/23   Corwin Levins, MD  meloxicam (MOBIC) 15 MG tablet Take 1 tablet (15 mg total) by mouth daily as needed  for pain. 08/15/23   Corwin Levins, MD  timolol (TIMOPTIC) 0.5 % ophthalmic solution 1 drop 2 (two) times daily. 05/12/22   [provider]  vitamin E 45 MG (100 UNITS) capsule Take by mouth daily.    [provider]      Allergies    Sulfa antibiotics, Iodinated contrast media, Iodine, Other, Statins, Zetia [ezetimibe], Codeine, Penicillins, and Shingrix [zoster vac recomb adjuvanted]    Review of Systems   Review of Systems  Constitutional:  Negative for appetite change and fatigue.  HENT:  Negative for congestion, ear discharge and sinus pressure.   Eyes:  Negative for discharge.  Respiratory:  Negative for cough.   Cardiovascular:  Negative for chest pain.  Gastrointestinal:  Negative for abdominal pain and diarrhea.  Genitourinary:  Negative for frequency and hematuria.  Musculoskeletal:  Negative for back pain.  Skin:  Negative for rash.  Neurological:  Positive for dizziness. Negative for seizures and headaches.  Psychiatric/Behavioral:  Negative for hallucinations.     Physical Exam Updated Vital Signs BP 130/65   Pulse 69   Temp 97.9 F (36.6 C) (Oral)   Resp 16   Ht 5\' 4"  (1.626 m)   Wt 60.8 kg   SpO2 100%   BMI 23.00 kg/m  Physical Exam Vitals and nursing note reviewed.  Constitutional:      Appearance: She is well-developed.  HENT:     Head: Normocephalic.  Nose: Nose normal.  Eyes:     General: No scleral icterus.    Conjunctiva/sclera: Conjunctivae normal.     Comments: No nystagmus  Neck:     Thyroid: No thyromegaly.  Cardiovascular:     Rate and Rhythm: Normal rate and regular rhythm.     Heart sounds: No murmur heard.    No friction rub. No gallop.  Pulmonary:     Breath sounds: No stridor. No wheezing or rales.  Chest:     Chest wall: No tenderness.  Abdominal:     General: There is no distension.     Tenderness: There is no abdominal tenderness. There is no rebound.  Musculoskeletal:        General: Normal range of  motion.     Cervical back: Neck supple.  Lymphadenopathy:     Cervical: No cervical adenopathy.  Skin:    Findings: No erythema or rash.  Neurological:     Mental Status: She is alert and oriented to person, place, and time.     Motor: No abnormal muscle tone.     Coordination: Coordination normal.  Psychiatric:        Behavior: Behavior normal.     ED Results / Procedures / Treatments   Labs (all labs ordered are listed, but only abnormal results are displayed) Labs Reviewed  BASIC METABOLIC PANEL - Abnormal; Notable for the following components:      Result Value   Sodium 134 (*)    Glucose, Bld 111 (*)    All other components within normal limits  CBC WITH DIFFERENTIAL/PLATELET - Abnormal; Notable for the following components:   MCV 103.0 (*)    MCH 34.4 (*)    All other components within normal limits  CBG MONITORING, ED - Abnormal; Notable for the following components:   Glucose-Capillary 102 (*)    All other components within normal limits  APTT  PROTIME-INR    EKG None  Radiology CT HEAD WO CONTRAST Result Date: 10/21/2023 CLINICAL DATA:  Mental status change.  Unknown cause. EXAM: CT HEAD WITHOUT CONTRAST TECHNIQUE: Contiguous axial images were obtained from the base of the skull through the vertex without intravenous contrast. RADIATION DOSE REDUCTION: This exam was performed according to the departmental dose-optimization program which includes automated exposure control, adjustment of the mA and/or kV according to patient size and/or use of iterative reconstruction technique. COMPARISON:  MR head without contrast 12/30/2007 FINDINGS: Brain: Mild periventricular white matter hypoattenuation is likely within normal limits for age. No acute infarct, hemorrhage, or mass lesion is present. Deep brain nuclei are within normal limits. No focal cortical abnormalities are present. The ventricles are of normal size. No significant extraaxial fluid collection is present. The  brainstem and cerebellum are within normal limits. Midline structures are within normal limits. Vascular: No hyperdense vessel or unexpected calcification. Skull: Calvarium is intact. No focal lytic or blastic lesions are present. No significant extracranial soft tissue lesion is present. Sinuses/Orbits: The paranasal sinuses and mastoid air cells are clear. Bilateral lens replacements are noted. Globes and orbits are otherwise unremarkable. IMPRESSION: Normal CT appearance of the brain for age. No acute or focal lesion to explain the patient's symptoms. Electronically Signed   By: Marin Roberts M.D.   On: 10/21/2023 11:49    Procedures Procedures  {Document cardiac monitor, telemetry assessment procedure when appropriate:1}  Medications Ordered in ED Medications  diazepam (VALIUM) tablet 5 mg (has no administration in time range)  sodium chloride 0.9 % bolus 1,000  mL (1,000 mLs Intravenous New Bag/Given 10/21/23 1203)  meclizine (ANTIVERT) tablet 25 mg (25 mg Oral Given 10/21/23 1203)    ED Course/ Medical Decision Making/ A&P   {Patient was given normal saline and Antivert and she stated she felt better.  She did not have dizziness laying on the bed.  She had orthostatics done but they did not check orthostatics standing.  I had the patient's stand for me and she started feeling dizzy again with vertigo symptoms.  She will get an MRI of the brain Click here for ABCD2, HEART and other calculatorsREFRESH Note before signing :1}                              Medical Decision Making Risk Prescription drug management.   Patient with dizziness consistent with vertigo that has not improved with fluids and Antivert.  Patient is given Valium and we will get an MRI of the brain {Document critical care time when appropriate:1} {Document review of labs and clinical decision tools ie heart score, Chads2Vasc2 etc:1}  {Document your independent review of radiology images, and any outside  records:1} {Document your discussion with family members, caretakers, and with consultants:1} {Document social determinants of health affecting pt's care:1} {Document your decision making why or why not admission, treatments were needed:1} Final Clinical Impression(s) / ED Diagnoses Final diagnoses:  None    Rx / DC Orders ED Discharge Orders     None

## 2023-10-21 NOTE — ED Notes (Signed)
Provided pt with applesauce & water per nurse approval

## 2023-10-21 NOTE — ED Notes (Signed)
Patient transported to MRI 

## 2023-10-21 NOTE — ED Provider Triage Note (Signed)
Emergency Medicine Provider Triage Evaluation Note  Cindy Higgins , a 77 y.o. female  was evaluated in triage.  Pt complains of dizziness.  Review of Systems  Positive:  Negative:   Physical Exam  BP 139/64 (BP Location: Left Arm)   Pulse 71   Temp 97.9 F (36.6 C) (Oral)   Resp 18   Ht 5\' 4"  (1.626 m)   Wt 60.8 kg   SpO2 99%   BMI 23.00 kg/m  Gen:   Awake, no distress   Resp:  Normal effort  MSK:   Moves extremities without difficulty  Other:    Medical Decision Making  Medically screening exam initiated at 10:36 AM.  Appropriate orders placed.  Cindy Higgins was informed that the remainder of the evaluation will be completed by another provider, this initial triage assessment does not replace that evaluation, and the importance of remaining in the ED until their evaluation is complete.  Concerned for dizziness since 9PM last night. Hx of glaucoma, was using eye drops - when she got up the dizziness started. Went to sleep and slept without problems. Then sat up this morning and started walking to bathroom and felt dizzy again. Endorses room spinning.   Denies fever, chest pain, dyspnea, cough, nausea, vomiting, diarrhea, dysuria, hematuria, hematochezia.    Cindy Higgins, [place] 10/21/23 1039

## 2023-10-21 NOTE — Discharge Instructions (Addendum)
We evaluated you for your dizziness.  Your MRI did not show any signs of a stroke.  We have prescribed you a medication called meclizine which you can take up to 3 times daily as needed for dizziness.  Please follow-up with your primary doctor.  Please return if you have any new or worsening symptoms.

## 2023-10-21 NOTE — ED Triage Notes (Signed)
Pt arrived reporting dizziness x12 hours, states room was spinning. Felt better yesterday, started again today. No other symptoms reports

## 2023-10-21 NOTE — ED Provider Notes (Signed)
   ED Course / MDM   Clinical Course as of 10/21/23 2009  Fri Oct 21, 2023  1404 Received sign out from Dr. Estell Harpin pending MRI. Presenting with dizziness. CT negative.  [WS]  2009 Patient's MRI is negative for stroke.  She feels much better and was able to ambulate in the hallways without difficulty. Will discharge patient to home. All questions answered. Patient comfortable with plan of discharge. Return precautions discussed with patient and specified on the after visit summary.  [WS]    Clinical Course User Index [WS] Lonell Grandchild, MD   Medical Decision Making Amount and/or Complexity of Data Reviewed Labs: ordered. Radiology: ordered.  Risk Prescription drug management.        Lonell Grandchild, MD 10/21/23 2010

## 2023-10-21 NOTE — ED Notes (Signed)
Pt returned to room  

## 2023-10-27 ENCOUNTER — Ambulatory Visit (INDEPENDENT_AMBULATORY_CARE_PROVIDER_SITE_OTHER): Payer: Medicare HMO | Admitting: Internal Medicine

## 2023-10-27 ENCOUNTER — Encounter: Payer: Self-pay | Admitting: Internal Medicine

## 2023-10-27 VITALS — BP 120/70 | HR 75 | Temp 97.8°F | Ht 64.0 in | Wt 136.0 lb

## 2023-10-27 DIAGNOSIS — R7302 Impaired glucose tolerance (oral): Secondary | ICD-10-CM | POA: Diagnosis not present

## 2023-10-27 DIAGNOSIS — R42 Dizziness and giddiness: Secondary | ICD-10-CM | POA: Diagnosis not present

## 2023-10-27 DIAGNOSIS — E538 Deficiency of other specified B group vitamins: Secondary | ICD-10-CM | POA: Diagnosis not present

## 2023-10-27 DIAGNOSIS — I10 Essential (primary) hypertension: Secondary | ICD-10-CM | POA: Diagnosis not present

## 2023-10-27 DIAGNOSIS — E559 Vitamin D deficiency, unspecified: Secondary | ICD-10-CM

## 2023-10-27 MED ORDER — MECLIZINE HCL 25 MG PO TABS: 25.0000 mg | ORAL_TABLET | Freq: Three times a day (TID) | ORAL | 0 refills | Status: DC | PRN

## 2023-10-27 NOTE — Assessment & Plan Note (Signed)
Last vitamin D Lab Results  Component Value Date   VD25OH 59.64 04/27/2023   Stable, cont oral replacement

## 2023-10-27 NOTE — Progress Notes (Signed)
Patient ID: Cindy Higgins, female   DOB: September 12, 1946, 77 y.o.   MRN: [id number]        Chief Complaint: follow up recent dizziness vertigo at ED visit dec 13       HPI:  Cindy Higgins is a 77 y.o. female here with above, tx with meclizine after MRI and CT head negative.  Meclizine has seemed to help.  No other specific ENT symptoms.  Pt denies chest pain, increased sob or doe, wheezing, orthopnea, PND, increased LE swelling, palpitations, or syncope.   Pt denies polydipsia, polyuria, or new focal neuro s/s.    Pt denies fever, wt loss, night sweats, loss of appetite, or other constitutional symptoms         Wt Readings from Last 3 Encounters:  10/27/23 136 lb (61.7 kg)  10/21/23 134 lb (60.8 kg)  08/15/23 134 lb (60.8 kg)   BP Readings from Last 3 Encounters:  10/27/23 120/70  10/21/23 127/71  08/15/23 120/74         Past Medical History:  Diagnosis Date   ALLERGIC RHINITIS 12/22/2007   Anxiety    ANXIETY DEPRESSION 08/30/2007   Arthritis    Left thumb bone on bone   BLURRED VISION, INTERMITTENT 12/22/2007   COLONIC POLYPS, HX OF 12/22/2007   01/18/2006   Glaucoma    NTG Glaucoma   GLUCOSE INTOLERANCE 07/08/2009   Headache(784.0) 12/22/2007   Herpes zoster of eye 05/10/2011   HYPERCHOLESTEROLEMIA 08/30/2007   HYPERLIPIDEMIA 03/26/2008   Hypothyroidism 07/24/2020   Impaired glucose tolerance 05/09/2011   MITRAL VALVE PROLAPSE, HX OF 08/30/2007   OSTEOPOROSIS 12/22/2007   POSTMENOPAUSAL STATUS 08/30/2007   SINUSITIS- ACUTE-NOS 02/06/2010   Past Surgical History:  Procedure Laterality Date   ABDOMINAL HYSTERECTOMY     AUGMENTATION MAMMAPLASTY     COLONOSCOPY     COSMETIC SURGERY     DILATION AND CURETTAGE OF UTERUS  2007   OOPHORECTOMY     POLYPECTOMY      reports that she has never smoked. She has never used smokeless tobacco. She reports current alcohol use of about 2.0 standard drinks of alcohol per week. She reports that she does not use drugs. family history includes Alcohol  abuse in her father; Breast cancer in her maternal grandmother, sister, and sister; Cancer in her sister; Diabetes in her mother; Heart disease in her mother; Hyperlipidemia in her mother; Testicular cancer in her son. Allergies  Allergen Reactions   Sulfa Antibiotics Anaphylaxis   Iodinated Contrast Media Hives   Iodine Other (See Comments)    Burning   Nsaids Other (See Comments)    Petechiae to arms   Other Other (See Comments)   Statins Other (See Comments)   Zetia [Ezetimibe] Diarrhea   Codeine Itching and Rash   Penicillins Itching    Has patient had a PCN reaction causing immediate rash, facial/tongue/throat swelling, SOB or lightheadedness with hypotension:  Unknown think it was rash Has patient had a PCN reaction causing severe rash involving mucus membranes or skin necrosis: unknown childhood Has patient had a PCN reaction that required hospitalization: unknown Has patient had a PCN reaction occurring within the last 10 years: no childhood If all of the above answers are "NO", then may proceed with Cephalosporin use.     Shingrix [Zoster Vac Recomb Adjuvanted] Rash    Severe arm erythema and heat   Current Outpatient Medications on File Prior to Visit  Medication Sig Dispense Refill   ALPRAZolam (XANAX) 0.5 MG  tablet TAKE 1 TABLET BY MOUTH 2 TIMES DAILY AS NEEDED. (Patient taking differently: Take 0.5 mg by mouth daily as needed for anxiety.) 60 tablet 2   calcium-vitamin D (OSCAL WITH D) 250-125 MG-UNIT tablet Take 1 tablet by mouth daily.     citalopram (CELEXA) 40 MG tablet Take 1 tablet (40 mg total) by mouth daily. 90 tablet 3   estradiol (ESTRACE) 0.1 MG/GM vaginal cream Place 1 Applicatorful vaginally at bedtime.     estradiol (ESTRACE) 0.5 MG tablet Take 0.5 mg by mouth daily.     levothyroxine (SYNTHROID) 25 MCG tablet Take 1 tablet (25 mcg total) by mouth daily before breakfast. 90 tablet 3   timolol (TIMOPTIC) 0.5 % ophthalmic solution Place 1 drop into both eyes 2  (two) times daily.     vitamin E 45 MG (100 UNITS) capsule Take 100 Units by mouth daily.     No current facility-administered medications on file prior to visit.        ROS:  All others reviewed and negative.  Objective        PE:  BP 120/70 (BP Location: Right Arm, Patient Position: Sitting, Cuff Size: Normal)   Pulse 75   Temp 97.8 F (36.6 C) (Oral)   Ht 5\' 4"  (1.626 m)   Wt 136 lb (61.7 kg)   SpO2 97%   BMI 23.34 kg/m                 Constitutional: Pt appears in NAD               HENT: Head: NCAT.                Right Ear: External ear normal.                 Left Ear: External ear normal. Left TM mild erythema with trace effusion               Eyes: . Pupils are equal, round, and reactive to light. Conjunctivae and EOM are normal               Nose: without d/c or deformity               Neck: Neck supple. Gross normal ROM               Cardiovascular: Normal rate and regular rhythm.                 Pulmonary/Chest: Effort normal and breath sounds without rales or wheezing.                Abd:  Soft, NT, ND, + BS, no organomegaly               Neurological: Pt is alert. At baseline orientation, motor grossly intact               Skin: Skin is warm. No rashes, no other new lesions, LE edema - none               Psychiatric: Pt behavior is normal without agitation   Micro: none  Cardiac tracings I have personally interpreted today:  none  Pertinent Radiological findings (summarize): none   Lab Results  Component Value Date   WBC 5.1 10/21/2023   HGB 13.8 10/21/2023   HCT 41.3 10/21/2023   PLT 318 10/21/2023   GLUCOSE 111 (H) 10/21/2023   CHOL 248 (H) 04/27/2023   TRIG 203.0 (H) 04/27/2023  HDL 58.30 04/27/2023   LDLDIRECT 175.0 04/27/2023   LDLCALC 152 (H) 04/16/2022   ALT 14 04/27/2023   AST 16 04/27/2023   NA 134 (L) 10/21/2023   K 4.1 10/21/2023   CL 99 10/21/2023   CREATININE 0.57 10/21/2023   BUN 11 10/21/2023   CO2 28 10/21/2023   TSH 2.48  04/27/2023   INR 0.9 10/21/2023   HGBA1C 5.8 04/27/2023   Assessment/Plan:  Cindy Higgins is a 77 y.o. White or Caucasian [1] female with  has a past medical history of ALLERGIC RHINITIS (12/22/2007), Anxiety, ANXIETY DEPRESSION (08/30/2007), Arthritis, BLURRED VISION, INTERMITTENT (12/22/2007), COLONIC POLYPS, HX OF (12/22/2007), Glaucoma, GLUCOSE INTOLERANCE (07/08/2009), Headache(784.0) (12/22/2007), Herpes zoster of eye (05/10/2011), HYPERCHOLESTEROLEMIA (08/30/2007), HYPERLIPIDEMIA (03/26/2008), Hypothyroidism (07/24/2020), Impaired glucose tolerance (05/09/2011), MITRAL VALVE PROLAPSE, HX OF (08/30/2007), OSTEOPOROSIS (12/22/2007), POSTMENOPAUSAL STATUS (08/30/2007), and SINUSITIS- ACUTE-NOS (02/06/2010).  Vertigo Mild to mod, symptomatically improved but still mild persistent, c/w bppv vs other - to continue meclizine prn,  to f/u any worsening symptoms or concerns  Impaired glucose tolerance Lab Results  Component Value Date   HGBA1C 5.8 04/27/2023   Stable, pt to continue current medical treatment  - diet, wt control   Hypertension, uncontrolled BP Readings from Last 3 Encounters:  10/27/23 120/70  10/21/23 127/71  08/15/23 120/74   Stable, pt to continue medical treatment  - diet, wt control   Vitamin d deficiency Last vitamin D Lab Results  Component Value Date   VD25OH 59.64 04/27/2023   Stable, cont oral replacement   B12 deficiency Lab Results  Component Value Date   VITAMINB12 234 04/27/2023   Low, to start oral replacement - b12 1000 mcg qd  Followup: Return if symptoms worsen or fail to improve.  Oliver Barre, MD 10/27/2023 8:00 PM [facility]

## 2023-10-27 NOTE — Patient Instructions (Signed)
Ok to continue the meclizine as needed  Please continue all other medications as before, and refills have been done if requested.  Please have the pharmacy call with any other refills you may need.  Please continue your efforts at being more active, low cholesterol diet, and weight control.  Please keep your appointments with your specialists as you may have planned

## 2023-10-27 NOTE — Assessment & Plan Note (Signed)
Lab Results  Component Value Date   HGBA1C 5.8 04/27/2023   Stable, pt to continue current medical treatment  - diet, wt control  

## 2023-10-27 NOTE — Assessment & Plan Note (Signed)
BP Readings from Last 3 Encounters:  10/27/23 120/70  10/21/23 127/71  08/15/23 120/74   Stable, pt to continue medical treatment  - diet, wt control

## 2023-10-27 NOTE — Assessment & Plan Note (Signed)
Lab Results  Component Value Date   VITAMINB12 234 04/27/2023   Low, to start oral replacement - b12 1000 mcg qd

## 2023-10-27 NOTE — Assessment & Plan Note (Addendum)
Mild to mod, symptomatically improved but still mild persistent, c/w bppv vs other - to continue meclizine prn,  to f/u any worsening symptoms or concerns

## 2023-11-08 ENCOUNTER — Encounter: Payer: Self-pay | Admitting: Internal Medicine

## 2023-11-08 DIAGNOSIS — R42 Dizziness and giddiness: Secondary | ICD-10-CM

## 2023-11-11 ENCOUNTER — Ambulatory Visit (INDEPENDENT_AMBULATORY_CARE_PROVIDER_SITE_OTHER): Payer: Medicare HMO

## 2023-11-11 DIAGNOSIS — Z0000 Encounter for general adult medical examination without abnormal findings: Secondary | ICD-10-CM

## 2023-11-11 NOTE — Patient Instructions (Signed)
 Cindy Higgins , Thank you for taking time to come for your Medicare Wellness Visit. I appreciate your ongoing commitment to your health goals. Please review the following plan we discussed and let me know if I can assist you in the future.   Referrals/Orders/Follow-Ups/Clinician Recommendations: none  This is a list of the screening recommended for you and due dates:  Health Maintenance  Topic Date Due   Colon Cancer Screening  05/19/2023   COVID-19 Vaccine (7 - 2024-25 season) 11/27/2023*   Medicare Annual Wellness Visit  11/10/2024   DTaP/Tdap/Td vaccine (4 - Td or Tdap) 07/24/2030   Pneumonia Vaccine  Completed   Flu Shot  Completed   DEXA scan (bone density measurement)  Completed   Hepatitis C Screening  Completed   HPV Vaccine  Aged Out   Zoster (Shingles) Vaccine  Discontinued  *Topic was postponed. The date shown is not the original due date.    Advanced directives: (Copy Requested) Please bring a copy of your health care power of attorney and living will to the office to be added to your chart at your convenience.  Next Medicare Annual Wellness Visit scheduled for next year: Yes  Insert Preventive Care attachment Insert FALL PREVENTION attachment if needed

## 2023-11-11 NOTE — Progress Notes (Signed)
 Subjective:   Cindy Higgins is a 78 y.o. female who presents for Medicare Annual (Subsequent) preventive examination.  Visit Complete: Virtual I connected with  Reena GORMAN Drown on 11/11/23 by a audio enabled telemedicine application and verified that I am speaking with the correct person using two identifiers.  Interactive audio and video telecommunications were attempted between this provider and patient, however failed, due to patient having technical difficulties OR patient did not have access to video capability.  We continued and completed visit with audio only.   Patient Location: Home  Provider Location: Home Office  I discussed the limitations of evaluation and management by telemedicine. The patient expressed understanding and agreed to proceed.  Vital Signs: Because this visit was a virtual/telehealth visit, some criteria may be missing or patient reported. Any vitals not documented were not able to be obtained and vitals that have been documented are patient reported.    Cardiac Risk Factors include: advanced age (>101men, >34 women);hypertension     Objective:    Today's Vitals   There is no height or weight on file to calculate BMI.     11/11/2023    9:33 AM 10/21/2023   10:42 AM 09/25/2022   11:33 AM 07/14/2022   12:02 PM 07/06/2021    3:59 PM 12/10/2016    6:33 AM  Advanced Directives  Does Patient Have a Medical Advance Directive? Yes No No Yes Yes No  Type of Estate Agent of [place];Living will   Healthcare Power of [place];Living will Healthcare Power of Attorney   Does patient want to make changes to medical advance directive?     No - Patient declined   Copy of Healthcare Power of Attorney in Chart? No - copy requested   No - copy requested    Would patient like information on creating a medical advance directive?  No - Patient declined No - Patient declined   No - Patient declined    Current Medications (verified) Outpatient Encounter  Medications as of 11/11/2023  Medication Sig   ALPRAZolam  (XANAX ) 0.5 MG tablet TAKE 1 TABLET BY MOUTH 2 TIMES DAILY AS NEEDED. (Patient taking differently: Take 0.5 mg by mouth daily as needed for anxiety.)   calcium -vitamin D  (OSCAL WITH D) 250-125 MG-UNIT tablet Take 1 tablet by mouth daily.   citalopram  (CELEXA ) 40 MG tablet Take 1 tablet (40 mg total) by mouth daily.   estradiol (ESTRACE) 0.5 MG tablet Take 0.5 mg by mouth daily.   levothyroxine  (SYNTHROID ) 25 MCG tablet Take 1 tablet (25 mcg total) by mouth daily before breakfast.   meclizine  (ANTIVERT ) 25 MG tablet Take 1 tablet (25 mg total) by mouth 3 (three) times daily as needed for dizziness.   timolol (TIMOPTIC) 0.5 % ophthalmic solution Place 1 drop into both eyes 2 (two) times daily.   estradiol (ESTRACE) 0.1 MG/GM vaginal cream Place 1 Applicatorful vaginally at bedtime. (Patient not taking: Reported on 11/11/2023)   vitamin E 45 MG (100 UNITS) capsule Take 100 Units by mouth daily. (Patient not taking: Reported on 11/11/2023)   No facility-administered encounter medications on file as of 11/11/2023.    Allergies (verified) Sulfa antibiotics, Iodinated contrast media, Iodine, Nsaids, Other, Statins, Zetia  [ezetimibe ], Codeine, Penicillins, and Shingrix [zoster vac recomb adjuvanted]   History: Past Medical History:  Diagnosis Date   ALLERGIC RHINITIS 12/22/2007   Anxiety    ANXIETY DEPRESSION 08/30/2007   Arthritis    Left thumb bone on bone   BLURRED VISION, INTERMITTENT  12/22/2007   COLONIC POLYPS, HX OF 12/22/2007   01/18/2006   Glaucoma    NTG Glaucoma   GLUCOSE INTOLERANCE 07/08/2009   Headache(784.0) 12/22/2007   Herpes zoster of eye 05/10/2011   HYPERCHOLESTEROLEMIA 08/30/2007   HYPERLIPIDEMIA 03/26/2008   Hypothyroidism 07/24/2020   Impaired glucose tolerance 05/09/2011   MITRAL VALVE PROLAPSE, HX OF 08/30/2007   OSTEOPOROSIS 12/22/2007   POSTMENOPAUSAL STATUS 08/30/2007   SINUSITIS- ACUTE-NOS 02/06/2010   Past Surgical  History:  Procedure Laterality Date   ABDOMINAL HYSTERECTOMY     AUGMENTATION MAMMAPLASTY     COLONOSCOPY     COSMETIC SURGERY     DILATION AND CURETTAGE OF UTERUS  2007   OOPHORECTOMY     POLYPECTOMY     Family History  Problem Relation Age of Onset   Heart disease Mother    Diabetes Mother    Hyperlipidemia Mother    Alcohol abuse Father    Breast cancer Sister    Cancer Sister    Breast cancer Sister    Breast cancer Maternal Grandmother    Testicular cancer Son    Social History   Socioeconomic History   Marital status: Married    Spouse name: Not on file   Number of children: 2   Years of education: Not on file   Highest education level: Not on file  Occupational History   Occupation: Retired  Tobacco Use   Smoking status: Never   Smokeless tobacco: Never  Vaping Use   Vaping status: Never Used  Substance and Sexual Activity   Alcohol use: Yes    Alcohol/week: 2.0 standard drinks of alcohol    Types: 2 drink(s) per week    Comment: daily   Drug use: No   Sexual activity: Yes  Other Topics Concern   Not on file  Social History Narrative   Not on file   Social Drivers of Health   Financial Resource Strain: Low Risk  (11/11/2023)   Overall Financial Resource Strain (CARDIA)    Difficulty of Paying Living Expenses: Not hard at all  Food Insecurity: No Food Insecurity (11/11/2023)   Hunger Vital Sign    Worried About Running Out of Food in the Last Year: Never true    Ran Out of Food in the Last Year: Never true  Transportation Needs: No Transportation Needs (11/11/2023)   PRAPARE - Administrator, Civil Service (Medical): No    Lack of Transportation (Non-Medical): No  Physical Activity: Inactive (11/11/2023)   Exercise Vital Sign    Days of Exercise per Week: 0 days    Minutes of Exercise per Session: 0 min  Stress: No Stress Concern Present (11/11/2023)   [employer] of Occupational Health - Occupational Stress Questionnaire    Feeling of  Stress : Not at all  Social Connections: Moderately Integrated (11/11/2023)   Social Connection and Isolation Panel [NHANES]    Frequency of Communication with Friends and Family: More than three times a week    Frequency of Social Gatherings with Friends and Family: Three times a week    Attends Religious Services: Never    Active Member of Clubs or Organizations: Yes    Attends Engineer, Structural: More than 4 times per year    Marital Status: Married    Tobacco Counseling Counseling given: Not Answered   Clinical Intake:  Pre-visit preparation completed: Yes  Pain : No/denies pain     Nutritional Risks: None Diabetes: No  How often do  you need to have someone help you when you read instructions, pamphlets, or other written materials from your doctor or pharmacy?: 1 - Never  Interpreter Needed?: No  Information entered by :: NAllen LPN   Activities of Daily Living    11/11/2023    9:27 AM  In your present state of health, do you have any difficulty performing the following activities:  Hearing? 0  Vision? 0  Difficulty concentrating or making decisions? 0  Walking or climbing stairs? 0  Dressing or bathing? 0  Doing errands, shopping? 0  Preparing Food and eating ? N  Using the Toilet? N  In the past six months, have you accidently leaked urine? N  Do you have problems with loss of bowel control? N  Managing your Medications? N  Managing your Finances? N  Housekeeping or managing your Housekeeping? N    Patient Care Team: Norleen Lynwood ORN, MD as PCP - General (Internal Medicine) Elner Arley LABOR, MD as Consulting Physician (Ophthalmology) Fleeta Zerita DASEN, MD as Consulting Physician (Ophthalmology)  Indicate any recent Medical Services you may have received from other than [facility] providers in the past year (date may be approximate).     Assessment:   This is a routine wellness examination for Cindy Higgins.  Hearing/Vision screen Hearing Screening - Comments::  Denies hearing issues Vision Screening - Comments:: Regular eye exams, Dr. Geneva, [facility]   Goals Addressed             This Visit's Progress    Patient Stated       11/11/2023, wants to get over vertigo       Depression Screen    11/11/2023    9:36 AM 10/27/2023    3:09 PM 08/15/2023    1:45 PM 07/14/2023    1:13 PM 04/27/2023    1:09 PM 10/25/2022    1:11 PM 07/14/2022   11:57 AM  PHQ 2/9 Scores  PHQ - 2 Score 0 0 0 0 0 0 2  PHQ- 9 Score      0     Fall Risk    11/11/2023    9:34 AM 10/27/2023    3:09 PM 08/15/2023    1:45 PM 07/14/2023    1:13 PM 04/27/2023    1:09 PM  Fall Risk   Falls in the past year? 0 0 0 0 0  Number falls in past yr: 0 0 0 0 0  Injury with Fall? 0 0 0 0 0  Risk for fall due to : Medication side effect No Fall Risks No Fall Risks No Fall Risks No Fall Risks  Follow up Falls prevention discussed;Falls evaluation completed Falls evaluation completed Falls evaluation completed Falls evaluation completed Falls evaluation completed    MEDICARE RISK AT HOME: Medicare Risk at Home Any stairs in or around the home?: Yes If so, are there any without handrails?: Yes Home free of loose throw rugs in walkways, pet beds, electrical cords, etc?: Yes Adequate lighting in your home to reduce risk of falls?: Yes Life alert?: No Use of a cane, walker or w/c?: No Grab bars in the bathroom?: No Shower chair or bench in shower?: No Elevated toilet seat or a handicapped toilet?: No  TIMED UP AND GO:  Was the test performed?  No    Cognitive Function:        11/11/2023    9:36 AM 07/14/2022   12:04 PM  6CIT Screen  What Year? 0 points 0 points  What month? 0 points 0 points  What time? 0 points 0 points  Count back from 20 0 points 0 points  Months in reverse 0 points 0 points  Repeat phrase 0 points 0 points  Total Score 0 points 0 points    Immunizations Immunization History  Administered Date(s) Administered   Fluad Quad(high Dose 65+)  07/02/2019, 07/24/2020, 10/20/2021, 07/14/2022   Fluad Trivalent(High Dose 65+) 08/15/2023   Influenza Inj Mdck Quad Pf 09/10/2017   Influenza, High Dose Seasonal PF 12/09/2014, 08/10/2018, 06/28/2019   Influenza,inj,Quad PF,6+ Mos 08/16/2014, 08/21/2015   Influenza-Unspecified 11/09/2015, 08/08/2017   PFIZER(Purple Top)SARS-COV-2 Vaccination 01/14/2020, 02/13/2020, 09/27/2020, 09/15/2021   Pneumococcal Conjugate-13 12/09/2014, 08/21/2015   Pneumococcal Polysaccharide-23 08/20/2014   Td 11/17/2009   Td (Adult), 2 Lf Tetanus Toxid, Preservative Free 11/17/2009   Tdap 07/24/2020   Unspecified SARS-COV-2 Vaccination 01/14/2020, 02/13/2020   Zoster Recombinant(Shingrix) 06/08/2021   Zoster, Live 05/10/2011    TDAP status: Up to date  Flu Vaccine status: Up to date  Pneumococcal vaccine status: Up to date  Covid-19 vaccine status: Information provided on how to obtain vaccines.   Qualifies for Shingles Vaccine? Yes   Zostavax completed Yes   Shingrix Completed?: n/a  Screening Tests Health Maintenance  Topic Date Due   Colonoscopy  05/19/2023   COVID-19 Vaccine (7 - 2024-25 season) 11/27/2023 (Originally 07/10/2023)   Medicare Annual Wellness (AWV)  11/10/2024   DTaP/Tdap/Td (4 - Td or Tdap) 07/24/2030   Pneumonia Vaccine 50+ Years old  Completed   INFLUENZA VACCINE  Completed   DEXA SCAN  Completed   Hepatitis C Screening  Completed   HPV VACCINES  Aged Out   Zoster Vaccines- Shingrix  Discontinued    Health Maintenance  Health Maintenance Due  Topic Date Due   Colonoscopy  05/19/2023    Colorectal cancer screening: No longer required.   Mammogram status: Completed 09/28/2023. Repeat every year  Bone Density status: Completed 10/25/2022.   Lung Cancer Screening: (Low Dose CT Chest recommended if Age 43-80 years, 20 pack-year currently smoking OR have quit w/in 15years.) does not qualify.   Lung Cancer Screening Referral: no  Additional Screening:  Hepatitis C  Screening: does qualify; Completed 11/29/2017  Vision Screening: Recommended annual ophthalmology exams for early detection of glaucoma and other disorders of the eye. Is the patient up to date with their annual eye exam?  Yes  Who is the provider or what is the name of the office in which the patient attends annual eye exams? [facility] If pt is not established with a provider, would they like to be referred to a provider to establish care? No .   Dental Screening: Recommended annual dental exams for proper oral hygiene  Diabetic Foot Exam: n/a  Community Resource Referral / Chronic Care Management: CRR required this visit?  No   CCM required this visit?  No     Plan:     I have personally reviewed and noted the following in the patient's chart:   Medical and social history Use of alcohol, tobacco or illicit drugs  Current medications and supplements including opioid prescriptions. Patient is not currently taking opioid prescriptions. Functional ability and status Nutritional status Physical activity Advanced directives List of other physicians Hospitalizations, surgeries, and ER visits in previous 12 months Vitals Screenings to include cognitive, depression, and falls Referrals and appointments  In addition, I have reviewed and discussed with patient certain preventive protocols, quality metrics, and best practice recommendations. A written personalized  care plan for preventive services as well as general preventive health recommendations were provided to patient.     Ardella FORBES Dawn, LPN   06/13/7973   After Visit Summary: (MyChart) Due to this being a telephonic visit, the after visit summary with patients personalized plan was offered to patient via MyChart   Nurse Notes: none

## 2023-11-16 ENCOUNTER — Other Ambulatory Visit: Payer: Self-pay | Admitting: Internal Medicine

## 2023-12-19 ENCOUNTER — Institutional Professional Consult (permissible substitution) (INDEPENDENT_AMBULATORY_CARE_PROVIDER_SITE_OTHER): Payer: Medicare HMO | Admitting: Otolaryngology

## 2023-12-19 ENCOUNTER — Telehealth: Payer: Self-pay | Admitting: Otolaryngology

## 2023-12-19 NOTE — Telephone Encounter (Signed)
 Called both wife and husband, Left a VM for both

## 2024-01-23 ENCOUNTER — Telehealth (INDEPENDENT_AMBULATORY_CARE_PROVIDER_SITE_OTHER): Payer: Self-pay | Admitting: Otolaryngology

## 2024-01-23 ENCOUNTER — Encounter (INDEPENDENT_AMBULATORY_CARE_PROVIDER_SITE_OTHER): Payer: Self-pay

## 2024-01-23 NOTE — Telephone Encounter (Signed)
 Called patient to reschedule appt on 01/26/2024 with Dr. Irene Pap due to scheduling conflict.  LVM to call our office.

## 2024-01-24 ENCOUNTER — Telehealth (INDEPENDENT_AMBULATORY_CARE_PROVIDER_SITE_OTHER): Payer: Self-pay | Admitting: Otolaryngology

## 2024-01-24 NOTE — Telephone Encounter (Signed)
 Called patient and LVM stating her appt w/ Dr. Irene Pap tomorrow will need to be rescheduled due to a scheduling error.  Requested patient call our office.

## 2024-01-26 ENCOUNTER — Institutional Professional Consult (permissible substitution) (INDEPENDENT_AMBULATORY_CARE_PROVIDER_SITE_OTHER): Payer: Medicare HMO | Admitting: Otolaryngology

## 2024-02-06 DIAGNOSIS — H401233 Low-tension glaucoma, bilateral, severe stage: Secondary | ICD-10-CM | POA: Diagnosis not present

## 2024-02-09 DIAGNOSIS — D1801 Hemangioma of skin and subcutaneous tissue: Secondary | ICD-10-CM | POA: Diagnosis not present

## 2024-02-09 DIAGNOSIS — D692 Other nonthrombocytopenic purpura: Secondary | ICD-10-CM | POA: Diagnosis not present

## 2024-02-09 DIAGNOSIS — L820 Inflamed seborrheic keratosis: Secondary | ICD-10-CM | POA: Diagnosis not present

## 2024-02-09 DIAGNOSIS — L218 Other seborrheic dermatitis: Secondary | ICD-10-CM | POA: Diagnosis not present

## 2024-02-09 DIAGNOSIS — D485 Neoplasm of uncertain behavior of skin: Secondary | ICD-10-CM | POA: Diagnosis not present

## 2024-02-09 DIAGNOSIS — L821 Other seborrheic keratosis: Secondary | ICD-10-CM | POA: Diagnosis not present

## 2024-02-09 DIAGNOSIS — L570 Actinic keratosis: Secondary | ICD-10-CM | POA: Diagnosis not present

## 2024-02-09 DIAGNOSIS — L900 Lichen sclerosus et atrophicus: Secondary | ICD-10-CM | POA: Diagnosis not present

## 2024-03-28 DIAGNOSIS — H401211 Low-tension glaucoma, right eye, mild stage: Secondary | ICD-10-CM | POA: Diagnosis not present

## 2024-03-28 DIAGNOSIS — H0012 Chalazion right lower eyelid: Secondary | ICD-10-CM | POA: Diagnosis not present

## 2024-03-28 DIAGNOSIS — H35371 Puckering of macula, right eye: Secondary | ICD-10-CM | POA: Diagnosis not present

## 2024-03-28 DIAGNOSIS — H401222 Low-tension glaucoma, left eye, moderate stage: Secondary | ICD-10-CM | POA: Diagnosis not present

## 2024-04-27 ENCOUNTER — Encounter: Payer: Self-pay | Admitting: Internal Medicine

## 2024-04-27 ENCOUNTER — Ambulatory Visit: Payer: Self-pay | Admitting: Internal Medicine

## 2024-04-27 ENCOUNTER — Ambulatory Visit (INDEPENDENT_AMBULATORY_CARE_PROVIDER_SITE_OTHER): Payer: Medicare HMO | Admitting: Internal Medicine

## 2024-04-27 ENCOUNTER — Other Ambulatory Visit

## 2024-04-27 VITALS — BP 122/70 | HR 82 | Temp 98.3°F | Ht 64.0 in | Wt 139.0 lb

## 2024-04-27 DIAGNOSIS — E538 Deficiency of other specified B group vitamins: Secondary | ICD-10-CM

## 2024-04-27 DIAGNOSIS — R7302 Impaired glucose tolerance (oral): Secondary | ICD-10-CM | POA: Diagnosis not present

## 2024-04-27 DIAGNOSIS — E559 Vitamin D deficiency, unspecified: Secondary | ICD-10-CM

## 2024-04-27 DIAGNOSIS — F341 Dysthymic disorder: Secondary | ICD-10-CM

## 2024-04-27 DIAGNOSIS — Z0001 Encounter for general adult medical examination with abnormal findings: Secondary | ICD-10-CM

## 2024-04-27 DIAGNOSIS — E7800 Pure hypercholesterolemia, unspecified: Secondary | ICD-10-CM

## 2024-04-27 DIAGNOSIS — I10 Essential (primary) hypertension: Secondary | ICD-10-CM | POA: Diagnosis not present

## 2024-04-27 DIAGNOSIS — Z0000 Encounter for general adult medical examination without abnormal findings: Secondary | ICD-10-CM | POA: Diagnosis not present

## 2024-04-27 DIAGNOSIS — Z1211 Encounter for screening for malignant neoplasm of colon: Secondary | ICD-10-CM

## 2024-04-27 LAB — URINALYSIS, ROUTINE W REFLEX MICROSCOPIC
Bilirubin Urine: NEGATIVE
Hgb urine dipstick: NEGATIVE
Ketones, ur: NEGATIVE
Leukocytes,Ua: NEGATIVE
Nitrite: NEGATIVE
Specific Gravity, Urine: 1.01 (ref 1.000–1.030)
Total Protein, Urine: NEGATIVE
Urine Glucose: NEGATIVE
Urobilinogen, UA: 0.2 (ref 0.0–1.0)
pH: 6 (ref 5.0–8.0)

## 2024-04-27 LAB — BASIC METABOLIC PANEL WITH GFR
BUN: 13 mg/dL (ref 6–23)
CO2: 28 meq/L (ref 19–32)
Calcium: 9.4 mg/dL (ref 8.4–10.5)
Chloride: 100 meq/L (ref 96–112)
Creatinine, Ser: 0.6 mg/dL (ref 0.40–1.20)
GFR: 87.65 mL/min (ref >60.00)
Glucose, Bld: 97 mg/dL (ref 70–99)
Potassium: 4.5 meq/L (ref 3.5–5.1)
Sodium: 137 meq/L (ref 135–145)

## 2024-04-27 LAB — HEPATIC FUNCTION PANEL
ALT: 13 U/L (ref 0–35)
AST: 17 U/L (ref 0–37)
Albumin: 4.1 g/dL (ref 3.5–5.2)
Alkaline Phosphatase: 59 U/L (ref 39–117)
Bilirubin, Direct: 0 mg/dL (ref 0.0–0.3)
Total Bilirubin: 0.3 mg/dL (ref 0.2–1.2)
Total Protein: 7.4 g/dL (ref 6.0–8.3)

## 2024-04-27 LAB — VITAMIN D 25 HYDROXY (VIT D DEFICIENCY, FRACTURES): VITD: 33.47 ng/mL (ref 30.00–100.00)

## 2024-04-27 LAB — CBC WITH DIFFERENTIAL/PLATELET
Basophils Absolute: 0.1 10*3/uL (ref 0.0–0.1)
Basophils Relative: 1.3 % (ref 0.0–3.0)
Eosinophils Absolute: 0.4 10*3/uL (ref 0.0–0.7)
Eosinophils Relative: 5.1 % — ABNORMAL HIGH (ref 0.0–5.0)
HCT: 40.2 % (ref 36.0–46.0)
Hemoglobin: 13.7 g/dL (ref 12.0–15.0)
Lymphocytes Relative: 33 % (ref 12.0–46.0)
Lymphs Abs: 2.3 10*3/uL (ref 0.7–4.0)
MCHC: 34.2 g/dL (ref 30.0–36.0)
MCV: 99.5 fl (ref 78.0–100.0)
Monocytes Absolute: 0.6 10*3/uL (ref 0.1–1.0)
Monocytes Relative: 9.2 % (ref 3.0–12.0)
Neutro Abs: 3.6 10*3/uL (ref 1.4–7.7)
Neutrophils Relative %: 51.4 % (ref 43.0–77.0)
Platelets: 346 10*3/uL (ref 150.0–400.0)
RBC: 4.04 Mil/uL (ref 3.87–5.11)
RDW: 13.3 % (ref 11.5–15.5)
WBC: 7 10*3/uL (ref 4.0–10.5)

## 2024-04-27 LAB — LIPID PANEL
Cholesterol: 229 mg/dL — ABNORMAL HIGH (ref 0–200)
HDL: 62.5 mg/dL (ref >39.00)
LDL Cholesterol: 126 mg/dL — ABNORMAL HIGH (ref 0–99)
NonHDL: 166.69
Total CHOL/HDL Ratio: 4
Triglycerides: 205 mg/dL — ABNORMAL HIGH (ref 0.0–149.0)
VLDL: 41 mg/dL — ABNORMAL HIGH (ref 0.0–40.0)

## 2024-04-27 LAB — VITAMIN B12: Vitamin B-12: 193 pg/mL — ABNORMAL LOW (ref 211–911)

## 2024-04-27 LAB — HEMOGLOBIN A1C: Hgb A1c MFr Bld: 5.8 % (ref 4.6–6.5)

## 2024-04-27 LAB — TSH: TSH: 3.46 u[IU]/mL (ref 0.35–5.50)

## 2024-04-27 MED ORDER — CITALOPRAM HYDROBROMIDE 40 MG PO TABS: 40.0000 mg | ORAL_TABLET | Freq: Every day | ORAL | 3 refills | Status: AC

## 2024-04-27 MED ORDER — ALPRAZOLAM 0.5 MG PO TABS: 0.5000 mg | ORAL_TABLET | Freq: Two times a day (BID) | ORAL | 2 refills | Status: DC | PRN

## 2024-04-27 MED ORDER — BUPROPION HCL ER (XL) 150 MG PO TB24: 150.0000 mg | ORAL_TABLET | Freq: Every day | ORAL | 3 refills | Status: DC

## 2024-04-27 MED ORDER — LEVOTHYROXINE SODIUM 25 MCG PO TABS: 25.0000 ug | ORAL_TABLET | Freq: Every day | ORAL | 3 refills | Status: AC

## 2024-04-27 NOTE — Progress Notes (Unsigned)
 Patient ID: Cindy Higgins, female   DOB: October 02, 1946, 78 y.o.   MRN: [id number]         Chief Complaint:: wellness exam and        HPI:  Cindy Higgins is a 78 y.o. female here for wellness exam                        Also getting more frail she believes.  Had had mild worsening depressive symptoms, suicidal ideation, or panic; has ongoing anxiety.  Had PT eval and epley maneuver and resolved.  Was not able to see ENT.   Son with legal issue over embezzlement.  Has clogged right lower lid oil glands, to see optho soon to f/u after doxycycline course.     Wt Readings from Last 3 Encounters:  04/27/24 139 lb (63 kg)  10/27/23 136 lb (61.7 kg)  10/21/23 134 lb (60.8 kg)   BP Readings from Last 3 Encounters:  04/27/24 122/70  10/27/23 120/70  10/21/23 127/71   Immunization History  Administered Date(s) Administered   Fluad Quad(high Dose 65+) 07/02/2019, 07/24/2020, 10/20/2021, 07/14/2022   Fluad Trivalent(High Dose 65+) 08/15/2023   Influenza Inj Mdck Quad Pf 09/10/2017   Influenza, High Dose Seasonal PF 12/09/2014, 08/10/2018, 06/28/2019   Influenza,inj,Quad PF,6+ Mos 08/16/2014, 08/21/2015   Influenza-Unspecified 11/09/2015, 08/08/2017   PFIZER(Purple Top)SARS-COV-2 Vaccination 01/14/2020, 02/13/2020, 09/27/2020, 09/15/2021   Pneumococcal Conjugate-13 12/09/2014, 08/21/2015   Pneumococcal Polysaccharide-23 08/20/2014   Td 11/17/2009   Td (Adult), 2 Lf Tetanus Toxid, Preservative Free 11/17/2009   Tdap 07/24/2020   Unspecified SARS-COV-2 Vaccination 01/14/2020, 02/13/2020   Zoster Recombinant(Shingrix) 06/08/2021   Zoster, Live 05/10/2011   Health Maintenance Due  Topic Date Due   Colonoscopy  05/19/2023   COVID-19 Vaccine (7 - 2024-25 season) 07/10/2023      Past Medical History:  Diagnosis Date   ALLERGIC RHINITIS 12/22/2007   Anxiety    ANXIETY DEPRESSION 08/30/2007   Arthritis    Left thumb bone on bone   BLURRED VISION, INTERMITTENT 12/22/2007   COLONIC POLYPS, HX  OF 12/22/2007   01/18/2006   Glaucoma    NTG Glaucoma   GLUCOSE INTOLERANCE 07/08/2009   Headache(784.0) 12/22/2007   Herpes zoster of eye 05/10/2011   HYPERCHOLESTEROLEMIA 08/30/2007   HYPERLIPIDEMIA 03/26/2008   Hypothyroidism 07/24/2020   Impaired glucose tolerance 05/09/2011   MITRAL VALVE PROLAPSE, HX OF 08/30/2007   OSTEOPOROSIS 12/22/2007   POSTMENOPAUSAL STATUS 08/30/2007   SINUSITIS- ACUTE-NOS 02/06/2010   Past Surgical History:  Procedure Laterality Date   ABDOMINAL HYSTERECTOMY     AUGMENTATION MAMMAPLASTY     COLONOSCOPY     COSMETIC SURGERY     DILATION AND CURETTAGE OF UTERUS  2007   OOPHORECTOMY     POLYPECTOMY      reports that she has never smoked. She has never used smokeless tobacco. She reports current alcohol use of about 2.0 standard drinks of alcohol per week. She reports that she does not use drugs. family history includes Alcohol abuse in her father; Breast cancer in her maternal grandmother, sister, and sister; Cancer in her sister; Diabetes in her mother; Heart disease in her mother; Hyperlipidemia in her mother; Testicular cancer in her son. Allergies  Allergen Reactions   Sulfa Antibiotics Anaphylaxis   Iodinated Contrast Media Hives   Iodine Other (See Comments)    Burning   Nsaids Other (See Comments)    Petechiae to arms   Other Other (See Comments)  Statins Other (See Comments)   Zetia  [Ezetimibe ] Diarrhea   Codeine Itching and Rash   Penicillins Itching    Has patient had a PCN reaction causing immediate rash, facial/tongue/throat swelling, SOB or lightheadedness with hypotension:  Unknown think it was rash Has patient had a PCN reaction causing severe rash involving mucus membranes or skin necrosis: unknown childhood Has patient had a PCN reaction that required hospitalization: unknown Has patient had a PCN reaction occurring within the last 10 years: no childhood If all of the above answers are NO, then may proceed with Cephalosporin use.      Shingrix [Zoster Vac Recomb Adjuvanted] Rash    Severe arm erythema and heat   Current Outpatient Medications on File Prior to Visit  Medication Sig Dispense Refill   ALPRAZolam  (XANAX ) 0.5 MG tablet TAKE 1 TABLET BY MOUTH TWICE A DAY AS NEEDED 60 tablet 2   calcium -vitamin D  (OSCAL WITH D) 250-125 MG-UNIT tablet Take 1 tablet by mouth daily.     citalopram  (CELEXA ) 40 MG tablet Take 1 tablet (40 mg total) by mouth daily. 90 tablet 3   doxycycline (VIBRA-TABS) 100 MG tablet Take by mouth.     estradiol (ESTRACE) 0.1 MG/GM vaginal cream Place 1 Applicatorful vaginally at bedtime.     estradiol (ESTRACE) 0.5 MG tablet Take 0.5 mg by mouth daily.     levothyroxine  (SYNTHROID ) 25 MCG tablet Take 1 tablet (25 mcg total) by mouth daily before breakfast. 90 tablet 3   meclizine  (ANTIVERT ) 25 MG tablet Take 1 tablet (25 mg total) by mouth 3 (three) times daily as needed for dizziness. 60 tablet 0   tacrolimus (PROTOPIC) 0.1 % ointment Apply topically 2 (two) times daily.     timolol (TIMOPTIC) 0.5 % ophthalmic solution Place 1 drop into both eyes 2 (two) times daily.     vitamin E 45 MG (100 UNITS) capsule Take 100 Units by mouth daily.     No current facility-administered medications on file prior to visit.        ROS:  All others reviewed and negative.  Objective        PE:  BP 122/70 (BP Location: Right Arm, Patient Position: Sitting, Cuff Size: Normal)   Pulse 82   Temp 98.3 F (36.8 C) (Oral)   Ht 5' 4 (1.626 m)   Wt 139 lb (63 kg)   SpO2 97%   BMI 23.86 kg/m                 Constitutional: Pt appears in NAD               HENT: Head: NCAT.                Right Ear: External ear normal.                 Left Ear: External ear normal.                Eyes: . Pupils are equal, round, and reactive to light. Conjunctivae and EOM are normal               Nose: without d/c or deformity               Neck: Neck supple. Gross normal ROM               Cardiovascular: Normal rate and regular  rhythm.  Pulmonary/Chest: Effort normal and breath sounds without rales or wheezing.                Abd:  Soft, NT, ND, + BS, no organomegaly               Neurological: Pt is alert. At baseline orientation, motor grossly intact               Skin: Skin is warm. No rashes, no other new lesions, LE edema - ***               Psychiatric: Pt behavior is normal without agitation   Micro: none  Cardiac tracings I have personally interpreted today:  none  Pertinent Radiological findings (summarize): none   Lab Results  Component Value Date   WBC 5.1 10/21/2023   HGB 13.8 10/21/2023   HCT 41.3 10/21/2023   PLT 318 10/21/2023   GLUCOSE 111 (H) 10/21/2023   CHOL 248 (H) 04/27/2023   TRIG 203.0 (H) 04/27/2023   HDL 58.30 04/27/2023   LDLDIRECT 175.0 04/27/2023   LDLCALC 152 (H) 04/16/2022   ALT 14 04/27/2023   AST 16 04/27/2023   NA 134 (L) 10/21/2023   K 4.1 10/21/2023   CL 99 10/21/2023   CREATININE 0.57 10/21/2023   BUN 11 10/21/2023   CO2 28 10/21/2023   TSH 2.48 04/27/2023   INR 0.9 10/21/2023   HGBA1C 5.8 04/27/2023   Assessment/Plan:  RONNITA PAZ is a 78 y.o. White or Caucasian [1] female with  has a past medical history of ALLERGIC RHINITIS (12/22/2007), Anxiety, ANXIETY DEPRESSION (08/30/2007), Arthritis, BLURRED VISION, INTERMITTENT (12/22/2007), COLONIC POLYPS, HX OF (12/22/2007), Glaucoma, GLUCOSE INTOLERANCE (07/08/2009), Headache(784.0) (12/22/2007), Herpes zoster of eye (05/10/2011), HYPERCHOLESTEROLEMIA (08/30/2007), HYPERLIPIDEMIA (03/26/2008), Hypothyroidism (07/24/2020), Impaired glucose tolerance (05/09/2011), MITRAL VALVE PROLAPSE, HX OF (08/30/2007), OSTEOPOROSIS (12/22/2007), POSTMENOPAUSAL STATUS (08/30/2007), and SINUSITIS- ACUTE-NOS (02/06/2010).  No problem-specific Assessment & Plan notes found for this encounter.  Followup: No follow-ups on file.  Rosalia Colonel, MD 04/27/2024 1:24 PM [facility]

## 2024-04-27 NOTE — Patient Instructions (Signed)
 Please take all new medication as prescribed - the wellbutrin  xl 150 mg per day  Please continue all other medications as before, and refills have been done if requested.  Please have the pharmacy call with any other refills you may need.  Please continue your efforts at being more active, low cholesterol diet, and weight control.  You are otherwise up to date with prevention measures today.  Please keep your appointments with your specialists as you may have planned  You will be contacted regarding the referral for: Counseling, and Colonoscopy  Please go to the LAB at the blood drawing area for the tests to be done  You will be contacted by phone if any changes need to be made immediately.  Otherwise, you will receive a letter about your results with an explanation, but please check with MyChart first.  Please make an Appointment to return for your 1 year visit, or sooner if needed, with Lab testing by Appointment as well, to be done about 3-5 days before at the FIRST FLOOR Lab (so this is for TWO appointments - please see the scheduling desk as you leave)

## 2024-04-29 ENCOUNTER — Encounter: Payer: Self-pay | Admitting: Internal Medicine

## 2024-04-29 NOTE — Assessment & Plan Note (Signed)
 Lab Results  Component Value Date   VITAMINB12 193 (L) 04/27/2024   Low, to start oral replacement - b12 1000 mcg qd

## 2024-04-29 NOTE — Assessment & Plan Note (Signed)
 BP Readings from Last 3 Encounters:  04/27/24 122/70  10/27/23 120/70  10/21/23 127/71   Stable, pt to continue medical treatment  - diet,wt control

## 2024-04-29 NOTE — Assessment & Plan Note (Signed)
 Last vitamin D  Lab Results  Component Value Date   VD25OH 33.47 04/27/2024   Low, to start oral replacement

## 2024-04-29 NOTE — Assessment & Plan Note (Signed)
 Lab Results  Component Value Date   HGBA1C 5.8 04/27/2024   Stable, pt to continue current medical treatment  - diet, wt control

## 2024-04-29 NOTE — Assessment & Plan Note (Signed)
 Lab Results  Component Value Date   LDLCALC 126 (H) 04/27/2024   Uncontrolled, for lower chol diet, consider statin but declines for now

## 2024-04-29 NOTE — Assessment & Plan Note (Signed)
 Age and sex appropriate education and counseling updated with regular exercise and diet Referrals for preventative services - for colonscopy Immunizations addressed - none needed Smoking counseling  - none needed Evidence for depression or other mood disorder - worsening, for referral counseling, continue celexa , and start wellbutrin  xl 150 qd Most recent labs reviewed. I have personally reviewed and have noted: 1) the patient's medical and social history 2) The patient's current medications and supplements 3) The patient's height, weight, and BMI have been recorded in the chart

## 2024-04-29 NOTE — Assessment & Plan Note (Signed)
 With recent worsening but no SI, ok for referral counseling, and add wellbutrin  xl 150 every day, cont celexa 

## 2024-06-14 DIAGNOSIS — Z01419 Encounter for gynecological examination (general) (routine) without abnormal findings: Secondary | ICD-10-CM | POA: Diagnosis not present

## 2024-07-13 ENCOUNTER — Encounter: Payer: Self-pay | Admitting: Internal Medicine

## 2024-07-16 ENCOUNTER — Encounter: Payer: Self-pay | Admitting: Gastroenterology

## 2024-07-16 DIAGNOSIS — H401233 Low-tension glaucoma, bilateral, severe stage: Secondary | ICD-10-CM | POA: Diagnosis not present

## 2024-07-26 ENCOUNTER — Ambulatory Visit (AMBULATORY_SURGERY_CENTER)

## 2024-07-26 ENCOUNTER — Encounter: Payer: Self-pay | Admitting: Gastroenterology

## 2024-07-26 VITALS — Ht 64.0 in | Wt 138.4 lb

## 2024-07-26 DIAGNOSIS — Z1211 Encounter for screening for malignant neoplasm of colon: Secondary | ICD-10-CM

## 2024-07-26 MED ORDER — NA SULFATE-K SULFATE-MG SULF 17.5-3.13-1.6 GM/177ML PO SOLN: 1.0000 | Freq: Once | ORAL | 0 refills | Status: AC

## 2024-07-26 NOTE — Progress Notes (Signed)
 No egg or soy allergy known to patient  No issues known to pt with past sedation with any surgeries or procedures Patient denies ever being told they had issues or difficulty with intubation  No FH of Malignant Hyperthermia Pt is not on diet pills Pt is not on  home 02  Pt is not on blood thinners  constipation with intermit diarrhea  No A fib or A flutter Have any cardiac testing pending-- no  LOA: independent  Prep: suprep    PV completed with patient. Prep instructions sent via mychart and home address.

## 2024-08-09 ENCOUNTER — Encounter: Payer: Self-pay | Admitting: Gastroenterology

## 2024-08-09 ENCOUNTER — Other Ambulatory Visit: Payer: Self-pay

## 2024-08-09 ENCOUNTER — Ambulatory Visit: Admitting: Gastroenterology

## 2024-08-09 VITALS — BP 129/69 | HR 70 | Temp 97.8°F | Ht 64.0 in | Wt 138.4 lb

## 2024-08-09 DIAGNOSIS — Z1211 Encounter for screening for malignant neoplasm of colon: Secondary | ICD-10-CM

## 2024-08-09 MED ORDER — SODIUM CHLORIDE 0.9 % IV SOLN: 500.0000 mL | Freq: Once | INTRAVENOUS | Status: DC

## 2024-08-09 NOTE — Progress Notes (Signed)
 Pt states that she is a difficult airway---told by anesthesia and sent letter by anesthesia to wear a bracelet so people will know if something happens that they would be aware. MD made aware and case canceled.

## 2024-08-09 NOTE — Progress Notes (Signed)
 Lesion on right eye since last pre-visit on 07/26/2024.

## 2024-08-09 NOTE — Progress Notes (Signed)
 Sent message to office nurse to assist with rescheduling colonoscopy at the hospital for a hx of difficult intubation.

## 2024-08-13 ENCOUNTER — Telehealth: Payer: Self-pay

## 2024-08-13 NOTE — Telephone Encounter (Signed)
 Left vm for pt. Procedure time would be 0730 with a 0615 arrival time.

## 2024-08-13 NOTE — Telephone Encounter (Signed)
 Patient returning call to confirm apt. Would need to know time. Please advise.

## 2024-08-13 NOTE — Telephone Encounter (Signed)
 Attempted to call patient to see if she would like to reschedule colonoscopy to 08/20/2024. No answer. Lvm for pt to return call.

## 2024-08-13 NOTE — Telephone Encounter (Signed)
-----   Message from Victory LITTIE Brand III sent at 08/13/2024  3:46 PM EDT ----- Regarding: Possibility of moving up her procedure Cindy Higgins,  Since a colonoscopy at [facility] for 08/20/2024 needs to be canceled due to that patient's pending cardiac workup, please contact this patient and see if she would be willing to move up to next week.  She is currently on my schedule for a hospital case on 09/24/2024, having been moved from the [facility] due to a history of difficult intubation.  When I saw her in the [facility], she indicated the desire to get the procedure done as soon as possible.  Thank you  H Danis

## 2024-08-14 NOTE — Telephone Encounter (Signed)
 Spoke with pt. Discussed new procedure date and arrival time. Pt verbalized understanding of all instructions.

## 2024-08-14 NOTE — Telephone Encounter (Signed)
 Attempted to reach patient. No answer, left VM for patient to return call.

## 2024-08-14 NOTE — Telephone Encounter (Signed)
 Patient returning call Requesting a call back  Please advise  Thank you

## 2024-08-14 NOTE — Telephone Encounter (Signed)
 Correction to previous note arrival time would be 0600. Spoke with scheduling this am. Pt successfully scheduled for 0730 on 08/20/24. New instructions sent via my chart.   Attempted to call pt to discuss change in schedule. No answer, left vm for pt to return call.

## 2024-08-16 ENCOUNTER — Other Ambulatory Visit: Payer: Self-pay

## 2024-08-16 ENCOUNTER — Encounter (HOSPITAL_COMMUNITY): Payer: Self-pay | Admitting: Gastroenterology

## 2024-08-16 MED ORDER — NA SULFATE-K SULFATE-MG SULF 17.5-3.13-1.6 GM/177ML PO SOLN: 1.0000 | Freq: Once | ORAL | 0 refills | Status: AC

## 2024-08-16 NOTE — Progress Notes (Signed)
 Attempted to obtain medical history for pre op call via telephone, unable to reach at this time. HIPAA compliant voicemail message left requesting return call to pre surgical testing department.

## 2024-08-19 NOTE — Anesthesia Preprocedure Evaluation (Signed)
 Anesthesia Evaluation  Patient identified by MRN, date of birth, ID band Patient awake    Reviewed: Allergy & Precautions, NPO status , Patient's Chart, lab work & pertinent test results  Airway Mallampati: IV  TM Distance: >3 FB Neck ROM: Full    Dental  (+) Dental Advisory Given   Pulmonary shortness of breath   Pulmonary exam normal breath sounds clear to auscultation       Cardiovascular hypertension, Normal cardiovascular exam Rhythm:Regular Rate:Normal     Neuro/Psych  Headaches PSYCHIATRIC DISORDERS Anxiety Depression     Neuromuscular disease    GI/Hepatic negative GI ROS, Neg liver ROS,,,  Endo/Other  Hypothyroidism    Renal/GU negative Renal ROS     Musculoskeletal  (+) Arthritis ,    Abdominal   Peds  Hematology negative hematology ROS (+)   Anesthesia Other Findings   Reproductive/Obstetrics                              Anesthesia Physical Anesthesia Plan  ASA: 2  Anesthesia Plan: MAC   Post-op Pain Management: Minimal or no pain anticipated   Induction: Intravenous  PONV Risk Score and Plan: 2 and Propofol infusion, TIVA and Treatment may vary due to age or medical condition  Airway Management Planned:   Additional Equipment:   Intra-op Plan:   Post-operative Plan:   Informed Consent: I have reviewed the patients History and Physical, chart, labs and discussed the procedure including the risks, benefits and alternatives for the proposed anesthesia with the patient or authorized representative who has indicated his/her understanding and acceptance.     Dental advisory given  Plan Discussed with: CRNA  Anesthesia Plan Comments:          Anesthesia Quick Evaluation

## 2024-08-20 ENCOUNTER — Encounter (HOSPITAL_COMMUNITY)
Admission: RE | Disposition: A | Discharge status: Home / Self Care | Payer: Self-pay | Source: Home / Self Care | Attending: Gastroenterology

## 2024-08-20 ENCOUNTER — Ambulatory Visit (HOSPITAL_COMMUNITY): Payer: Self-pay | Admitting: Anesthesiology

## 2024-08-20 ENCOUNTER — Encounter (HOSPITAL_COMMUNITY): Payer: Self-pay | Admitting: Gastroenterology

## 2024-08-20 ENCOUNTER — Other Ambulatory Visit: Payer: Self-pay

## 2024-08-20 ENCOUNTER — Ambulatory Visit (HOSPITAL_COMMUNITY)
Admission: RE | Admit: 2024-08-20 | Discharge: 2024-08-20 | Disposition: A | Discharge status: Home / Self Care | Attending: Gastroenterology | Admitting: Gastroenterology

## 2024-08-20 DIAGNOSIS — K5900 Constipation, unspecified: Secondary | ICD-10-CM | POA: Diagnosis not present

## 2024-08-20 DIAGNOSIS — D122 Benign neoplasm of ascending colon: Secondary | ICD-10-CM | POA: Diagnosis not present

## 2024-08-20 DIAGNOSIS — I10 Essential (primary) hypertension: Secondary | ICD-10-CM

## 2024-08-20 DIAGNOSIS — D120 Benign neoplasm of cecum: Secondary | ICD-10-CM

## 2024-08-20 DIAGNOSIS — F419 Anxiety disorder, unspecified: Secondary | ICD-10-CM | POA: Insufficient documentation

## 2024-08-20 DIAGNOSIS — Z1211 Encounter for screening for malignant neoplasm of colon: Secondary | ICD-10-CM

## 2024-08-20 DIAGNOSIS — R197 Diarrhea, unspecified: Secondary | ICD-10-CM | POA: Diagnosis not present

## 2024-08-20 DIAGNOSIS — F418 Other specified anxiety disorders: Secondary | ICD-10-CM

## 2024-08-20 DIAGNOSIS — K635 Polyp of colon: Secondary | ICD-10-CM

## 2024-08-20 DIAGNOSIS — M810 Age-related osteoporosis without current pathological fracture: Secondary | ICD-10-CM | POA: Diagnosis not present

## 2024-08-20 DIAGNOSIS — F32A Depression, unspecified: Secondary | ICD-10-CM | POA: Insufficient documentation

## 2024-08-20 DIAGNOSIS — E039 Hypothyroidism, unspecified: Secondary | ICD-10-CM | POA: Insufficient documentation

## 2024-08-20 DIAGNOSIS — Q438 Other specified congenital malformations of intestine: Secondary | ICD-10-CM

## 2024-08-20 HISTORY — PX: COLONOSCOPY: SHX5424

## 2024-08-20 SURGERY — COLONOSCOPY
Anesthesia: Monitor Anesthesia Care

## 2024-08-20 MED ORDER — GLYCOPYRROLATE 0.2 MG/ML IJ SOLN
INTRAMUSCULAR | Status: DC | PRN
  Administered 2024-08-20: .1 mg via INTRAVENOUS

## 2024-08-20 MED ORDER — PROPOFOL 1000 MG/100ML IV EMUL
INTRAVENOUS | Status: AC
  Filled 2024-08-20: qty 100

## 2024-08-20 MED ORDER — SODIUM CHLORIDE 0.9 % IV SOLN: INTRAVENOUS | Status: DC

## 2024-08-20 MED ORDER — EPHEDRINE SULFATE-NACL 50-0.9 MG/10ML-% IV SOSY
PREFILLED_SYRINGE | INTRAVENOUS | Status: DC | PRN
  Administered 2024-08-20: 10 mg via INTRAVENOUS
  Administered 2024-08-20: 5 mg via INTRAVENOUS

## 2024-08-20 MED ORDER — DEXMEDETOMIDINE HCL IN NACL 80 MCG/20ML IV SOLN
INTRAVENOUS | Status: AC
  Filled 2024-08-20: qty 20

## 2024-08-20 MED ORDER — LACTATED RINGERS IV SOLN: INTRAVENOUS | Status: DC | PRN

## 2024-08-20 MED ORDER — PROPOFOL 500 MG/50ML IV EMUL
INTRAVENOUS | Status: AC
  Filled 2024-08-20: qty 100

## 2024-08-20 MED ORDER — PROPOFOL 10 MG/ML IV BOLUS
INTRAVENOUS | Status: DC | PRN
  Administered 2024-08-20: 40 mg via INTRAVENOUS
  Administered 2024-08-20: 100 ug/kg/min via INTRAVENOUS

## 2024-08-20 MED ORDER — LIDOCAINE 2% (20 MG/ML) 5 ML SYRINGE
INTRAMUSCULAR | Status: DC | PRN
  Administered 2024-08-20: 80 mg via INTRAVENOUS

## 2024-08-20 MED ORDER — PROPOFOL 10 MG/ML IV BOLUS
INTRAVENOUS | Status: AC
Start: 2024-08-20 — End: 2024-08-20
  Filled 2024-08-20: qty 20

## 2024-08-20 NOTE — H&P (Signed)
 History and Physical:  This patient presents for endoscopic testing for: Colon cancer screening  78 year old woman here today for screening colonoscopy.  Her last colonoscopy had no polyps in July 2014. Incidentally, she notes a couple of months of intermittent diarrhea with loose or frequent stool alternating with constipation.  No new medications other than estradiol.  Denies rectal bleeding or family history of colorectal cancer.  Patient is otherwise without complaints or active issues today.   Past Medical History: Past Medical History:  Diagnosis Date   ALLERGIC RHINITIS 12/22/2007   Anxiety    ANXIETY DEPRESSION 08/30/2007   Arthritis    Left thumb bone on bone   BLURRED VISION, INTERMITTENT 12/22/2007   COLONIC POLYPS, HX OF 12/22/2007   01/18/2006   Difficult airway for intubation    Pt states anesthesia told her and sent a letter that she was a difficult airway after her surgey 10 years ago.   Glaucoma    NTG Glaucoma   GLUCOSE INTOLERANCE 07/08/2009   Headache(784.0) 12/22/2007   Herpes zoster of eye 05/10/2011   HYPERCHOLESTEROLEMIA 08/30/2007   HYPERLIPIDEMIA 03/26/2008   Hypothyroidism 07/24/2020   Impaired glucose tolerance 05/09/2011   MITRAL VALVE PROLAPSE, HX OF 08/30/2007   OSTEOPOROSIS 12/22/2007   POSTMENOPAUSAL STATUS 08/30/2007   SINUSITIS- ACUTE-NOS 02/06/2010     Past Surgical History: Past Surgical History:  Procedure Laterality Date   ABDOMINAL HYSTERECTOMY     AUGMENTATION MAMMAPLASTY     COLONOSCOPY     COSMETIC SURGERY     DILATION AND CURETTAGE OF UTERUS  2007   OOPHORECTOMY     POLYPECTOMY      Allergies: Allergies  Allergen Reactions   Sulfa Antibiotics Anaphylaxis   Codeine Itching and Rash   Iodinated Contrast Media Hives   Iodine Other (See Comments)    Burning   Nsaids Other (See Comments)    Petechiae to arms   Other Other (See Comments)    STEROIDS- contraindication per patient   Penicillins Itching    Has patient  had a PCN reaction causing immediate rash, facial/tongue/throat swelling, SOB or lightheadedness with hypotension:  Unknown think it was rash Has patient had a PCN reaction causing severe rash involving mucus membranes or skin necrosis: unknown childhood Has patient had a PCN reaction that required hospitalization: unknown Has patient had a PCN reaction occurring within the last 10 years: no childhood If all of the above answers are NO, then may proceed with Cephalosporin use.     Shingrix [Zoster Vac Recomb Adjuvanted] Rash    Severe arm erythema and heat   Statins Other (See Comments)   Zetia  [Ezetimibe ] Diarrhea    Outpatient Meds: Current Facility-Administered Medications  Medication Dose Route Frequency Provider Last Rate Last Admin   0.9 %  sodium chloride  infusion   Intravenous Continuous Danis, Victory CROME III, MD          ___________________________________________________________________ Objective   Exam:  BP (!) 146/65   Pulse 81   Temp (!) 97.5 F (36.4 C) (Temporal)   Resp 20   Ht 5' 4 (1.626 m)   Wt 62.8 kg   SpO2 95%   BMI 23.76 kg/m   CV: regular , S1/S2 Resp: clear to auscultation bilaterally, normal RR and effort noted GI: soft, no tenderness, with active bowel sounds.   Assessment: Average risk for colorectal cancer   Plan: Colonoscopy (Will also take biopsies to rule out microscopic colitis given her reported change in bowel habits)  The benefits and  risks of the planned procedure(s) were described in detail with the patient or (when appropriate) their health care proxy.  Risks were outlined as including, but not limited to, bleeding, infection, perforation, adverse medication reaction leading to cardiac or pulmonary decompensation, pancreatitis (if ERCP).  The limitation of incomplete mucosal visualization was also discussed.  No guarantees or warranties were given.  The patient is appropriate for an endoscopic procedure in the ambulatory  setting.   - Victory Brand, MD

## 2024-08-20 NOTE — Op Note (Signed)
 [facility] Patient Name: Cindy Higgins Procedure Date: 08/20/2024 MRN: [id number] Attending MD: Victory CROME. Legrand , MD, [id number] Date of Birth: October 04, 1946 CSN: [id number] Age: 78 Admit Type: Outpatient Procedure:                Colonoscopy Indications:              Screening for colorectal malignant neoplasm,                            Incidental diarrhea noted (about 2                            months-alternating diarrhea and constipation)                           No polyps last colonoscopy July 2014 Providers:                Victory CROME. Legrand, MD, Collene Edu, RN, Curtistine Bishop, Technician Referring MD:             Lynwood MICAEL Rush, MD Medicines:                Monitored Anesthesia Care Complications:            No immediate complications. Estimated Blood Loss:     Estimated blood loss was minimal. Procedure:                Pre-Anesthesia Assessment:                           - Prior to the procedure, a History and Physical                            was performed, and patient medications and                            allergies were reviewed. The patient's tolerance of                            previous anesthesia was also reviewed. The risks                            and benefits of the procedure and the sedation                            options and risks were discussed with the patient.                            All questions were answered, and informed consent                            was obtained. Prior Anticoagulants: The patient has                            taken  no anticoagulant or antiplatelet agents. ASA                            Grade Assessment: II - A patient with mild systemic                            disease. After reviewing the risks and benefits,                            the patient was deemed in satisfactory condition to                            undergo the procedure.                           After obtaining  informed consent, the colonoscope                            was passed under direct vision. Throughout the                            procedure, the patient's blood pressure, pulse, and                            oxygen saturations were monitored continuously. The                            [id number]-[id number] ([id number]) Olympus colonscope was                            introduced through the anus and advanced to the the                            cecum, identified by appendiceal orifice and                            ileocecal valve. The colonoscopy was somewhat                            difficult due to a redundant colon. Successful                            completion of the procedure was aided by changing                            the patient to a semi-prone position, using manual                            pressure and straightening and shortening the scope                            to obtain bowel loop reduction. The patient  tolerated the procedure well. The quality of the                            bowel preparation was excellent. The ileocecal                            valve, appendiceal orifice, and rectum were                            photographed. (TI could not be intubated due to                            scope looping) Scope In: 7:38:54 AM Scope Out: 7:57:12 AM Scope Withdrawal Time: 0 hours 11 minutes 10 seconds  Total Procedure Duration: 0 hours 18 minutes 18 seconds  Findings:      The perianal and digital rectal examinations were normal.      Two mucous-capped and semi-sessile polyps were found in the ascending       colon and cecum. The polyps were 5 mm in size. These polyps were removed       with a cold snare. Resection and retrieval were complete.      Normal mucosa was found in the entire colon. Biopsies for histology were       taken with a cold forceps from the right colon and left colon for       evaluation of microscopic colitis.       The exam was otherwise without abnormality on direct and retroflexion       views. Impression:               - Two 5 mm polyps in the ascending colon and in the                            cecum, removed with a cold snare. Resected and                            retrieved.                           - Normal mucosa in the entire examined colon.                            Biopsied.                           - The examination was otherwise normal on direct                            and retroflexion views. Moderate Sedation:      MAC sedation used Recommendation:           - Patient has a contact number available for                            emergencies. The signs and symptoms of potential  delayed complications were discussed with the                            patient. Return to normal activities tomorrow.                            Written discharge instructions were provided to the                            patient.                           - Resume previous diet.                           - Continue present medications.                           - Await pathology results.                           - No repeat routine surveillance colonoscopy                            recommended due to age and current guidelines. Procedure Code(s):        --- Professional ---                           [phone], Colonoscopy, flexible; with removal of                            tumor(s), polyp(s), or other lesion(s) by snare                            technique                           45380, 59, Colonoscopy, flexible; with biopsy,                            single or multiple Diagnosis Code(s):        --- Professional ---                           Z12.11, Encounter for screening for malignant                            neoplasm of colon                           D12.2, Benign neoplasm of ascending colon                           D12.0, Benign neoplasm of cecum CPT  copyright 2022 American Medical Association. All rights reserved. The codes documented in this report are preliminary and upon coder review may  be revised to meet current compliance requirements. Warrick Llera L. Legrand, MD 08/20/2024 8:06:32 AM  This report has been signed electronically. Number of Addenda: 0

## 2024-08-20 NOTE — Discharge Instructions (Signed)

## 2024-08-20 NOTE — Interval H&P Note (Signed)
 History and Physical Interval Note:  08/20/2024 7:28 AM  Cindy Higgins  has presented today for surgery, with the diagnosis of Z12.11.  The various methods of treatment have been discussed with the patient and family. After consideration of risks, benefits and other options for treatment, the patient has consented to  Procedure(s): COLONOSCOPY (N/A) as a surgical intervention.  The patient's history has been reviewed, patient examined, no change in status, stable for surgery.  I have reviewed the patient's chart and labs.  Questions were answered to the patient's satisfaction.     Victory LITTIE Brand III

## 2024-08-20 NOTE — Transfer of Care (Signed)
 Immediate Anesthesia Transfer of Care Note  Patient: Cindy Higgins  Procedure(s) Performed: Procedure(s): COLONOSCOPY (N/A)  Patient Location: PACU and Endoscopy Unit  Anesthesia Type:MAC  Level of Consciousness: awake, alert  and oriented  Airway & Oxygen Therapy: Patient Spontanous Breathing and Patient connected to nasal cannula oxygen  Post-op Assessment: Report given to RN and Post -op Vital signs reviewed and stable  Post vital signs: Reviewed and stable  Last Vitals:  Vitals:   08/20/24 0647  BP: (!) 146/65  Pulse: 81  Resp: 20  Temp: (!) 36.4 C  SpO2: 95%    Complications: No apparent anesthesia complications

## 2024-08-20 NOTE — Anesthesia Postprocedure Evaluation (Signed)
 Anesthesia Post Note  Patient: Cindy Higgins  Procedure(s) Performed: COLONOSCOPY     Patient location during evaluation: PACU Anesthesia Type: MAC Level of consciousness: awake and alert Pain management: pain level controlled Vital Signs Assessment: post-procedure vital signs reviewed and stable Respiratory status: spontaneous breathing Cardiovascular status: stable Anesthetic complications: no   No notable events documented.  Last Vitals:  Vitals:   08/20/24 0830 08/20/24 0840  BP: (!) 108/46 (!) 112/51  Pulse: 72 72  Resp: 16 16  Temp:    SpO2: 99% 93%    Last Pain:  Vitals:   08/20/24 0840  TempSrc:   PainSc: 0-No pain                 Norleen Pope

## 2024-08-21 ENCOUNTER — Encounter (HOSPITAL_COMMUNITY): Payer: Self-pay | Admitting: Gastroenterology

## 2024-08-21 ENCOUNTER — Ambulatory Visit: Payer: Self-pay | Admitting: Gastroenterology

## 2024-08-21 LAB — SURGICAL PATHOLOGY

## 2024-08-21 NOTE — Telephone Encounter (Signed)
 Letter mailed

## 2024-09-04 DIAGNOSIS — H11441 Conjunctival cysts, right eye: Secondary | ICD-10-CM | POA: Diagnosis not present

## 2024-09-04 DIAGNOSIS — H10829 Rosacea conjunctivitis, unspecified eye: Secondary | ICD-10-CM | POA: Diagnosis not present

## 2024-09-04 DIAGNOSIS — H029 Unspecified disorder of eyelid: Secondary | ICD-10-CM | POA: Diagnosis not present

## 2024-10-08 NOTE — Progress Notes (Deleted)
 Chief Complaint: Follow-up after colonoscopy  HPI:    Cindy Higgins is a 78 year old female with a past medical history as listed below including colon polyps and difficult airway, known to Dr. Legrand, who presents to clinic today for follow-up after recent colonoscopy.    04/29/2024 CBC, hepatic function panel, BMP, hemoglobin A1c and TSH all normal.  B12 normal.    08/20/2024 colonoscopy at [facility], at that time incidental diarrhea noted for 2 months alternating diarrhea and constipation with two 5 mm polyps in the ascending colon and cecum and otherwise normal mucosa.  No repeat recommended due to age.  Pathology was negative for microscopic colitis.  Sessile serrated adenoma in the ascending colon.  Past Medical History:  Diagnosis Date   ALLERGIC RHINITIS 12/22/2007   Anxiety    ANXIETY DEPRESSION 08/30/2007   Arthritis    Left thumb bone on bone   BLURRED VISION, INTERMITTENT 12/22/2007   COLONIC POLYPS, HX OF 12/22/2007   01/18/2006   Difficult airway for intubation    Pt states anesthesia told her and sent a letter that she was a difficult airway after her surgey 10 years ago.   Glaucoma    NTG Glaucoma   GLUCOSE INTOLERANCE 07/08/2009   Headache(784.0) 12/22/2007   Herpes zoster of eye 05/10/2011   HYPERCHOLESTEROLEMIA 08/30/2007   HYPERLIPIDEMIA 03/26/2008   Hypothyroidism 07/24/2020   Impaired glucose tolerance 05/09/2011   MITRAL VALVE PROLAPSE, HX OF 08/30/2007   OSTEOPOROSIS 12/22/2007   POSTMENOPAUSAL STATUS 08/30/2007   SINUSITIS- ACUTE-NOS 02/06/2010    Past Surgical History:  Procedure Laterality Date   ABDOMINAL HYSTERECTOMY     AUGMENTATION MAMMAPLASTY     COLONOSCOPY     COLONOSCOPY N/A 08/20/2024   Procedure: COLONOSCOPY;  Surgeon: Legrand Victory LITTIE DOUGLAS, MD;  Location: [facility];  Service: Gastroenterology;  Laterality: N/A;   COSMETIC SURGERY     DILATION AND CURETTAGE OF UTERUS  2007   OOPHORECTOMY     POLYPECTOMY      Current  Outpatient Medications  Medication Sig Dispense Refill   ALPRAZolam  (XANAX ) 0.5 MG tablet Take 1 tablet (0.5 mg total) by mouth 2 (two) times daily as needed. 60 tablet 2   buPROPion  (WELLBUTRIN  XL) 150 MG 24 hr tablet Take 1 tablet (150 mg total) by mouth daily. (Patient not taking: No sig reported) 90 tablet 3   calcium -vitamin D  (OSCAL WITH D) 250-125 MG-UNIT tablet Take 1 tablet by mouth daily.     citalopram  (CELEXA ) 40 MG tablet Take 1 tablet (40 mg total) by mouth daily. 90 tablet 3   doxycycline (VIBRA-TABS) 100 MG tablet Take by mouth. (Patient not taking: No sig reported)     estradiol (ESTRACE) 0.1 MG/GM vaginal cream Place 1 Applicatorful vaginally at bedtime.     estradiol (ESTRACE) 0.5 MG tablet Take 0.5 mg by mouth daily.     levothyroxine  (SYNTHROID ) 25 MCG tablet Take 1 tablet (25 mcg total) by mouth daily before breakfast. 90 tablet 3   meclizine  (ANTIVERT ) 25 MG tablet Take 1 tablet (25 mg total) by mouth 3 (three) times daily as needed for dizziness. (Patient not taking: No sig reported) 60 tablet 0   tacrolimus (PROTOPIC) 0.1 % ointment Apply topically 2 (two) times daily. (Patient not taking: No sig reported)     timolol (TIMOPTIC) 0.5 % ophthalmic solution Place 1 drop into both eyes 2 (two) times daily.     vitamin E 45 MG (100 UNITS) capsule Take 100 Units by mouth  daily. (Patient not taking: No sig reported)     Current Facility-Administered Medications  Medication Dose Route Frequency Provider Last Rate Last Admin   0.9 %  sodium chloride  infusion  500 mL Intravenous Once Danis, Henry L III, MD        Allergies as of 10/09/2024 - Review Complete 08/20/2024  Allergen Reaction Noted   Sulfa antibiotics Anaphylaxis 12/10/2016   Codeine Itching and Rash 04/07/2013   Iodinated contrast media Hives 12/01/2022   Iodine Other (See Comments) 07/22/2021   Nsaids Other (See Comments) 10/27/2023   Other Other (See Comments) 04/20/2021   Penicillins Itching 05/10/2011    Shingrix [zoster vac recomb adjuvanted] Rash 10/20/2021   Statins Other (See Comments) 04/22/2022   Zetia  [ezetimibe ] Diarrhea 04/22/2022    Family History  Problem Relation Age of Onset   Heart disease Mother    Diabetes Mother    Hyperlipidemia Mother    Alcohol abuse Father    Breast cancer Sister    Cancer Sister    Breast cancer Sister    Breast cancer Maternal Grandmother    Testicular cancer Son    Colon cancer Neg Hx    Rectal cancer Neg Hx    Stomach cancer Neg Hx     Social History   Socioeconomic History   Marital status: Married    Spouse name: Not on file   Number of children: 2   Years of education: Not on file   Highest education level: Not on file  Occupational History   Occupation: Retired  Tobacco Use   Smoking status: Never   Smokeless tobacco: Never  Vaping Use   Vaping status: Never Used  Substance and Sexual Activity   Alcohol use: Not Currently    Alcohol/week: 2.0 standard drinks of alcohol    Types: 2 drink(s) per week    Comment: daily   Drug use: No   Sexual activity: Yes  Other Topics Concern   Not on file  Social History Narrative   Not on file   Social Drivers of Health   Financial Resource Strain: Low Risk  (11/11/2023)   Overall Financial Resource Strain (CARDIA)    Difficulty of Paying Living Expenses: Not hard at all  Food Insecurity: No Food Insecurity (11/11/2023)   Hunger Vital Sign    Worried About Radiation Protection Practitioner of Food in the Last Year: Never true    Ran Out of Food in the Last Year: Never true  Transportation Needs: No Transportation Needs (11/11/2023)   PRAPARE - Administrator, Civil Service (Medical): No    Lack of Transportation (Non-Medical): No  Physical Activity: Inactive (11/11/2023)   Exercise Vital Sign    Days of Exercise per Week: 0 days    Minutes of Exercise per Session: 0 min  Stress: No Stress Concern Present (11/11/2023)   [employer] of Occupational Health - Occupational Stress  Questionnaire    Feeling of Stress : Not at all  Social Connections: Moderately Integrated (11/11/2023)   Social Connection and Isolation Panel    Frequency of Communication with Friends and Family: More than three times a week    Frequency of Social Gatherings with Friends and Family: Three times a week    Attends Religious Services: Never    Active Member of Clubs or Organizations: Yes    Attends Banker Meetings: More than 4 times per year    Marital Status: Married  Catering Manager Violence: Not At Risk (11/11/2023)  Humiliation, Afraid, Rape, and Kick questionnaire    Fear of Current or Ex-Partner: No    Emotionally Abused: No    Physically Abused: No    Sexually Abused: No    Review of Systems:    Constitutional: No weight loss, fever, chills, weakness or fatigue HEENT: Eyes: No change in vision               Ears, Nose, Throat:  No change in hearing or congestion Skin: No rash or itching Cardiovascular: No chest pain, chest pressure or palpitations   Respiratory: No SOB or cough Gastrointestinal: See HPI and otherwise negative Genitourinary: No dysuria or change in urinary frequency Neurological: No headache, dizziness or syncope Musculoskeletal: No new muscle or joint pain Hematologic: No bleeding or bruising Psychiatric: No history of depression or anxiety    Physical Exam:  Vital signs: There were no vitals taken for this visit.  Constitutional:   Pleasant Caucasian female appears to be in NAD, Well developed, Well nourished, alert and cooperative Head:  Normocephalic and atraumatic. Eyes:   PEERL, EOMI. No icterus. Conjunctiva pink. Ears:  Normal auditory acuity. Neck:  Supple Throat: Oral cavity and pharynx without inflammation, swelling or lesion.  Respiratory: Respirations even and unlabored. Lungs clear to auscultation bilaterally.   No wheezes, crackles, or rhonchi.  Cardiovascular: Normal S1, S2. No MRG. Regular rate and rhythm. No peripheral  edema, cyanosis or pallor.  Gastrointestinal:  Soft, nondistended, nontender. No rebound or guarding. Normal bowel sounds. No appreciable masses or hepatomegaly. Rectal:  Not performed.  Msk:  Symmetrical without gross deformities. Without edema, no deformity or joint abnormality.  Neurologic:  Alert and  oriented x4;  grossly normal neurologically.  Skin:   Dry and intact without significant lesions or rashes. Psychiatric: Oriented to person, place and time. Demonstrates good judgement and reason without abnormal affect or behaviors.  RELEVANT LABS AND IMAGING: CBC    Component Value Date/Time   WBC 7.0 04/27/2024 1411   RBC 4.04 04/27/2024 1411   HGB 13.7 04/27/2024 1411   HCT 40.2 04/27/2024 1411   PLT 346.0 04/27/2024 1411   MCV 99.5 04/27/2024 1411   MCH 34.4 (H) 10/21/2023 1100   MCHC 34.2 04/27/2024 1411   RDW 13.3 04/27/2024 1411   LYMPHSABS 2.3 04/27/2024 1411   MONOABS 0.6 04/27/2024 1411   EOSABS 0.4 04/27/2024 1411   BASOSABS 0.1 04/27/2024 1411    CMP     Component Value Date/Time   NA 137 04/27/2024 1411   K 4.5 04/27/2024 1411   CL 100 04/27/2024 1411   CO2 28 04/27/2024 1411   GLUCOSE 97 04/27/2024 1411   BUN 13 04/27/2024 1411   CREATININE 0.60 04/27/2024 1411   CALCIUM  9.4 04/27/2024 1411   CALCIUM  10.2 05/10/2012 0917   PROT 7.4 04/27/2024 1411   ALBUMIN 4.1 04/27/2024 1411   AST 17 04/27/2024 1411   ALT 13 04/27/2024 1411   ALKPHOS 59 04/27/2024 1411   BILITOT 0.3 04/27/2024 1411   GFRNONAA >60 10/21/2023 1100   GFRAA >90 04/07/2013 0908    Assessment: 1.  Change in bowel habits: Recent colonoscopy in October with biopsies taken for reported diarrhea negative for microscopic colitis  Plan: 1. ***     Delon Failing, PA-C [facility] 10/08/2024, 2:14 PM  Cc: Norleen Lynwood ORN, MD

## 2024-10-09 ENCOUNTER — Ambulatory Visit: Admitting: Physician Assistant

## 2024-11-12 ENCOUNTER — Ambulatory Visit (INDEPENDENT_AMBULATORY_CARE_PROVIDER_SITE_OTHER): Payer: Medicare HMO

## 2024-11-12 VITALS — Ht 64.0 in | Wt 138.0 lb

## 2024-11-12 DIAGNOSIS — Z0000 Encounter for general adult medical examination without abnormal findings: Secondary | ICD-10-CM

## 2024-11-12 NOTE — Patient Instructions (Addendum)
 Cindy Higgins,  Thank you for taking the time for your Medicare Wellness Visit. I appreciate your continued commitment to your health goals. Please review the care plan we discussed, and feel free to reach out if I can assist you further.  Please note that Annual Wellness Visits do not include a physical exam. Some assessments may be limited, especially if the visit was conducted virtually. If needed, we may recommend an in-person follow-up with your provider.  Ongoing Care Seeing your primary care provider every 3 to 6 months helps us  monitor your health and provide consistent, personalized care. Last office visit on 04/27/2024.   You are due for a flu vaccine and can get that done at your local pharmacy.  Remember to listen out via Text, email and or phone call in regards to receiving some counseling.    Referrals If a referral was made during today's visit and you haven't received any updates within two weeks, please contact the referred provider directly to check on the status.  Recommended Screenings:  Health Maintenance  Topic Date Due   Flu Shot  06/08/2024   Medicare Annual Wellness Visit  11/10/2024   DTaP/Tdap/Td vaccine (4 - Td or Tdap) 07/24/2030   Pneumococcal Vaccine for age over 22  Completed   Osteoporosis screening with Bone Density Scan  Completed   Hepatitis C Screening  Completed   Meningitis B Vaccine  Aged Out   Breast Cancer Screening  Discontinued   Colon Cancer Screening  Discontinued   COVID-19 Vaccine  Discontinued   Zoster (Shingles) Vaccine  Discontinued       11/12/2024   10:27 AM  Advanced Directives  Does Patient Have a Medical Advance Directive? Yes  Type of Advance Directive Healthcare Power of Attorney  Copy of Healthcare Power of Attorney in Chart? No - copy requested    Vision: Annual vision screenings are recommended for early detection of glaucoma, cataracts, and diabetic retinopathy. These exams can also reveal signs of chronic conditions such as  diabetes and high blood pressure.  Dental: Annual dental screenings help detect early signs of oral cancer, gum disease, and other conditions linked to overall health, including heart disease and diabetes.  Please see the attached documents for additional preventive care recommendations.

## 2024-11-12 NOTE — Progress Notes (Signed)
 "  Chief Complaint  Patient presents with   Medicare Wellness     Subjective:   Cindy Higgins is a 79 y.o. female who presents for a Medicare Annual Wellness Visit.  Visit info / Clinical Intake: Medicare Wellness Visit Type:: Subsequent Annual Wellness Visit Persons participating in visit and providing information:: patient Medicare Wellness Visit Mode:: Telephone If telephone:: video declined Since this visit was completed virtually, some vitals may be partially provided or unavailable. Missing vitals are due to the limitations of the virtual format.: Documented vitals are patient reported If Telephone or Video please confirm:: I connected with patient using audio/video enable telemedicine. I verified patient identity with two identifiers, discussed telehealth limitations, and patient agreed to proceed. Patient Location:: home Provider Location:: office Interpreter Needed?: No Pre-visit prep was completed: yes AWV questionnaire completed by patient prior to visit?: no Living arrangements:: lives with spouse/significant other Patient's Overall Health Status Rating: good Typical amount of pain: none Does pain affect daily life?: no Are you currently prescribed opioids?: no  Dietary Habits and Nutritional Risks How many meals a day?: 2 Eats fruit and vegetables daily?: yes Most meals are obtained by: preparing own meals In the last 2 weeks, have you had any of the following?: none Diabetic:: no  Functional Status Activities of Daily Living (to include ambulation/medication): Independent Ambulation: Independent with device- listed below Home Assistive Devices/Equipment: Eyeglasses Medication Administration: Independent Home Management (perform basic housework or laundry): Independent Manage your own finances?: yes Primary transportation is: driving Concerns about vision?: no *vision screening is required for WTM* Concerns about hearing?: no  Fall Screening Falls in the  past year?: 1 Number of falls in past year: 0 (missed a step) Was there an injury with Fall?: 0 Fall Risk Category Calculator: 1 Patient Fall Risk Level: Low Fall Risk  Fall Risk Patient at Risk for Falls Due to: Impaired balance/gait Fall risk Follow up: Falls evaluation completed; Falls prevention discussed  Home and Transportation Safety: All rugs have non-skid backing?: N/A, no rugs All stairs or steps have railings?: yes (above garage living) Grab bars in the bathtub or shower?: (!) no Have non-skid surface in bathtub or shower?: yes Good home lighting?: yes Regular seat belt use?: yes Hospital stays in the last year:: no  Cognitive Assessment Difficulty concentrating, remembering, or making decisions? : yes Will 6CIT or Mini Cog be Completed: yes What year is it?: 0 points What month is it?: 0 points Give patient an address phrase to remember (5 components): [address], [place] About what time is it?: 0 points Count backwards from 20 to 1: 0 points Say the months of the year in reverse: 2 points (may) Repeat the address phrase from earlier: 0 points 6 CIT Score: 2 points  Advance Directives (For Healthcare) Does Patient Have a Medical Advance Directive?: Yes Type of Advance Directive: Healthcare Power of Attorney Copy of Healthcare Power of Attorney in Chart?: No - copy requested Would patient like information on creating a medical advance directive?: No - Patient declined  Reviewed/Updated  Reviewed/Updated: Reviewed All (Medical, Surgical, Family, Medications, Allergies, Care Teams, Patient Goals)    Allergies (verified) Sulfa antibiotics, Codeine, Iodinated contrast media, Iodine, Nsaids, Other, Penicillins, Shingrix [zoster vac recomb adjuvanted], Statins, and Zetia  [ezetimibe ]   Current Medications (verified) Outpatient Encounter Medications as of 11/12/2024  Medication Sig   ALPRAZolam  (XANAX ) 0.5 MG tablet Take 1 tablet (0.5 mg total) by mouth 2 (two)  times daily as needed.   calcium -vitamin D  (  OSCAL WITH D) 250-125 MG-UNIT tablet Take 1 tablet by mouth daily.   citalopram  (CELEXA ) 40 MG tablet Take 1 tablet (40 mg total) by mouth daily.   cyanocobalamin  (VITAMIN B12) 1000 MCG tablet Take 1,000 mcg by mouth daily.   estradiol (ESTRACE) 0.1 MG/GM vaginal cream Place 1 Applicatorful vaginally at bedtime.   estradiol (ESTRACE) 0.5 MG tablet Take 0.5 mg by mouth daily.   levothyroxine  (SYNTHROID ) 25 MCG tablet Take 1 tablet (25 mcg total) by mouth daily before breakfast.   timolol (TIMOPTIC) 0.5 % ophthalmic solution Place 1 drop into both eyes 2 (two) times daily.   vitamin E 45 MG (100 UNITS) capsule Take 100 Units by mouth daily.   buPROPion  (WELLBUTRIN  XL) 150 MG 24 hr tablet Take 1 tablet (150 mg total) by mouth daily. (Patient not taking: Reported on 11/12/2024)   doxycycline (VIBRA-TABS) 100 MG tablet Take by mouth. (Patient not taking: Reported on 11/12/2024)   meclizine  (ANTIVERT ) 25 MG tablet Take 1 tablet (25 mg total) by mouth 3 (three) times daily as needed for dizziness. (Patient not taking: Reported on 11/12/2024)   tacrolimus (PROTOPIC) 0.1 % ointment Apply topically 2 (two) times daily. (Patient not taking: Reported on 11/12/2024)   Facility-Administered Encounter Medications as of 11/12/2024  Medication   0.9 %  sodium chloride  infusion    History: Past Medical History:  Diagnosis Date   ALLERGIC RHINITIS 12/22/2007   Anxiety    ANXIETY DEPRESSION 08/30/2007   Arthritis    Left thumb bone on bone   BLURRED VISION, INTERMITTENT 12/22/2007   COLONIC POLYPS, HX OF 12/22/2007   01/18/2006   Difficult airway for intubation    Pt states anesthesia told her and sent a letter that she was a difficult airway after her surgey 10 years ago.   Glaucoma    NTG Glaucoma   GLUCOSE INTOLERANCE 07/08/2009   Headache(784.0) 12/22/2007   Herpes zoster of eye 05/10/2011   HYPERCHOLESTEROLEMIA 08/30/2007   HYPERLIPIDEMIA 03/26/2008    Hypothyroidism 07/24/2020   Impaired glucose tolerance 05/09/2011   MITRAL VALVE PROLAPSE, HX OF 08/30/2007   OSTEOPOROSIS 12/22/2007   POSTMENOPAUSAL STATUS 08/30/2007   SINUSITIS- ACUTE-NOS 02/06/2010   Past Surgical History:  Procedure Laterality Date   ABDOMINAL HYSTERECTOMY     AUGMENTATION MAMMAPLASTY     COLONOSCOPY     COLONOSCOPY N/A 08/20/2024   Procedure: COLONOSCOPY;  Surgeon: Legrand Victory LITTIE DOUGLAS, MD;  Location: [facility];  Service: Gastroenterology;  Laterality: N/A;   COSMETIC SURGERY     DILATION AND CURETTAGE OF UTERUS  2007   OOPHORECTOMY     POLYPECTOMY     Family History  Problem Relation Age of Onset   Heart disease Mother    Diabetes Mother    Hyperlipidemia Mother    Alcohol abuse Father    Breast cancer Sister    Cancer Sister    Breast cancer Sister    Breast cancer Maternal Grandmother    Testicular cancer Son    Colon cancer Neg Hx    Rectal cancer Neg Hx    Stomach cancer Neg Hx    Social History   Occupational History   Occupation: Retired  Tobacco Use   Smoking status: Never   Smokeless tobacco: Never  Vaping Use   Vaping status: Never Used  Substance and Sexual Activity   Alcohol use: Not Currently    Alcohol/week: 2.0 standard drinks of alcohol    Types: 2 drink(s) per week    Comment: daily  Drug use: No   Sexual activity: Yes   Tobacco Counseling Counseling given: Not Answered  SDOH Screenings   Food Insecurity: No Food Insecurity (11/12/2024)  Housing: Unknown (11/12/2024)  Transportation Needs: No Transportation Needs (11/12/2024)  Utilities: Not At Risk (11/12/2024)  Alcohol Screen: Low Risk (11/11/2023)  Depression (PHQ2-9): Medium Risk (11/12/2024)  Financial Resource Strain: Low Risk (11/11/2023)  Physical Activity: Inactive (11/12/2024)  Social Connections: Moderately Integrated (11/12/2024)  Stress: Stress Concern Present (11/12/2024)  Tobacco Use: Low Risk (11/12/2024)  Health Literacy: Adequate Health Literacy (11/12/2024)    See flowsheets for full screening details  Depression Screen PHQ 2 & 9 Depression Scale- Over the past 2 weeks, how often have you been bothered by any of the following problems? Little interest or pleasure in doing things: 3 Feeling down, depressed, or hopeless (PHQ Adolescent also includes...irritable): 3 PHQ-2 Total Score: 6 Trouble falling or staying asleep, or sleeping too much: 0 Feeling tired or having little energy: 3 (little energy) Poor appetite or overeating (PHQ Adolescent also includes...weight loss): 0 Feeling bad about yourself - or that you are a failure or have let yourself or your family down: 1 Trouble concentrating on things, such as reading the newspaper or watching television (PHQ Adolescent also includes...like school work): 0 Moving or speaking so slowly that other people could have noticed. Or the opposite - being so fidgety or restless that you have been moving around a lot more than usual: 0 Thoughts that you would be better off dead, or of hurting yourself in some way: 0 PHQ-9 Total Score: 10 If you checked off any problems, how difficult have these problems made it for you to do your work, take care of things at home, or get along with other people?: Somewhat difficult  Depression Treatment Depression Interventions/Treatment : Medication (per pt- medication not helping)     Goals Addressed             This Visit's Progress    Patient Stated   On track    11/11/2023, wants to get over vertigo             Objective:    Today's Vitals   11/12/24 1018  Weight: 138 lb (62.6 kg)  Height: 5' 4 (1.626 m)   Body mass index is 23.69 kg/m.  Hearing/Vision screen Hearing Screening - Comments:: Denies hearing difficulties   Vision Screening - Comments:: Wears eyeglasses/ Dr. Elayne Immunizations and Health Maintenance Health Maintenance  Topic Date Due   Influenza Vaccine  06/08/2024   Medicare Annual Wellness (AWV)  11/12/2025    DTaP/Tdap/Td (4 - Td or Tdap) 07/24/2030   Pneumococcal Vaccine: 50+ Years  Completed   Bone Density Scan  Completed   Hepatitis C Screening  Completed   Meningococcal B Vaccine  Aged Out   Mammogram  Discontinued   Colonoscopy  Discontinued   COVID-19 Vaccine  Discontinued   Zoster Vaccines- Shingrix  Discontinued        Assessment/Plan:  This is a routine wellness examination for Cindy Higgins.  Patient Care Team: Norleen Lynwood ORN, MD as PCP - General (Internal Medicine) Elner Arley LABOR, MD as Consulting Physician (Ophthalmology) Fleeta Zerita DASEN, MD as Consulting Physician (Ophthalmology) Bond, Reyes Mcardle, MD as Referring Physician (Ophthalmology)  I have personally reviewed and noted the following in the patients chart:   Medical and social history Use of alcohol, tobacco or illicit drugs  Current medications and supplements including opioid prescriptions. Functional ability and status Nutritional status Physical activity  Advanced directives List of other physicians Hospitalizations, surgeries, and ER visits in previous 12 months Vitals Screenings to include cognitive, depression, and falls Referrals and appointments  No orders of the defined types were placed in this encounter.  In addition, I have reviewed and discussed with patient certain preventive protocols, quality metrics, and best practice recommendations. A written personalized care plan for preventive services as well as general preventive health recommendations were provided to patient.   Luismanuel Corman L Ronell Boldin, CMA   11/12/2024   Return in 1 year (on 11/12/2025).  After Visit Summary: (MyChart) Due to this being a telephonic visit, the after visit summary with patients personalized plan was offered to patient via MyChart   Nurse Notes: Patient is due for a flu vaccine.  I have sent some referrals out today on behalf of patient's PHQ9 report.  Patient stated that the medication prescribed is not work for her depression.   Patient is currently taking Citalopram  40 mg daily.  She had no other concerns to address today. "

## 2024-11-16 ENCOUNTER — Other Ambulatory Visit: Payer: Self-pay

## 2024-11-16 ENCOUNTER — Ambulatory Visit: Payer: Self-pay

## 2024-11-16 ENCOUNTER — Emergency Department (HOSPITAL_BASED_OUTPATIENT_CLINIC_OR_DEPARTMENT_OTHER)

## 2024-11-16 ENCOUNTER — Emergency Department (HOSPITAL_BASED_OUTPATIENT_CLINIC_OR_DEPARTMENT_OTHER)
Admission: EM | Admit: 2024-11-16 | Discharge: 2024-11-16 | Disposition: A | Discharge status: Home / Self Care | Attending: Emergency Medicine | Admitting: Emergency Medicine

## 2024-11-16 DIAGNOSIS — R519 Headache, unspecified: Secondary | ICD-10-CM | POA: Diagnosis not present

## 2024-11-16 DIAGNOSIS — I10 Essential (primary) hypertension: Secondary | ICD-10-CM | POA: Insufficient documentation

## 2024-11-16 DIAGNOSIS — E039 Hypothyroidism, unspecified: Secondary | ICD-10-CM | POA: Diagnosis not present

## 2024-11-16 DIAGNOSIS — M542 Cervicalgia: Secondary | ICD-10-CM | POA: Insufficient documentation

## 2024-11-16 DIAGNOSIS — K5900 Constipation, unspecified: Secondary | ICD-10-CM | POA: Insufficient documentation

## 2024-11-16 DIAGNOSIS — R109 Unspecified abdominal pain: Secondary | ICD-10-CM | POA: Diagnosis present

## 2024-11-16 DIAGNOSIS — Z8673 Personal history of transient ischemic attack (TIA), and cerebral infarction without residual deficits: Secondary | ICD-10-CM | POA: Diagnosis not present

## 2024-11-16 DIAGNOSIS — G8929 Other chronic pain: Secondary | ICD-10-CM | POA: Insufficient documentation

## 2024-11-16 DIAGNOSIS — R1032 Left lower quadrant pain: Secondary | ICD-10-CM

## 2024-11-16 LAB — CBC WITH DIFFERENTIAL/PLATELET
Abs Immature Granulocytes: 0.02 K/uL (ref 0.00–0.07)
Basophils Absolute: 0.1 K/uL (ref 0.0–0.1)
Basophils Relative: 1 %
Eosinophils Absolute: 0.4 K/uL (ref 0.0–0.5)
Eosinophils Relative: 6 %
HCT: 40.1 % (ref 36.0–46.0)
Hemoglobin: 13.7 g/dL (ref 12.0–15.0)
Immature Granulocytes: 0 %
Lymphocytes Relative: 30 %
Lymphs Abs: 2.1 K/uL (ref 0.7–4.0)
MCH: 33.7 pg (ref 26.0–34.0)
MCHC: 34.2 g/dL (ref 30.0–36.0)
MCV: 98.8 fL (ref 80.0–100.0)
Monocytes Absolute: 0.8 K/uL (ref 0.1–1.0)
Monocytes Relative: 11 %
Neutro Abs: 3.6 K/uL (ref 1.7–7.7)
Neutrophils Relative %: 52 %
Platelets: 289 K/uL (ref 150–400)
RBC: 4.06 MIL/uL (ref 3.87–5.11)
RDW: 12.5 % (ref 11.5–15.5)
WBC: 7 K/uL (ref 4.0–10.5)
nRBC: 0 % (ref 0.0–0.2)

## 2024-11-16 LAB — COMPREHENSIVE METABOLIC PANEL WITH GFR
ALT: 17 U/L (ref 0–44)
AST: 22 U/L (ref 15–41)
Albumin: 4.2 g/dL (ref 3.5–5.0)
Alkaline Phosphatase: 72 U/L (ref 38–126)
Anion gap: 9 (ref 5–15)
BUN: 16 mg/dL (ref 8–23)
CO2: 27 mmol/L (ref 22–32)
Calcium: 9.8 mg/dL (ref 8.9–10.3)
Chloride: 102 mmol/L (ref 98–111)
Creatinine, Ser: 0.6 mg/dL (ref 0.44–1.00)
GFR, Estimated: >60 mL/min
Glucose, Bld: 94 mg/dL (ref 70–99)
Potassium: 4.4 mmol/L (ref 3.5–5.1)
Sodium: 138 mmol/L (ref 135–145)
Total Bilirubin: 0.3 mg/dL (ref 0.0–1.2)
Total Protein: 7.6 g/dL (ref 6.5–8.1)

## 2024-11-16 LAB — URINALYSIS, W/ REFLEX TO CULTURE (INFECTION SUSPECTED)
Bacteria, UA: NONE SEEN
Bilirubin Urine: NEGATIVE
Glucose, UA: NEGATIVE mg/dL
Hgb urine dipstick: NEGATIVE
Ketones, ur: NEGATIVE mg/dL
Leukocytes,Ua: NEGATIVE
Nitrite: NEGATIVE
Protein, ur: NEGATIVE mg/dL
Specific Gravity, Urine: 1.007 (ref 1.005–1.030)
pH: 6 (ref 5.0–8.0)

## 2024-11-16 LAB — LIPASE, BLOOD: Lipase: 36 U/L (ref 11–51)

## 2024-11-16 LAB — OCCULT BLOOD X 1 CARD TO LAB, STOOL: Fecal Occult Bld: NEGATIVE

## 2024-11-16 MED ORDER — ACETAMINOPHEN 500 MG PO TABS
1000.0000 mg | ORAL_TABLET | Freq: Once | ORAL | Status: AC
  Administered 2024-11-16: 1000 mg via ORAL
  Filled 2024-11-16: qty 2

## 2024-11-16 MED ORDER — DICYCLOMINE HCL 10 MG PO CAPS
10.0000 mg | ORAL_CAPSULE | Freq: Once | ORAL | Status: AC
  Administered 2024-11-16: 10 mg via ORAL
  Filled 2024-11-16: qty 1

## 2024-11-16 NOTE — Telephone Encounter (Signed)
 FYI Only or Action Required?: FYI only for provider: ED advised.  Patient was last seen in primary care on 04/27/2024 by Norleen Lynwood ORN, MD.  Called Nurse Triage reporting Abdominal Pain.  Symptoms began today.  Interventions attempted: Nothing.  Symptoms are: stable.  Triage Disposition: Go to ED Now (Notify PCP)  Patient/caregiver understands and will follow disposition?:   Copied from CRM [id number]. Topic: Clinical - Red Word Triage >> Nov 16, 2024 12:43 PM Cindy Higgins wrote: Red Word that prompted transfer to Nurse Triage: rectal bleeding Oct 13 colonoscopy Pain lower left abd Reason for Disposition  [1] Constant abdominal pain AND [2] present > 2 hours  Answer Assessment - Initial Assessment Questions Patient said shes had a hemorrhoid that was full of blood. She states she is not dizzy or vomiting. Complains of nausea since colonoscopy 1. APPEARANCE of BLOOD: What color is it? Is it passed separately, on the surface of the stool, or mixed in with the stool?      Water full of blood per patient. Had to apply pressure to stop bleeding 3. FREQUENCY: How many times has blood been passed with the stools?      1 time Questions expedited due to answers to emergent questions. ED advised. Patient says she will talk it over with her husband and decide.  Protocols used: Rectal Bleeding-A-AH

## 2024-11-16 NOTE — Discharge Instructions (Signed)
 Take an aspirin  a day.  Call the neurologist to schedule an appointment.  Call your gastroenterologist to schedule follow-up as well for your constipation

## 2024-11-16 NOTE — ED Provider Notes (Signed)
 " [facility] AT [facility] Provider Note   CSN: [id number] Arrival date & time: 11/16/24  1355     History Chief Complaint  Patient presents with   Abdominal Pain    HPI: Cindy Higgins is a 79 y.o. female with history pertinent for hypercholesterolemia, glaucoma, anxiety, osteoporosis, hypothyroidism, dysthymia, hypertension, atrophic vaginitis who presents complaining of multiple complaints. Patient arrived via POV accompanied by husband, [name].  History provided by patient and spouse/partner.  No interpreter required during this encounter.  Patient reports that she has had daily abdominal pain since undergoing a colonoscopy in October 2025.  Reports that she had chronic diarrhea prior to the colonoscopy, but now has formed stools with occasional loose stools, however reports that she has had daily abdominal pain, pain worsens prior to her bowel movement, is partially alleviated by bowel movements. Reports that she does have a prior history of hemorrhoids, reports that she did have bright red blood per rectum several days ago, however since has had normal stools.  Denies use of anticoagulants or antiplatelets.  Reports that she has chronic nausea, denies vomiting.  Denies fevers, chills, chest pain, shortness of breath.  Reports that she also has had a constant chronic headache as well as stiff neck since a mechanical trip and fall while visiting her son's new house in November 2025.  Patient's recorded medical, surgical, social, medication list and allergies were reviewed in the Snapshot window as part of the initial history.   Prior to Admission medications  Medication Sig Start Date End Date Taking? Authorizing Provider  ALPRAZolam  (XANAX ) 0.5 MG tablet Take 1 tablet (0.5 mg total) by mouth 2 (two) times daily as needed. 04/27/24   Norleen Lynwood ORN, MD  buPROPion  (WELLBUTRIN  XL) 150 MG 24 hr tablet Take 1 tablet (150 mg total) by mouth daily. Patient not taking:  Reported on 11/12/2024 04/27/24   Norleen Lynwood ORN, MD  calcium -vitamin D  RONNETTE WITH D) 250-125 MG-UNIT tablet Take 1 tablet by mouth daily.    [provider]  citalopram  (CELEXA ) 40 MG tablet Take 1 tablet (40 mg total) by mouth daily. 04/27/24   Norleen Lynwood ORN, MD  cyanocobalamin  (VITAMIN B12) 1000 MCG tablet Take 1,000 mcg by mouth daily.    [provider]  doxycycline (VIBRA-TABS) 100 MG tablet Take by mouth. Patient not taking: Reported on 11/12/2024 04/12/24   [provider]  estradiol (ESTRACE) 0.1 MG/GM vaginal cream Place 1 Applicatorful vaginally at bedtime.    [provider]  estradiol (ESTRACE) 0.5 MG tablet Take 0.5 mg by mouth daily. 11/23/16   [provider]  levothyroxine  (SYNTHROID ) 25 MCG tablet Take 1 tablet (25 mcg total) by mouth daily before breakfast. 04/27/24   Norleen Lynwood ORN, MD  meclizine  (ANTIVERT ) 25 MG tablet Take 1 tablet (25 mg total) by mouth 3 (three) times daily as needed for dizziness. Patient not taking: Reported on 11/12/2024 10/27/23   Norleen Lynwood ORN, MD  tacrolimus (PROTOPIC) 0.1 % ointment Apply topically 2 (two) times daily. Patient not taking: Reported on 11/12/2024 02/09/24   [provider]  timolol (TIMOPTIC) 0.5 % ophthalmic solution Place 1 drop into both eyes 2 (two) times daily. 05/12/22   [provider]  vitamin E 45 MG (100 UNITS) capsule Take 100 Units by mouth daily.    [provider]     Allergies: Sulfa antibiotics, Codeine, Iodinated contrast media, Iodine, Nsaids, Other, Penicillins, Shingrix [zoster vac recomb adjuvanted], Statins, and Zetia  [ezetimibe ]  Review of Systems   ROS as per HPI  Physical Exam Updated Vital Signs BP (!) 150/74   Pulse 92   Temp (!) 97.2 F (36.2 C)   Resp 16   SpO2 95%  Physical Exam Vitals and nursing note reviewed.  Constitutional:      General: She is not in acute distress.    Appearance: She is well-developed.  HENT:     Head:  Normocephalic and atraumatic.  Eyes:     Conjunctiva/sclera: Conjunctivae normal.  Cardiovascular:     Rate and Rhythm: Normal rate and regular rhythm.     Heart sounds: No murmur heard. Pulmonary:     Effort: Pulmonary effort is normal. No respiratory distress.     Breath sounds: Normal breath sounds.  Abdominal:     Palpations: Abdomen is soft.     Tenderness: There is abdominal tenderness in the left lower quadrant.  Musculoskeletal:        General: No swelling.     Cervical back: Neck supple. No bony tenderness.     Thoracic back: No bony tenderness.     Lumbar back: No bony tenderness.  Skin:    General: Skin is warm and dry.     Capillary Refill: Capillary refill takes less than 2 seconds.  Neurological:     Mental Status: She is alert.  Psychiatric:        Mood and Affect: Mood normal.     ED Course/ Medical Decision Making/ A&P    Procedures Procedures   Medications Ordered in ED Medications  acetaminophen  (TYLENOL ) tablet 1,000 mg (1,000 mg Oral Given 11/16/24 1517)  dicyclomine  (BENTYL ) capsule 10 mg (10 mg Oral Given 11/16/24 1517)    Medical Decision Making:   Cindy Higgins is a 79 y.o. female who presents for multiple complaints as per above.  Physical exam is pertinent for lower quadrant tenderness to palpation.   The differential includes but is not limited to ICH, spinal fracture, hemorrhoid, constipation, musculoskeletal abdominal pain, diverticulitis.  Independent historian: {LSHISTORIAN:33403}  External data reviewed: {LSEXTERNALDATA:33407}  Initial Plan:  ***Screening labs including CBC and Metabolic panel to evaluate for infectious or metabolic etiology of disease.  *** ***Urinalysis with reflex culture ordered to evaluate for UTI or relevant urologic/nephrologic pathology.  *** to evaluate for structural/infectious intra-*** pathology.  {crccardiactesting:32591::EKG to evaluate for cardiac pathology} Objective evaluation as below reviewed    Labs: {LSLABS:33416}  Radiology: {LSRADS:33417} No results found.  EKG/Medicine tests: {LSEKG:33414} EKG Interpretation:    Decision rules:  {Document cardiac monitor, telemetry assessment procedure when appropriate:1} {   Click here for ABCD2, HEART and other calculatorsREFRESH Note before signing :1}   {Document critical care time when appropriate:1} {Document review of labs and clinical decision tools ie heart score, Chads2Vasc2 etc:1}  {Document your independent review of radiology images, and any outside records:1} {Document your discussion with family members, caretakers, and with consultants:1} {Document social determinants of health affecting pt's care:1} {Document your decision making why or why not admission, treatments were needed:1}                Interventions:***   See the EMR for full details regarding lab and imaging results.  ***  {LSCOPA:33420}  Discussion of management or test interpretations with external provider(s): ***  Risk Drugs:{LSDRUGS:33399} Treatment: {LSTREATMENT:33409} Surgery:{LSSURGERY:33410} Critical Care: ***  Disposition: {LSDISPO:33388}  MDM generated using voice dictation software and may contain dictation errors.  Please contact me for any clarification or with any questions.  Clinical  Impression:  1. LLQ pain   2. Chronic neck pain   3. Chronic nonintractable headache, unspecified headache type      Data Unavailable   Final Clinical Impression(s) / ED Diagnoses Final diagnoses:  LLQ pain  Chronic neck pain  Chronic nonintractable headache, unspecified headache type    Rx / DC Orders ED Discharge Orders     None      "

## 2024-11-16 NOTE — ED Provider Notes (Signed)
 Patient signed out to me by Dr. Rogelia pending results of CTs.  Patient's abdominal CT did show positive constipation.  Head CT showed a remote lacunar infarct which is new since 2024.  Patient has had no neurological symptoms at this time.  Will place patient on aspirin  and have her follow-up with neurologist   Dasie Faden, MD 11/16/24 [phone]

## 2024-11-16 NOTE — ED Triage Notes (Signed)
 Patient states left lower abdominal pain since colonoscopy in October. States bright red blood in stool several days ago.

## 2024-11-19 ENCOUNTER — Telehealth: Payer: Self-pay | Admitting: Gastroenterology

## 2024-11-19 NOTE — Telephone Encounter (Signed)
 PT had a colonoscopy back in October and her symptoms are now worse. She stated that she has seen bright red blood in stools as well. Requesting to speak with a nurse regarding her symptoms.

## 2024-11-19 NOTE — Telephone Encounter (Signed)
 Left message on machine to call back

## 2024-11-20 NOTE — Telephone Encounter (Signed)
 Left message on machine to call back

## 2024-11-21 ENCOUNTER — Encounter: Payer: Self-pay | Admitting: Internal Medicine

## 2024-11-21 ENCOUNTER — Ambulatory Visit (INDEPENDENT_AMBULATORY_CARE_PROVIDER_SITE_OTHER): Admitting: Internal Medicine

## 2024-11-21 VITALS — BP 120/80 | HR 87 | Temp 98.0°F | Ht 64.0 in | Wt 138.5 lb

## 2024-11-21 DIAGNOSIS — K5900 Constipation, unspecified: Secondary | ICD-10-CM | POA: Diagnosis not present

## 2024-11-21 DIAGNOSIS — I10 Essential (primary) hypertension: Secondary | ICD-10-CM

## 2024-11-21 DIAGNOSIS — Z8673 Personal history of transient ischemic attack (TIA), and cerebral infarction without residual deficits: Secondary | ICD-10-CM

## 2024-11-21 DIAGNOSIS — I6381 Other cerebral infarction due to occlusion or stenosis of small artery: Secondary | ICD-10-CM | POA: Insufficient documentation

## 2024-11-21 DIAGNOSIS — E559 Vitamin D deficiency, unspecified: Secondary | ICD-10-CM

## 2024-11-21 NOTE — Assessment & Plan Note (Signed)
 Stable, pt to continue asa 81 mg, and refer neurology as reqeusted

## 2024-11-21 NOTE — Assessment & Plan Note (Signed)
 Last vitamin D  Lab Results  Component Value Date   VD25OH 33.47 04/27/2024   Low, to start oral replacement

## 2024-11-21 NOTE — Telephone Encounter (Signed)
 The pt states that she spoke with her PCP and nothing further is needed at this time

## 2024-11-21 NOTE — Assessment & Plan Note (Signed)
 BP Readings from Last 3 Encounters:  11/21/24 120/80  11/16/24 (!) 150/74  08/20/24 (!) 112/51   Stable, pt to continue medical treatment  - diet, wt control

## 2024-11-21 NOTE — Patient Instructions (Signed)
 Please continue all other medications as before, including the aspirin   Ok to start Miralax OTC 17 gm per day  Please have the pharmacy call with any other refills you may need.  Please continue your efforts at being more active, low cholesterol diet, and weight control.  Please keep your appointments with your specialists as you may have planned  You will be contacted regarding the referral for: Neurology  Please make an Appointment to return in 3 months, or sooner if needed

## 2024-11-21 NOTE — Progress Notes (Signed)
 Patient ID: Cindy Higgins, female   DOB: Oct 05, 1946, 79 y.o.   MRN: [id number]        Chief Complaint: follow up history of lacunar infarct, constipation, htn, low vit d       HPI:  Cindy Higgins is a 79 y.o. female here after seen in ED  1/9 with abd pain, CT c/w constipation with improvement with dulcolox po yesterday, feels as if she may need more but this was too harsh.  Denies worsening reflux, abd pain, dysphagia, n/v, bowel change or blood.  Pt denies chest pain, increased sob or doe, wheezing, orthopnea, PND, increased LE swelling, palpitations, dizziness or syncope.   Pt denies polydipsia, polyuria, or new focal neuro s/s, despite recent CT evidence for lacunar infarct, started on ASA 81 mg every day, and asking for neurology referral.        Wt Readings from Last 3 Encounters:  11/21/24 138 lb 8 oz (62.8 kg)  11/12/24 138 lb (62.6 kg)  08/20/24 138 lb 7.2 oz (62.8 kg)   BP Readings from Last 3 Encounters:  11/21/24 120/80  11/16/24 (!) 150/74  08/20/24 (!) 112/51         Past Medical History:  Diagnosis Date   ALLERGIC RHINITIS 12/22/2007   Anxiety    ANXIETY DEPRESSION 08/30/2007   Arthritis    Left thumb bone on bone   BLURRED VISION, INTERMITTENT 12/22/2007   COLONIC POLYPS, HX OF 12/22/2007   01/18/2006   Difficult airway for intubation    Pt states anesthesia told her and sent a letter that she was a difficult airway after her surgey 10 years ago.   Glaucoma    NTG Glaucoma   GLUCOSE INTOLERANCE 07/08/2009   Headache(784.0) 12/22/2007   Herpes zoster of eye 05/10/2011   HYPERCHOLESTEROLEMIA 08/30/2007   HYPERLIPIDEMIA 03/26/2008   Hypothyroidism 07/24/2020   Impaired glucose tolerance 05/09/2011   MITRAL VALVE PROLAPSE, HX OF 08/30/2007   OSTEOPOROSIS 12/22/2007   POSTMENOPAUSAL STATUS 08/30/2007   SINUSITIS- ACUTE-NOS 02/06/2010   Past Surgical History:  Procedure Laterality Date   ABDOMINAL HYSTERECTOMY     AUGMENTATION MAMMAPLASTY     COLONOSCOPY      COLONOSCOPY N/A 08/20/2024   Procedure: COLONOSCOPY;  Surgeon: Legrand Victory LITTIE DOUGLAS, MD;  Location: [facility];  Service: Gastroenterology;  Laterality: N/A;   COSMETIC SURGERY     DILATION AND CURETTAGE OF UTERUS  2007   OOPHORECTOMY     POLYPECTOMY      reports that she has never smoked. She has never used smokeless tobacco. She reports that she does not currently use alcohol after a past usage of about 2.0 standard drinks of alcohol per week. She reports that she does not use drugs. family history includes Alcohol abuse in her father; Breast cancer in her maternal grandmother, sister, and sister; Cancer in her sister; Diabetes in her mother; Heart disease in her mother; Hyperlipidemia in her mother; Testicular cancer in her son. Allergies[1] Medications Ordered Prior to Encounter[2]      ROS:  All others reviewed and negative.  Objective        PE:  BP 120/80   Pulse 87   Temp 98 F (36.7 C) (Temporal)   Ht 5' 4 (1.626 m)   Wt 138 lb 8 oz (62.8 kg)   SpO2 97%   BMI 23.77 kg/m                 Constitutional: Pt appears in NAD  HENT: Head: NCAT.                Right Ear: External ear normal.                 Left Ear: External ear normal.                Eyes: . Pupils are equal, round, and reactive to light. Conjunctivae and EOM are normal               Nose: without d/c or deformity               Neck: Neck supple. Gross normal ROM               Cardiovascular: Normal rate and regular rhythm.                 Pulmonary/Chest: Effort normal and breath sounds without rales or wheezing.                Abd:  Soft, NT, ND, + BS, no organomegaly               Neurological: Pt is alert. At baseline orientation, motor grossly intact               Skin: Skin is warm. No rashes, no other new lesions, LE edema - none               Psychiatric: Pt behavior is normal without agitation   Micro: none  Cardiac tracings I have personally interpreted today:  none  Pertinent  Radiological findings (summarize): none   Lab Results  Component Value Date   WBC 7.0 11/16/2024   HGB 13.7 11/16/2024   HCT 40.1 11/16/2024   PLT 289 11/16/2024   GLUCOSE 94 11/16/2024   CHOL 229 (H) 04/27/2024   TRIG 205.0 (H) 04/27/2024   HDL 62.50 04/27/2024   LDLDIRECT 175.0 04/27/2023   LDLCALC 126 (H) 04/27/2024   ALT 17 11/16/2024   AST 22 11/16/2024   NA 138 11/16/2024   K 4.4 11/16/2024   CL 102 11/16/2024   CREATININE 0.60 11/16/2024   BUN 16 11/16/2024   CO2 27 11/16/2024   TSH 3.46 04/27/2024   INR 0.9 10/21/2023   HGBA1C 5.8 04/27/2024   Assessment/Plan:  Cindy Higgins is a 79 y.o. White or Caucasian [1] female with  has a past medical history of ALLERGIC RHINITIS (12/22/2007), Anxiety, ANXIETY DEPRESSION (08/30/2007), Arthritis, BLURRED VISION, INTERMITTENT (12/22/2007), COLONIC POLYPS, HX OF (12/22/2007), Difficult airway for intubation, Glaucoma, GLUCOSE INTOLERANCE (07/08/2009), Headache(784.0) (12/22/2007), Herpes zoster of eye (05/10/2011), HYPERCHOLESTEROLEMIA (08/30/2007), HYPERLIPIDEMIA (03/26/2008), Hypothyroidism (07/24/2020), Impaired glucose tolerance (05/09/2011), MITRAL VALVE PROLAPSE, HX OF (08/30/2007), OSTEOPOROSIS (12/22/2007), POSTMENOPAUSAL STATUS (08/30/2007), and SINUSITIS- ACUTE-NOS (02/06/2010).  Vitamin d  deficiency Last vitamin D  Lab Results  Component Value Date   VD25OH 33.47 04/27/2024   Low, to start oral replacement   Lacunar stroke (HCC) Stable, pt to continue asa 81 mg, and refer neurology as reqeusted  Constipation Mild persistent, for miralax 17 po every day prn  Hypertension, uncontrolled BP Readings from Last 3 Encounters:  11/21/24 120/80  11/16/24 (!) 150/74  08/20/24 (!) 112/51   Stable, pt to continue medical treatment  - diet, wt control   Followup: Return in about 3 months (around 02/19/2025).  Lynwood Rush, MD 11/21/2024 7:56 PM [facility] Internal  Medicine     [1]  Allergies Allergen Reactions  Sulfa Antibiotics Anaphylaxis   Codeine Itching and Rash   Iodinated Contrast Media Hives   Iodine Other (See Comments)    Burning   Nsaids Other (See Comments)    Petechiae to arms   Other Other (See Comments)    STEROIDS- contraindication per patient   Penicillins Itching    Has patient had a PCN reaction causing immediate rash, facial/tongue/throat swelling, SOB or lightheadedness with hypotension:  Unknown think it was rash Has patient had a PCN reaction causing severe rash involving mucus membranes or skin necrosis: unknown childhood Has patient had a PCN reaction that required hospitalization: unknown Has patient had a PCN reaction occurring within the last 10 years: no childhood If all of the above answers are NO, then may proceed with Cephalosporin use.     Shingrix [Zoster Vac Recomb Adjuvanted] Rash    Severe arm erythema and heat   Statins Other (See Comments)   Zetia  [Ezetimibe ] Diarrhea  [2]  Current Outpatient Medications on File Prior to Visit  Medication Sig Dispense Refill   ALPRAZolam  (XANAX ) 0.5 MG tablet Take 1 tablet (0.5 mg total) by mouth 2 (two) times daily as needed. 60 tablet 2   calcium -vitamin D  (OSCAL WITH D) 250-125 MG-UNIT tablet Take 1 tablet by mouth daily.     citalopram  (CELEXA ) 40 MG tablet Take 1 tablet (40 mg total) by mouth daily. 90 tablet 3   cyanocobalamin  (VITAMIN B12) 1000 MCG tablet Take 1,000 mcg by mouth daily.     estradiol (ESTRACE) 0.1 MG/GM vaginal cream Place 1 Applicatorful vaginally at bedtime.     estradiol (ESTRACE) 0.5 MG tablet Take 0.5 mg by mouth daily.     levothyroxine  (SYNTHROID ) 25 MCG tablet Take 1 tablet (25 mcg total) by mouth daily before breakfast. 90 tablet 3   timolol (TIMOPTIC) 0.5 % ophthalmic solution Place 1 drop into both eyes 2 (two) times daily.     vitamin E 45 MG (100 UNITS) capsule Take 100 Units by mouth daily.     buPROPion  (WELLBUTRIN  XL) 150 MG  24 hr tablet Take 1 tablet (150 mg total) by mouth daily. (Patient not taking: Reported on 11/21/2024) 90 tablet 3   doxycycline (VIBRA-TABS) 100 MG tablet Take by mouth. (Patient not taking: Reported on 11/21/2024)     meclizine  (ANTIVERT ) 25 MG tablet Take 1 tablet (25 mg total) by mouth 3 (three) times daily as needed for dizziness. (Patient not taking: Reported on 11/21/2024) 60 tablet 0   tacrolimus (PROTOPIC) 0.1 % ointment Apply topically 2 (two) times daily. (Patient not taking: Reported on 11/21/2024)     Current Facility-Administered Medications on File Prior to Visit  Medication Dose Route Frequency Provider Last Rate Last Admin   0.9 %  sodium chloride  infusion  500 mL Intravenous Once Danis, Victory LITTIE MOULD, MD

## 2024-11-21 NOTE — Assessment & Plan Note (Signed)
 Mild persistent, for miralax 17 po every day prn

## 2024-11-27 DIAGNOSIS — L719 Rosacea, unspecified: Secondary | ICD-10-CM | POA: Insufficient documentation

## 2024-11-29 ENCOUNTER — Ambulatory Visit (INDEPENDENT_AMBULATORY_CARE_PROVIDER_SITE_OTHER): Admitting: Student in an Organized Health Care Education/Training Program

## 2024-11-29 ENCOUNTER — Encounter: Payer: Self-pay | Admitting: Student in an Organized Health Care Education/Training Program

## 2024-11-29 VITALS — BP 152/73 | HR 88 | Temp 97.9°F | Ht 64.0 in | Wt 137.0 lb

## 2024-11-29 DIAGNOSIS — E538 Deficiency of other specified B group vitamins: Secondary | ICD-10-CM | POA: Diagnosis not present

## 2024-11-29 DIAGNOSIS — K5900 Constipation, unspecified: Secondary | ICD-10-CM | POA: Diagnosis not present

## 2024-11-29 DIAGNOSIS — I10 Essential (primary) hypertension: Secondary | ICD-10-CM

## 2024-11-29 DIAGNOSIS — F411 Generalized anxiety disorder: Secondary | ICD-10-CM | POA: Diagnosis not present

## 2024-11-29 MED ORDER — ALPRAZOLAM 0.5 MG PO TABS: 0.5000 mg | ORAL_TABLET | Freq: Every day | ORAL | 0 refills | Status: AC | PRN

## 2024-11-29 MED ORDER — VITAMIN B-12 1000 MCG PO TABS: 1000.0000 ug | ORAL_TABLET | Freq: Every day | ORAL | 3 refills | Status: AC

## 2024-11-29 NOTE — Patient Instructions (Signed)
" °  VISIT SUMMARY: During your visit, we discussed your recent rectal bleeding, stiff neck, and other health concerns. We reviewed your history of glaucoma, high blood pressure, familial hypercholesterolemia, and recent fall. We also addressed your mood and anxiety issues related to personal family matters.  YOUR PLAN: -LACUNAR INFARCT: A lacunar infarct is a type of small stroke caused by a blockage in one of the small arteries deep within the brain. We are awaiting your neurology appointment for further evaluation and will consider starting amlodipine  for blood pressure management after the consultation.  -HYPERTENSION: Hypertension, or high blood pressure, can increase the risk of strokes. Your blood pressure is currently 152/73 mmHg. We discussed the potential need for antihypertensive medication and will consider starting amlodipine  after your neurology consultation.  -GENERALIZED ANXIETY DISORDER: Generalized anxiety disorder involves persistent and excessive worry about various aspects of life. You are currently taking alprazolam  0.5 mg once daily. We discussed the risks of long-term benzodiazepine use in older adults and a potential tapering strategy. You should cut the tablets in half to taper the dose, and we may consider prescribing alprazolam  0.25 mg if needed.  -DEPRESSION: Depression is a mood disorder characterized by persistent feelings of sadness and loss of interest. Your previous trial of Wellbutrin  resulted in hallucinations, so it was discontinued.  -OSTEOARTHRITIS OF HAND: Osteoarthritis is a condition that causes the cartilage in your joints to wear down over time. You have chronic osteoarthritis in your hands, particularly at the base of your thumb. While there is no cure, your symptoms are being managed.  -CONSTIPATION: Constipation is a condition where you have difficulty passing stools. Your recent episode of constipation with associated bleeding resolved with laxative use. We  recommend starting Metamucil for constipation management.  -PURE HYPERCHOLESTEROLEMIA: Familial hypercholesterolemia is a genetic condition that causes high cholesterol levels. Despite previous trials of statins being unsuccessful, we discussed the impact of cholesterol on stroke risk. You prefer not to start statin therapy, so we will continue monitoring your cholesterol levels without it.  INSTRUCTIONS: Please follow up with the neurology appointment for further evaluation of your lacunar infarct. We will consider starting amlodipine  for blood pressure management after the consultation. Continue taking alprazolam  as prescribed and follow the tapering strategy discussed. Start Metamucil for constipation management. We will continue to monitor your cholesterol levels without statin therapy.    Contains text generated by [employer].   "

## 2024-11-29 NOTE — Assessment & Plan Note (Signed)
 Chronic and stable anxiety that is been treated for many years with citalopram  40 mg daily and alprazolam  0.5 mg once daily.  She denies have any side effects of the Xanax , did have 1 fall recently at her son's house.  She is increasing frailty and advanced age.  She is open to trying to come off this medication.  We talked about switching to a half a tablet a day.  I refilled the Xanax  0.5 mg for 30 tablets and we will see how she does over the next 4 weeks.  I reviewed the database which was appropriate.

## 2024-11-29 NOTE — Assessment & Plan Note (Signed)
 Intermittent issue.  Blood pressure elevated today systolic but diastolic a little low.  She has had some varying readings over the years with her primary care physician.  Not on antihypertensive medications.  She has moderate frailty on exam.  Recent evidence of a lacunar infarction on CT was incidental, may indicate some benefit to blood pressure lowering.  I recommended home blood pressure monitoring, follow-up in 4 weeks.  Could offer her amlodipine  in the future, but she is hesitant to start an medication right now.

## 2024-11-29 NOTE — Progress Notes (Signed)
 "  Established Patient Office Visit  Patient ID: Cindy Higgins, female    DOB: 09/18/1946  Age: 79 y.o. MRN: [id number] PCP: Jerrell Cleatus Ned, MD  Chief Complaint  Patient presents with   Transfer of Care    Patient states she needs a refill on vitamin b12.     Subjective:     HPI  Discussed the use of AI scribe software for clinical note transcription with the patient, who gave verbal consent to proceed.  History of Present Illness Cindy Higgins is a 79 year old female who presents with recent bleeding during bowel movements and a stiff neck.  She experienced significant rectal bleeding on November 16, 2024, which prompted a visit to the emergency department. The bleeding was substantial, filling the toilet bowl, and was accompanied by a stiff neck and headache. A CT scan at that time revealed a lacunar stroke. The bleeding was attributed to constipation, which resolved after taking a laxative. No further episodes of bleeding have occurred since then.  She has a history of severe glaucoma, managed by a specialist, with well-controlled intraocular pressures. She reports having high blood pressure when she comes to the doctor's office and is not on any antihypertensive medications.  In 2025, she underwent a colonoscopy during which two polyps were removed, and biopsies for microscopic colitis were negative. She occasionally experiences pain in the rectal area, and her previous doctor suggested this may be due to incomplete bowel evacuation.  Her past medical history includes familial hypercholesterolemia. She has tried several statins but is not currently taking any due to previous negative experiences. She has had two negative heart scans and an LPA test.  She reports a fall on October 01, 2024, due to tripping over a lip in a driveway.  She is currently taking citalopram  and Xanax  for mood, with Xanax  used once daily in the morning. She has a history of using Wellbutrin , which  was discontinued due to hallucinations. She has concerns about her mood, particularly related to personal issues with her son and his wife, leading to estrangement.  She lives with her husband, who is diabetic but active. She has two sons, one in [place]  and one in [place] , and a grandson who is 58 years old. She mentions a strained relationship with her daughter-in-law, affecting her ability to see her grandchildren.  No current bleeding, nausea, or headaches. Mood is affected by personal family issues.    Objective:     BP (!) 152/73   Pulse 88   Temp 97.9 F (36.6 C) (Oral)   Ht 5' 4 (1.626 m)   Wt 137 lb (62.1 kg)   SpO2 97%   BMI 23.52 kg/m   Physical Exam  Gen: Well-appearing woman, moderate frailty of advanced age Ears: Normal hearing, moderate cerumen in the right ear, left ear is clear with normal tympanic membrane Neck: Normal thyroid , no nodules or adenopathy Heart: Regular, no murmur Lungs: Unlabored, clear throughout Abd: Soft and nontender Ext: Warm, no edema Psych: Appropriate mood and affect, speech is a little tangential, but pleasant to talk with, calm, not anxious or depressed.    Assessment & Plan:   Problem List Items Addressed This Visit       High   Hypertension, uncontrolled (Chronic)   Intermittent issue.  Blood pressure elevated today systolic but diastolic a little low.  She has had some varying readings over the years with her primary care physician.  Not on antihypertensive medications.  She has  moderate frailty on exam.  Recent evidence of a lacunar infarction on CT was incidental, may indicate some benefit to blood pressure lowering.  I recommended home blood pressure monitoring, follow-up in 4 weeks.  Could offer her amlodipine  in the future, but she is hesitant to start an medication right now.        Medium    Generalized anxiety disorder - Primary (Chronic)   Chronic and stable anxiety that is been treated for many years with citalopram   40 mg daily and alprazolam  0.5 mg once daily.  She denies have any side effects of the Xanax , did have 1 fall recently at her son's house.  She is increasing frailty and advanced age.  She is open to trying to come off this medication.  We talked about switching to a half a tablet a day.  I refilled the Xanax  0.5 mg for 30 tablets and we will see how she does over the next 4 weeks.  I reviewed the database which was appropriate.      Relevant Medications   ALPRAZolam  (XANAX ) 0.5 MG tablet     Low   Constipation (Chronic)   Chronic issue.  Recently had an ED visit for hematochezia that sounded pretty low risk.  She has had no further episodes of bleeding.  Had a colonoscopy only about 3 months ago that was very reassuring with 2 polyps removed but no diverticulosis and no hemorrhoids seen.  Responding very well to over-the-counter stool softeners and supplementing fiber which I think is a good idea.      B12 deficiency (Chronic)   Relevant Medications   cyanocobalamin  (VITAMIN B12) 1000 MCG tablet    Return in about 4 weeks (around 12/27/2024).    Cleatus Debby Specking, MD [facility]   "

## 2024-11-29 NOTE — Assessment & Plan Note (Signed)
 Chronic issue.  Recently had an ED visit for hematochezia that sounded pretty low risk.  She has had no further episodes of bleeding.  Had a colonoscopy only about 3 months ago that was very reassuring with 2 polyps removed but no diverticulosis and no hemorrhoids seen.  Responding very well to over-the-counter stool softeners and supplementing fiber which I think is a good idea.

## 2024-12-27 ENCOUNTER — Ambulatory Visit: Admitting: Student in an Organized Health Care Education/Training Program

## 2025-01-17 ENCOUNTER — Encounter: Admitting: Family

## 2025-01-23 ENCOUNTER — Ambulatory Visit: Payer: Self-pay | Admitting: Neurology

## 2025-04-29 ENCOUNTER — Encounter: Admitting: Internal Medicine
